# Patient Record
Sex: Female | Born: 1970 | Race: Black or African American | Hispanic: No | Marital: Single | Smoking: Former smoker
Health system: Southern US, Community
[De-identification: ages and names within clinical notes are randomized; demographics above are authoritative.]

## PROBLEM LIST (undated history)

## (undated) DIAGNOSIS — I10 Essential (primary) hypertension: Secondary | ICD-10-CM

## (undated) DIAGNOSIS — N898 Other specified noninflammatory disorders of vagina: Secondary | ICD-10-CM

## (undated) DIAGNOSIS — N949 Unspecified condition associated with female genital organs and menstrual cycle: Secondary | ICD-10-CM

## (undated) DIAGNOSIS — R87629 Unspecified abnormal cytological findings in specimens from vagina: Secondary | ICD-10-CM

## (undated) DIAGNOSIS — N200 Calculus of kidney: Secondary | ICD-10-CM

## (undated) DIAGNOSIS — F419 Anxiety disorder, unspecified: Secondary | ICD-10-CM

## (undated) DIAGNOSIS — F99 Mental disorder, not otherwise specified: Secondary | ICD-10-CM

## (undated) DIAGNOSIS — F32A Depression, unspecified: Secondary | ICD-10-CM

## (undated) DIAGNOSIS — N760 Acute vaginitis: Secondary | ICD-10-CM

## (undated) DIAGNOSIS — D219 Benign neoplasm of connective and other soft tissue, unspecified: Secondary | ICD-10-CM

## (undated) DIAGNOSIS — B9689 Other specified bacterial agents as the cause of diseases classified elsewhere: Secondary | ICD-10-CM

## (undated) DIAGNOSIS — E785 Hyperlipidemia, unspecified: Secondary | ICD-10-CM

## (undated) DIAGNOSIS — N921 Excessive and frequent menstruation with irregular cycle: Secondary | ICD-10-CM

## (undated) DIAGNOSIS — F339 Major depressive disorder, recurrent, unspecified: Secondary | ICD-10-CM

## (undated) HISTORY — DX: Unspecified abnormal cytological findings in specimens from vagina: R87.629

## (undated) HISTORY — DX: Other specified noninflammatory disorders of vagina: N89.8

## (undated) HISTORY — DX: Acute vaginitis: N76.0

## (undated) HISTORY — PX: OTHER SURGICAL HISTORY: SHX169

## (undated) HISTORY — DX: Depression, unspecified: F32.A

## (undated) HISTORY — DX: Mental disorder, not otherwise specified: F99

## (undated) HISTORY — DX: Excessive and frequent menstruation with irregular cycle: N92.1

## (undated) HISTORY — DX: Unspecified condition associated with female genital organs and menstrual cycle: N94.9

## (undated) HISTORY — DX: Calculus of kidney: N20.0

## (undated) HISTORY — DX: Other specified bacterial agents as the cause of diseases classified elsewhere: B96.89

## (undated) HISTORY — DX: Benign neoplasm of connective and other soft tissue, unspecified: D21.9

## (undated) HISTORY — DX: Hyperlipidemia, unspecified: E78.5

## (undated) HISTORY — DX: Anxiety disorder, unspecified: F41.9

## (undated) HISTORY — DX: Essential (primary) hypertension: I10

---
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
## 2005-08-27 HISTORY — PX: BREAST REDUCTION SURGERY: SHX8

## 2005-10-12 ENCOUNTER — Emergency Department (HOSPITAL_COMMUNITY): Admission: EM | Admit: 2005-10-12 | Discharge: 2005-10-12 | Payer: Self-pay | Admitting: Emergency Medicine

## 2007-09-30 ENCOUNTER — Encounter: Payer: Self-pay | Admitting: Family Medicine

## 2007-10-09 ENCOUNTER — Encounter: Payer: Self-pay | Admitting: Family Medicine

## 2008-08-27 HISTORY — PX: CERVICAL BIOPSY: SHX590

## 2008-10-28 ENCOUNTER — Encounter: Payer: Self-pay | Admitting: Family Medicine

## 2008-12-09 ENCOUNTER — Encounter: Payer: Self-pay | Admitting: Family Medicine

## 2009-02-22 ENCOUNTER — Other Ambulatory Visit: Admission: RE | Admit: 2009-02-22 | Discharge: 2009-02-22 | Payer: Self-pay | Admitting: Nurse Practitioner

## 2009-02-22 ENCOUNTER — Encounter: Payer: Self-pay | Admitting: Family Medicine

## 2009-02-22 ENCOUNTER — Other Ambulatory Visit: Admission: RE | Admit: 2009-02-22 | Discharge: 2009-02-22 | Payer: Self-pay | Admitting: Unknown Physician Specialty

## 2009-06-06 ENCOUNTER — Ambulatory Visit (HOSPITAL_COMMUNITY): Admission: RE | Admit: 2009-06-06 | Discharge: 2009-06-06 | Payer: Self-pay | Admitting: Family Medicine

## 2009-06-06 ENCOUNTER — Ambulatory Visit: Payer: Self-pay | Admitting: Family Medicine

## 2009-06-06 DIAGNOSIS — G4700 Insomnia, unspecified: Secondary | ICD-10-CM | POA: Insufficient documentation

## 2009-06-06 DIAGNOSIS — K5900 Constipation, unspecified: Secondary | ICD-10-CM | POA: Insufficient documentation

## 2009-06-06 DIAGNOSIS — F17200 Nicotine dependence, unspecified, uncomplicated: Secondary | ICD-10-CM | POA: Insufficient documentation

## 2009-06-06 DIAGNOSIS — M549 Dorsalgia, unspecified: Secondary | ICD-10-CM | POA: Insufficient documentation

## 2009-06-06 DIAGNOSIS — M79609 Pain in unspecified limb: Secondary | ICD-10-CM | POA: Insufficient documentation

## 2009-06-30 ENCOUNTER — Encounter: Payer: Self-pay | Admitting: Family Medicine

## 2009-07-27 ENCOUNTER — Ambulatory Visit: Payer: Self-pay | Admitting: Family Medicine

## 2009-07-27 DIAGNOSIS — K219 Gastro-esophageal reflux disease without esophagitis: Secondary | ICD-10-CM | POA: Insufficient documentation

## 2009-07-27 DIAGNOSIS — J309 Allergic rhinitis, unspecified: Secondary | ICD-10-CM | POA: Insufficient documentation

## 2009-08-07 DIAGNOSIS — J019 Acute sinusitis, unspecified: Secondary | ICD-10-CM | POA: Insufficient documentation

## 2009-08-23 ENCOUNTER — Telehealth: Payer: Self-pay | Admitting: Family Medicine

## 2009-09-01 ENCOUNTER — Encounter: Payer: Self-pay | Admitting: Family Medicine

## 2009-11-15 ENCOUNTER — Other Ambulatory Visit: Admission: RE | Admit: 2009-11-15 | Discharge: 2009-11-15 | Payer: Self-pay | Admitting: Family Medicine

## 2009-11-15 ENCOUNTER — Ambulatory Visit: Payer: Self-pay | Admitting: Family Medicine

## 2009-11-15 LAB — CONVERTED CEMR LAB: Pap Smear: NEGATIVE

## 2009-11-16 ENCOUNTER — Encounter: Payer: Self-pay | Admitting: Physician Assistant

## 2009-11-16 LAB — CONVERTED CEMR LAB
Candida species: NEGATIVE
Gardnerella vaginalis: POSITIVE — AB

## 2009-11-17 ENCOUNTER — Encounter: Payer: Self-pay | Admitting: Physician Assistant

## 2009-11-17 ENCOUNTER — Encounter: Payer: Self-pay | Admitting: Family Medicine

## 2009-11-26 ENCOUNTER — Emergency Department (HOSPITAL_COMMUNITY): Admission: EM | Admit: 2009-11-26 | Discharge: 2009-11-26 | Payer: Self-pay | Admitting: Emergency Medicine

## 2009-11-30 ENCOUNTER — Encounter: Payer: Self-pay | Admitting: Family Medicine

## 2009-11-30 DIAGNOSIS — E785 Hyperlipidemia, unspecified: Secondary | ICD-10-CM | POA: Insufficient documentation

## 2009-12-28 ENCOUNTER — Ambulatory Visit: Payer: Self-pay | Admitting: Family Medicine

## 2009-12-29 ENCOUNTER — Encounter: Payer: Self-pay | Admitting: Family Medicine

## 2010-01-09 ENCOUNTER — Ambulatory Visit: Payer: Self-pay | Admitting: Family Medicine

## 2010-01-09 DIAGNOSIS — H1045 Other chronic allergic conjunctivitis: Secondary | ICD-10-CM | POA: Insufficient documentation

## 2010-02-10 ENCOUNTER — Ambulatory Visit: Payer: Self-pay | Admitting: Family Medicine

## 2010-03-07 ENCOUNTER — Ambulatory Visit: Payer: Self-pay | Admitting: Family Medicine

## 2010-03-07 DIAGNOSIS — H6690 Otitis media, unspecified, unspecified ear: Secondary | ICD-10-CM | POA: Insufficient documentation

## 2010-03-21 ENCOUNTER — Telehealth: Payer: Self-pay | Admitting: Family Medicine

## 2010-04-02 ENCOUNTER — Emergency Department (HOSPITAL_COMMUNITY): Admission: EM | Admit: 2010-04-02 | Discharge: 2010-04-03 | Payer: Self-pay | Admitting: Emergency Medicine

## 2010-04-06 ENCOUNTER — Ambulatory Visit: Payer: Self-pay | Admitting: Family Medicine

## 2010-04-06 DIAGNOSIS — L03319 Cellulitis of trunk, unspecified: Secondary | ICD-10-CM

## 2010-04-06 DIAGNOSIS — L02219 Cutaneous abscess of trunk, unspecified: Secondary | ICD-10-CM | POA: Insufficient documentation

## 2010-06-16 ENCOUNTER — Telehealth: Payer: Self-pay | Admitting: Family Medicine

## 2010-07-05 ENCOUNTER — Telehealth: Payer: Self-pay | Admitting: Family Medicine

## 2010-07-31 ENCOUNTER — Ambulatory Visit: Payer: Self-pay | Admitting: Family Medicine

## 2010-08-02 ENCOUNTER — Encounter: Payer: Self-pay | Admitting: Family Medicine

## 2010-08-14 ENCOUNTER — Telehealth: Payer: Self-pay | Admitting: Family Medicine

## 2010-09-07 ENCOUNTER — Ambulatory Visit
Admission: RE | Admit: 2010-09-07 | Discharge: 2010-09-07 | Payer: Self-pay | Source: Home / Self Care | Attending: Family Medicine | Admitting: Family Medicine

## 2010-09-07 DIAGNOSIS — R51 Headache: Secondary | ICD-10-CM | POA: Insufficient documentation

## 2010-09-07 DIAGNOSIS — R519 Headache, unspecified: Secondary | ICD-10-CM | POA: Insufficient documentation

## 2010-09-11 ENCOUNTER — Ambulatory Visit: Admit: 2010-09-11 | Payer: Self-pay | Admitting: Family Medicine

## 2010-09-12 ENCOUNTER — Other Ambulatory Visit: Payer: Self-pay | Admitting: Obstetrics and Gynecology

## 2010-09-26 NOTE — Assessment & Plan Note (Signed)
Summary: EYE SWOLLEN- room 2   Vital Signs:  Patient profile:   40 year old female Menstrual status:  irregular, implenon Height:      61 inches Weight:      124 pounds BMI:     23.51 O2 Sat:      99 % on Room air Pulse rate:   102 / minute Resp:     16 per minute BP sitting:   108 / 76  (left arm)  Vitals Entered By: Adella Hare LPN (Jan 09, 2010 10:53 AM) CC: right eye swollen and itchy Is Patient Diabetic? No Pain Assessment Patient in pain? no        Primary Provider:  Syliva Overman MD  CC:  right eye swollen and itchy.  History of Present Illness: Pt is here today stating that she developed some itching and watering of her Rt eye yesterday.  Her upper & lower lids were itchy too and a litle red.  She used saline eye gtt and when awoke this am is looking better.  No mucus or crusting. She has a hx of seasonal allergies.  Is not using her Loratadine.  Does have nasal congestion due to her allergies.  Is worse at HS.  Current Medications (verified): 1)  Implanon 68 Mg Impl (Etonogestrel) .... Inserted 07/07/2007 Removal 07/06/2010 Left Arm 2)  Ibuprofen 800 Mg Tabs (Ibuprofen) .... Take 1 Tablet By Mouth Three Times A Day 3)  Ranitidine Hcl 300 Mg Tabs (Ranitidine Hcl) .... Take 1 Tablet By Mouth Once A Day 4)  Claritin 10 Mg Tabs (Loratadine) .... Take 1 Tablet By Mouth Once A Day  Allergies (verified): No Known Drug Allergies  Past History:  Past medical history reviewed for relevance to current acute and chronic problems.  Past Medical History: Reviewed history from 06/06/2009 and no changes required. Hyperlipidemia x 4 years HTN x when she was young but she changed diet and reduced salt intake abnormal pap  Review of Systems General:  Denies chills and fever. Eyes:  Complains of itching; denies blurring, discharge, double vision, and eye pain. ENT:  Complains of nasal congestion; denies earache, postnasal drainage, sinus pressure, and sore  throat. Allergy:  Complains of itching eyes, seasonal allergies, and sneezing.  Physical Exam  General:  Well-developed,well-nourished,in no acute distress; alert,appropriate and cooperative throughout examination Head:  Normocephalic and atraumatic without obvious abnormalities. No apparent alopecia or balding. Eyes:  vision grossly intact, pupils equal, pupils round, pupils reactive to light, corneas and lenses clear, and no injection.   Ears:  External ear exam shows no significant lesions or deformities.  Otoscopic examination reveals clear canals, tympanic membranes are intact bilaterally without bulging, retraction, inflammation or discharge. Hearing is grossly normal bilaterally. Nose:  no external deformity.  Nasal turbs mildly swollen and bluish. Mouth:  Oral mucosa and oropharynx without lesions or exudates.  Teeth in good repair. Neck:  No deformities, masses, or tenderness noted. Lungs:  Normal respiratory effort, chest expands symmetrically. Lungs are clear to auscultation, no crackles or wheezes. Heart:  Normal rate and regular rhythm. S1 and S2 normal without gallop, murmur, click, rub or other extra sounds. Cervical Nodes:  No lymphadenopathy noted Psych:  Cognition and judgment appear intact. Alert and cooperative with normal attention span and concentration. No apparent delusions, illusions, hallucinations   Impression & Recommendations:  Problem # 1:  CONJUNCTIVITIS, ALLERGIC, ACUTE (ICD-372.14) Assessment New Discussed with pt that this was most likely due to her allergies.  If she develops mucus  dischg or crusting then to start antibiotic eye gtt prescription.  Her updated medication list for this problem includes:    Sulfacetamide Sodium 10 % Soln (Sulfacetamide sodium) ..... Instill 2 drops into rt eye three times a day x 7 days  Problem # 2:  ALLERGIC RHINITIS CAUSE UNSPECIFIED (ICD-477.9) Assessment: Unchanged  Her updated medication list for this problem  includes:    Claritin 10 Mg Tabs (Loratadine) .Marland Kitchen... Take 1 tablet by mouth once a day  Complete Medication List: 1)  Implanon 68 Mg Impl (Etonogestrel) .... Inserted 07/07/2007 removal 07/06/2010 left arm 2)  Ibuprofen 800 Mg Tabs (Ibuprofen) .... Take 1 tablet by mouth three times a day 3)  Ranitidine Hcl 300 Mg Tabs (Ranitidine hcl) .... Take 1 tablet by mouth once a day 4)  Claritin 10 Mg Tabs (Loratadine) .... Take 1 tablet by mouth once a day 5)  Sulfacetamide Sodium 10 % Soln (Sulfacetamide sodium) .... Instill 2 drops into rt eye three times a day x 7 days  Patient Instructions: 1)  Please schedule a follow-up appointment as needed. 2)  Use your Claritin for allergies. 3)  You may continue to use saline eye drops as needed.  There is also an over the counter allergy eye drop called Zaditor which may help. 4)  If you develop mucusy discharge or crusting then fill and begin using the prescription antibiotic eye drop. Prescriptions: SULFACETAMIDE SODIUM 10 % SOLN (SULFACETAMIDE SODIUM) instill 2 drops into Rt eye three times a day x 7 days  #5 ml x 0   Entered and Authorized by:   Esperanza Sheets PA   Signed by:   Esperanza Sheets PA on 01/09/2010   Method used:   Print then Give to Patient   RxID:   [id number]

## 2010-09-26 NOTE — Letter (Signed)
Summary: Laboratory/X-Ray Results  [facility]  [address]   [place], [place] [address]   Phone: [phone]  Fax: [phone]    Lab/X-Ray Results  November 17, 2009  MRN: [id number]  [facility] [address] [place], [place]  [address]    The results of your recent lab/x-ray has been reviewed and were found:      Your pap smear was normal.      Once I review your previous records I will be able to inform you if you need to have another           pap in 6 months, or if you can wait 1 year.      If you have any questions, please contact our office.     Esperanza Sheets PA  Appended Document: Laboratory/X-Ray Results Phone call to pt. Discussed with her that I have received & reviewed her prev pap & colpo/path results. According to the current ASCCP guidelines she should have a pap at 6 & 12 mos.  And does not need another colpo at this time.   Pt will call back to sched another pap in 6 mos.  If this one is also nl, will resume routine screening yearly.

## 2010-09-26 NOTE — Letter (Signed)
Summary: TB Skin Test  All     ,     Phone:   Fax:           TB Skin Test       [name] [name]    DOB: 2071/04/04        Vaccine Type: PPD     Placed 11/15/09       Site: left forearm    Mfr: Sanofi Pasteur    Dose: 0.1 ml    Route: ID    Given by: Adella Hare LPN    Exp. Date: 5/13    Lot #: [id number]         Result: 0mm, neg    Adella Hare LPN  November 17, 2009 8:46 AM

## 2010-09-26 NOTE — Assessment & Plan Note (Signed)
Summary: itchy swollen eyes- room 2   Vital Signs:  Patient profile:   40 year old female Menstrual status:  irregular, implenon Height:      61 inches Weight:      123.75 pounds BMI:     23.47 O2 Sat:      100 % on Room air Pulse rate:   78 / minute Resp:     16 per minute BP sitting:   106 / 70  (left arm)  Vitals Entered By: Adella Hare LPN (February 10, 2010 8:56 AM) CC: itchy swollen eyes Is Patient Diabetic? No Pain Assessment Patient in pain? no        Primary Provider:  Syliva Overman MD  CC:  itchy swollen eyes.  History of Present Illness: Pt presents today with itchy swollen eyelids bilat. Started again yest.  She was seen 18m os ago with the same.  No mucus or crusting.  Swelling is less in  am when awakens and worsens as the day goes on. l She is rubbing her eyes because of the itching. She is taking an allergy pill daily.  She is uncertain the name of it. She did not try Zaditor eye gtts as prev recommended.  She denies nasal congestion or sneezing.   Current Medications (verified): 1)  Implanon 68 Mg Impl (Etonogestrel) .... Inserted 07/07/2007 Removal 07/06/2010 Left Arm 2)  Ibuprofen 800 Mg Tabs (Ibuprofen) .... Take 1 Tablet By Mouth Three Times A Day 3)  Ranitidine Hcl 300 Mg Tabs (Ranitidine Hcl) .... Take 1 Tablet By Mouth Once A Day 4)  Claritin 10 Mg Tabs (Loratadine) .... Take 1 Tablet By Mouth Once A Day 5)  Sulfacetamide Sodium 10 % Soln (Sulfacetamide Sodium) .... Instill 2 Drops Into Rt Eye Three Times A Day X 7 Days  Allergies (verified): No Known Drug Allergies  Past History:  Past medical history reviewed for relevance to current acute and chronic problems.  Past Medical History: Reviewed history from 06/06/2009 and no changes required. Hyperlipidemia x 4 years HTN x when she was young but she changed diet and reduced salt intake abnormal pap  Review of Systems General:  Denies chills and fever. Eyes:  Denies blurring, discharge,  double vision, and eye pain. ENT:  Denies earache, nasal congestion, and sore throat. Allergy:  Complains of seasonal allergies.  Physical Exam  General:  Well-developed,well-nourished,in no acute distress; alert,appropriate and cooperative throughout examination Head:  Normocephalic and atraumatic without obvious abnormalities. No apparent alopecia or balding. Eyes:  pupils equal, pupils round, pupils reactive to light, corneas and lenses clear, and no injection.  Bilat upper and lower lids mildy swollen without erythema.  No hordelums visible or palpable. Ears:  External ear exam shows no significant lesions or deformities.  Otoscopic examination reveals clear canals, tympanic membranes are intact bilaterally without bulging, retraction, inflammation or discharge. Hearing is grossly normal bilaterally. Nose:  External nasal examination shows no deformity or inflammation. Nasal mucosa are pink and moist without lesions or exudates. Mouth:  Oral mucosa and oropharynx without lesions or exudates.  Teeth in good repair. Neck:  No deformities, masses, or tenderness noted. Lungs:  Normal respiratory effort, chest expands symmetrically. Lungs are clear to auscultation, no crackles or wheezes. Heart:  Normal rate and regular rhythm. S1 and S2 normal without gallop, murmur, click, rub or other extra sounds. Cervical Nodes:  No lymphadenopathy noted Psych:  Cognition and judgment appear intact. Alert and cooperative with normal attention span and concentration. No apparent delusions,  illusions, hallucinations   Impression & Recommendations:  Problem # 1:  CONJUNCTIVITIS, ALLERGIC, ACUTE (ICD-372.14) Assessment Deteriorated  Advised to try to not rub her eyes.  To use cool compresses to help with the itching and swelling. Continue allergy pill daily.  Rxd Patanol opth gtts.    The following medications were removed from the medication list:    Sulfacetamide Sodium 10 % Soln (Sulfacetamide sodium)  ..... Instill 2 drops into rt eye three times a day x 7 days  Orders: Depo- Medrol 80mg  (J1040) Admin of Therapeutic Inj  intramuscular or subcutaneous ([address])  Complete Medication List: 1)  Implanon 68 Mg Impl (Etonogestrel) .... Inserted 07/07/2007 removal 07/06/2010 left arm 2)  Ibuprofen 800 Mg Tabs (Ibuprofen) .... Take 1 tablet by mouth three times a day 3)  Ranitidine Hcl 300 Mg Tabs (Ranitidine hcl) .... Take 1 tablet by mouth once a day 4)  Claritin 10 Mg Tabs (Loratadine) .... Take 1 tablet by mouth once a day 5)  Patanol 0.1 % Soln (Olopatadine hcl) .... Use 1 drop in each eye two times a day as needed for allergies  Patient Instructions: 1)  Please schedule a follow-up appointment as needed. 2)  I have prescribed Patanol eye drops to use as needed. If it is expensive then try Zaditor eye drops over the counter. 3)  Use cool compresses to your eyes as needed. 4)  Continue taking allergy pill daily. 5)  You have received a shot of Depo Medrol in the office to help with your allergies. Prescriptions: PATANOL 0.1 % SOLN (OLOPATADINE HCL) use 1 drop in each eye two times a day as needed for allergies  #1 bottle x 1   Entered and Authorized by:   Esperanza Sheets PA   Signed by:   Esperanza Sheets PA on 02/10/2010   Method used:   Electronically to        [facility]  [address] (retail)       [address]       [place], [place]  [address]       Ph: [phone]       Fax: [phone]   RxID:   [id number]    Medication Administration  Injection # 1:    Medication: Depo- Medrol 80mg     Diagnosis: CONJUNCTIVITIS, ALLERGIC, ACUTE (ICD-372.14)    Route: IM    Site: L deltoid    Exp Date: 2/12    Lot #: [id number]    Mfr: Pharmacia    Patient tolerated injection without complications    Given by: Adella Hare LPN (February 10, 2010 9:25 AM)  Orders Added: 1)  Est. Patient Level III [27253] 2)  Depo- Medrol 80mg  [J1040] 3)  Admin of Therapeutic Inj  intramuscular  or subcutaneous [66440]

## 2010-09-26 NOTE — Assessment & Plan Note (Signed)
Summary: shot  Nurse Visit   Allergies: No Known Drug Allergies  Immunizations Administered:  Tetanus Vaccine:    Vaccine Type: Tdap    Site: right deltoid    Mfr: GlaxoSmithKline    Dose: 0.5 ml    Route: IM    Given by: Adella Hare LPN    Exp. Date: 11/19/2011    Lot #: [id number]    VIS given: 07/15/07 version given Dec 28, 2009.  Orders Added: 1)  Tdap => 23yrs IM [90715] 2)  Admin 1st Vaccine [09811]

## 2010-09-26 NOTE — Letter (Signed)
Summary: Out of [facility]  [address]   [place], [place] [address]   Phone: [phone]  Fax: [phone]    July 31, 2010   Student:  Neta Mends Herrmann    To Whom It May Concern:   For Medical reasons, please excuse the above named student from school for the following dates:  Start:   July 31, 2010  End:    August 01, 2010  If you need additional information, please feel free to contact our office.   Sincerely,    Milus Mallick. Lodema Hong, MD    ****This is a legal document and cannot be tampered with.  Schools are authorized to verify all information and to do so accordingly.

## 2010-09-26 NOTE — Assessment & Plan Note (Signed)
Summary: OV   Vital Signs:  Patient profile:   40 year old female Menstrual status:  irregular, implenon Height:      61 inches Weight:      123.25 pounds BMI:     23.37 O2 Sat:      98 % Pulse rate:   89 / minute Pulse rhythm:   regular Resp:     16 per minute BP sitting:   110 / 70  (left arm) Cuff size:   regular  Vitals Entered By: Everitt Amber LPN (March 07, 2010 3:53 PM) CC: Follow up chronic problems   Primary Care Provider:  Syliva Overman MD  CC:  Follow up chronic problems.  History of Present Illness: 1 week h/o increeased sinus headache wioth pressure over right cheek and forehead, inc post nasal drainage, right ear pain, no fever or chills. Prior to this she has been doing well with noi concerns  Current Medications (verified): 1)  Implanon 68 Mg Impl (Etonogestrel) .... Inserted 07/07/2007 Removal 07/06/2010 Left Arm 2)  Ibuprofen 800 Mg Tabs (Ibuprofen) .... Take 1 Tablet By Mouth Three Times A Day 3)  Ranitidine Hcl 300 Mg Tabs (Ranitidine Hcl) .... Take 1 Tablet By Mouth Once A Day 4)  Claritin 10 Mg Tabs (Loratadine) .... 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 Eyes:  Denies blurring and discharge. ENT:  Complains of earache, postnasal drainage, and sinus pressure; denies decreased hearing, nasal congestion, and sore throat. CV:  Denies chest pain or discomfort, difficulty breathing while lying down, palpitations, and swelling of feet. Resp:  Denies cough, shortness of breath, and sputum productive. GI:  Denies abdominal pain, constipation, diarrhea, nausea, and  vomiting. GU:  Denies dysuria and urinary frequency. MS:  Denies joint pain and stiffness. Psych:  Denies anxiety and depression. Endo:  Denies cold intolerance, excessive hunger, excessive thirst, excessive urination, heat intolerance, polyuria, and weight change. Heme:  Denies abnormal bruising and bleeding. Allergy:  Denies hives or rash and itching eyes.  Physical Exam  General:  Well-developed,well-nourished,in no acute distress; alert,appropriate and cooperative throughout examination HEENT: No facial asymmetry,  EOMI,frontal and maxillary sinus tenderness, TM's dull, oropharynx  pink and moist.   Chest: Clear to auscultation bilaterally.  CVS: S1, S2, No murmurs, No S3.   Abd: Soft, Nontender.  MS: Adequate ROM spine, hips, shoulders and knees.  Ext: No edema.   CNS: CN 2-12 intact, power tone and sensation normal throughout.   Skin: Intact, no visible lesions or rashes.  Psych: Good eye contact, normal affect.  Memory intact, not anxious or depressed appearing.    Impression & Recommendations:  Problem # 1:  ACUTE MAXILLARY SINUSITIS (ICD-461.0) Assessment Comment Only  Her updated medication list for this problem includes:    Penicillin V Potassium 500 Mg Tabs (Penicillin v potassium) .Marland Kitchen... Take 1 tablet by mouth three times a day  Problem # 2:  OTITIS MEDIA (ICD-382.9) Assessment: Comment Only  Her updated medication list for this problem includes:    Ibuprofen 800 Mg Tabs (Ibuprofen) .Marland Kitchen... Take 1 tablet by mouth three times a day    Penicillin V Potassium 500 Mg  Tabs (Penicillin v potassium) .Marland Kitchen... Take 1 tablet by mouth three times a day  Problem # 3:  HYPERLIPIDEMIA (ICD-272.4)  Complete Medication List: 1)  Implanon 68 Mg Impl (Etonogestrel) .... Inserted 07/07/2007 removal 07/06/2010 left arm 2)  Ibuprofen 800 Mg Tabs (Ibuprofen) .... Take 1 tablet by mouth three times a day 3)  Ranitidine Hcl 300 Mg Tabs (Ranitidine hcl) .... Take 1 tablet by mouth once a  day 4)  Claritin 10 Mg Tabs (Loratadine) .... Take 1 tablet by mouth once a day 5)  Penicillin V Potassium 500 Mg Tabs (Penicillin v potassium) .... Take 1 tablet by mouth three times a day  Patient Instructions: 1)  f/u as needed. 2)  cPE due next March. 3)  med is sent in for sinusitis and ear infection Prescriptions: PENICILLIN V POTASSIUM 500 MG TABS (PENICILLIN V POTASSIUM) Take 1 tablet by mouth three times a day  #30 x 0   Entered and Authorized by:   Syliva Overman MD   Signed by:   Syliva Overman MD on 03/07/2010   Method used:   Electronically to        [facility]  [address] (retail)       [address]       [place], [place]  [address]       Ph: [phone]       Fax: [phone]   RxID:   [id number]

## 2010-09-26 NOTE — Letter (Signed)
Summary: TB Skin Test  [facility]  [address]   [place], [place] [address]   Phone: [phone]  Fax: [phone]          TB Skin Test       [name] [name]    DOB: Apr 04, 2071        Vaccine Type: PPD     Placed 11/15/09       Site: left forearm    Mfr: Sanofi Pasteur    Dose: 0.1 ml    Route: ID    Given by: Adella Hare LPN    Exp. Date: 5/13

## 2010-09-26 NOTE — Letter (Signed)
Summary: HEALTH EXAMINATION CERTIFICATE  HEALTH EXAMINATION CERTIFICATE   Imported By: Lind Guest 12/29/2009 10:33:46  _____________________________________________________________________  External Attachment:    Type:   Image     Comment:   External Document

## 2010-09-26 NOTE — Miscellaneous (Signed)
Summary: Immunizations  Immunizations   Imported By: Lind Guest 06/30/2009 13:06:02  _____________________________________________________________________  External Attachment:    Type:   Image     Comment:   External Document

## 2010-09-26 NOTE — Miscellaneous (Signed)
  Clinical Lists Changes  Problems: Added new problem of HYPERLIPIDEMIA (ICD-272.4) 

## 2010-09-26 NOTE — Progress Notes (Signed)
Summary: had someone to pass  Phone Note Call from Patient   Summary of Call: rse. had someone to pass and having alot of trouble. [phone] Initial call taken by: Rudene Anda,  August 23, 2009 9:45 AM  Follow-up for Phone Call        pls casll advise sorry to hear, what do they want, is this asking for an appt or what, I am not clear, pls address if able Follow-up by: Syliva Overman MD,  August 23, 2009 10:57 AM  Additional Follow-up for Phone Call Additional follow up Details #1::        returned call, left message Additional Follow-up by: Worthy Keeler LPN,  August 23, 2009 11:14 AM    Additional Follow-up for Phone Call Additional follow up Details #2::    patient states she had someone close to her pass, she reports increased insomnia and anxiety  [facility]. Follow-up by: Worthy Keeler LPN,  August 23, 2009 11:16 AM  Additional Follow-up for Phone Call Additional follow up Details #3:: Details for Additional Follow-up Action Taken: pls advise and counsel on sleep hygiene, , ask about her support system encourage her to understand grief is normal and need to give it time to pass, I recommend OTC benadryl to help with sleep Additional Follow-up by: Syliva Overman MD,  August 23, 2009 12:20 PM  returned call, left message patient aware Worthy Keeler LPN  August 23, 2009 3:48 PM

## 2010-09-26 NOTE — Letter (Signed)
Summary: [facility]  [facility] HE[facility] DEPT   Imported By: Lind Guest 11/16/2009 11:43:42  _____________________________________________________________________  External Attachment:    Type:   Image     Comment:   External Document

## 2010-09-26 NOTE — Letter (Signed)
Summary: MEDICAL RELEASE  MEDICAL RELEASE   Imported By: Lind Guest 06/09/2009 09:50:28  _____________________________________________________________________  External Attachment:    Type:   Image     Comment:   External Document

## 2010-09-26 NOTE — Assessment & Plan Note (Signed)
Summary: HEARTBURN AND SINSUS   Vital Signs:  Patient profile:   40 year old female Menstrual status:  irregular, implenon Height:      61 inches Weight:      131 pounds BMI:     24.84 O2 Sat:      93 % Pulse rate:   77 / minute Pulse rhythm:   regular Resp:     16 per minute BP sitting:   118 / 82 Cuff size:   regular  Vitals Entered By: Everitt Amber (July 27, 2009 3:09 PM) CC: Having alot of heartburn and gas after she eats, hurts everytime she eats something. If she tries to belch, acid will come up and burn Is Patient Diabetic? No Pain Assessment Patient in pain? no        Primary Care Provider:  Syliva Overman MD  CC:  Having alot of heartburn and gas after she eats, hurts everytime she eats something. If she tries to belch, and acid will come up and burn.  History of Present Illness: Reports  that she has been doing failrly well Denies recent fever or chills. Reports  sinus pressure, nasal congestion ,and denies  ear pain or sore throat. Denies chest congestion, or cough productive of sputum. Denies chest pain, palpitations, PND, orthopnea or leg swelling. Denies abdominal pain, nausea, vomitting, diarrhea or constipation. Denies change in bowel movements or bloody stool. Denies dysuria , frequency, incontinence or hesitancy. Reports chronic back pain, withr reduced mobility.Denies joint swelling Denies headaches, vertigo, seizures. Denies depression, anxiety or insomnia. Denies  rash, lesions, or itch.     Preventive Screening-Counseling & Management  Alcohol-Tobacco     Smoking Status: quit  Allergies: No Known Drug Allergies  Social History: Smoking Status:  quit  Review of Systems      See HPI General:  Complains of fatigue; denies chills, fever, and sleep disorder. Eyes:  Denies discharge, itching, and red eye. ENT:  Complains of sinus pressure; right maxillary sinus pressure , woozy, off balance. CV:  Denies chest pain or discomfort,  palpitations, and swelling of feet. Resp:  Denies cough and sputum productive. GI:  Complains of abdominal pain; epigastric pain with reflux symptoms worse over the last 3 months, excessive belching. GU:  Denies dysuria and urinary frequency. MS:  Complains of low back pain; back pain improved less frequent and less severe, responds to ibuprofen,no flexeril used ,makes her light headed. Derm:  Denies dryness, itching, lesion(s), and rash. Neuro:  Complains of headaches and sensation of room spinning; denies brief paralysis and seizures. Psych:  Denies anxiety and depression. Endo:  Denies cold intolerance, excessive hunger, excessive thirst, excessive urination, heat intolerance, polyuria, and weight change. Heme:  Denies abnormal bruising and bleeding. Allergy:  Denies hives or rash, itching eyes, and sneezing.  Physical Exam  General:  Well-developed,well-nourished,in no acute distress; alert,appropriate and cooperative throughout examination. HEENT: No facial asymmetry,  EOMI, maxillary sinus tenderness, TM's Clear, oropharynx  pink and moist.   Chest: Clear to auscultation bilaterally. Decreased air ntry throughout. CVS: S1, S2, No murmurs, No S3.   Abd: Soft, Nontender.  MS: decreased  ROM spine,adequate in  hips, shoulders and knees.  Ext: No edema.   CNS: CN 2-12 intact, power tone and sensation normal throughout.   Skin: Intact, no visible lesions or rashes.  Psych: Good eye contact, normal affect.  Memory intact, not anxious or depressed appearing.    Impression & Recommendations:  Problem # 1:  ALLERGIC RHINITIS CAUSE  UNSPECIFIED (ICD-477.9) Assessment Deteriorated  Her updated medication list for this problem includes:    Claritin 10 Mg Tabs (Loratadine) .Marland Kitchen... Take 1 tablet by mouth once a day  Orders: Depo- Medrol 80mg  (J1040) Admin of Therapeutic Inj  intramuscular or subcutaneous (04540)  Problem # 2:  GERD (ICD-530.81) Assessment: Deteriorated  Her updated  medication list for this problem includes:    Ranitidine Hcl 300 Mg Tabs (Ranitidine hcl) .Marland Kitchen... Take 1 tablet by mouth once a day  Problem # 3:  NICOTINE ADDICTION (ICD-305.1) Assessment: Unchanged  Encouraged smoking cessation and discussed different methods for smoking cessation.   Problem # 4:  BACK PAIN (ICD-724.5) Assessment: Unchanged  The following medications were removed from the medication list:    Cyclobenzaprine Hcl 10 Mg Tabs (Cyclobenzaprine hcl) .Marland Kitchen... Take 1 tab by mouth at bedtime as needed Her updated medication list for this problem includes:    Ibuprofen 800 Mg Tabs (Ibuprofen) .Marland Kitchen... Take 1 tablet by mouth three times a day  Problem # 5:  OTHER ACUTE SINUSITIS (ICD-461.8) Assessment: Comment Only  Her updated medication list for this problem includes:    Septra Ds 800-160 Mg Tabs (Sulfamethoxazole-trimethoprim) .Marland Kitchen... Take 1 tablet by mouth two times a day  Complete Medication List: 1)  Implanon 68 Mg Impl (Etonogestrel) .... Inserted 07/07/2007 removal 07/06/2010 left arm 2)  Ibuprofen 800 Mg Tabs (Ibuprofen) .... Take 1 tablet by mouth three times a day 3)  Ranitidine Hcl 300 Mg Tabs (Ranitidine hcl) .... Take 1 tablet by mouth once a day 4)  Septra Ds 800-160 Mg Tabs (Sulfamethoxazole-trimethoprim) .... Take 1 tablet by mouth two times a day 5)  Claritin 10 Mg Tabs (Loratadine) .... Take 1 tablet by mouth once a day  Patient Instructions: 1)  cPE in January. 2)    3)  Meds are sent in as discussed. 4)  You will get an injection today for uncontrolled allergies Prescriptions: IBUPROFEN 800 MG TABS (IBUPROFEN) Take 1 tablet by mouth three times a day  #30 x 2   Entered by:   Worthy Keeler LPN   Authorized by:   Syliva Overman MD   Signed by:   Worthy Keeler LPN on 98/06/9146   Method used:   Electronically to        [facility]  [address] (retail)       [address]       [place], [place]  [address]       Ph: [phone]       Fax:  [phone]   RxID:   [id number] CLARITIN 10 MG TABS (LORATADINE) Take 1 tablet by mouth once a day  #30 x 2   Entered and Authorized by:   Syliva Overman MD   Signed by:   Syliva Overman MD on 07/27/2009   Method used:   Electronically to        [facility]  [address] (retail)       [address]       [place], [place]  [address]       Ph: [phone]       Fax: [phone]   RxID:   [id number] SEPTRA DS 800-160 MG TABS (SULFAMETHOXAZOLE-TRIMETHOPRIM) Take 1 tablet by mouth two times a day  #20 x 0   Entered and Authorized by:   Syliva Overman MD   Signed by:   Syliva Overman MD on 07/27/2009  Method used:   Electronically to        [facility]  [address] (retail)       [address]       [place], [place]  [address]       Ph: [phone]       Fax: [phone]   RxID:   [id number] RANITIDINE HCL 300 MG TABS (RANITIDINE HCL) Take 1 tablet by mouth once a day  #30 x 3   Entered and Authorized by:   Syliva Overman MD   Signed by:   Syliva Overman MD on 07/27/2009   Method used:   Electronically to        [facility]  [address] (retail)       [address]       [place], [place]  [address]       Ph: [phone]       Fax: [phone]   RxID:   [id number]    Medication Administration  Injection # 1:    Medication: Depo- Medrol 80mg     Diagnosis: ALLERGIC RHINITIS CAUSE UNSPECIFIED (ICD-477.9)    Route: IM    Site: LUOQ gluteus    Exp Date: 8/11    Lot #: [id number]    Mfr: Pharm[employer]    Patient tolerated injection without complications    Given by: Worthy Keeler LPN (July 27, 2009 4:16 PM)  Orders Added: 1)  Est. Patient Level IV [06237] 2)  Depo- Medrol 80mg  [J1040] 3)  Admin of Therapeutic Inj  intramuscular or subcutaneous [62831]

## 2010-09-26 NOTE — Assessment & Plan Note (Signed)
Summary: PHYSICAL- ROOM 2   Vital Signs:  Patient profile:   40 year old female Menstrual status:  irregular, implenon Height:      61 inches Weight:      124.25 pounds BMI:     23.56 O2 Sat:      98 % on Room air Pulse rate:   87 / minute Resp:     16 per minute BP sitting:   108 / 70  (left arm)  Vitals Entered By: Adella Hare LPN (November 15, 2009 8:35 AM) CC: physical- room 2 Is Patient Diabetic? No Pain Assessment Patient in pain? no        Primary Provider:  Syliva Overman MD  CC:  physical- room 2.  History of Present Illness: Pt is here today for her annual physical/pap. She has an Implanon - happy with it no irrg bleeding +vag dischg, x 2-3 mos, no itch, no odor no new partners  hx of abn pap, had colpo.  records are thru the health dept.  did not have any treatment such as cryo after colpo but was told to have paps q 6 mos.  +SBE  Hx of Gerd .  Well controlled on Ranitidine. Also hx of seasonal allergies.  Uses allergy meds as needed.  Pt needs a PPD today for a new job. Discussed Tdap.  She is uncertain if ins pays for immunizations.    Current Medications (verified): 1)  Implanon 68 Mg Impl (Etonogestrel) .... Inserted 07/07/2007 Removal 07/06/2010 Left Arm 2)  Ibuprofen 800 Mg Tabs (Ibuprofen) .... Take 1 Tablet By Mouth Three Times A Day 3)  Ranitidine Hcl 300 Mg Tabs (Ranitidine Hcl) .... Take 1 Tablet By Mouth Once A Day 4)  Septra Ds 800-160 Mg Tabs (Sulfamethoxazole-Trimethoprim) .... Take 1 Tablet By Mouth Two Times A Day 5)  Claritin 10 Mg Tabs (Loratadine) .... Take 1 Tablet By Mouth Once A Day  Allergies (verified): No Known Drug Allergies  Past History:  Past medical, surgical, family and social histories (including risk factors) reviewed for relevance to current acute and chronic problems.  Past Medical History: Reviewed history from 06/06/2009 and no changes required. Hyperlipidemia x 4 years HTN x when she was young but she  changed diet and reduced salt intake abnormal pap  Past Surgical History: Reviewed history from 06/06/2009 and no changes required. cervical biopsy in 2010 for abn pap Cysts removed from both breasts, all benign both breasts Breast reduction Jan 2007  Family History: Reviewed history from 06/06/2009 and no changes required. Mother-Living-thyroid problems, HTN, fibromyalgia Father-Living-alcoholic, colon problems 4 brothers-healthy 2 sisters-healthy  Social History: Reviewed history from 06/06/2009 and no changes required. 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 CV:  Denies chest pain or discomfort and palpitations. Resp:  Denies cough and shortness of breath. GI:  Denies abdominal pain, change in bowel habits, indigestion, nausea, and vomiting. GU:  Complains of discharge; denies abnormal vaginal bleeding, dysuria, incontinence, and urinary frequency. MS:  Denies joint pain, low back pain, and mid back pain. Neuro:  Denies headaches, numbness, and tingling. Psych:  Denies anxiety and depression. Allergy:  Complains of seasonal allergies.  Physical Exam  General:  Well-developed,well-nourished,in no acute distress; alert,appropriate and cooperative throughout examination Head:  Normocephalic and atraumatic without obvious abnormalities. No apparent alopecia or balding. Eyes:  pupils equal, pupils round, pupils reactive to light, and no injection.   Ears:  External ear exam shows no significant lesions or deformities.  Otoscopic examination reveals clear canals, tympanic membranes are intact bilaterally without bulging, retraction, inflammation or  discharge. Hearing is grossly normal bilaterally. Nose:  External nasal examination shows no deformity or inflammation. Nasal mucosa are pink and moist without lesions or exudates. Mouth:  Oral mucosa and oropharynx without lesions or exudates.  Teeth in good repair. Neck:  No deformities, masses, or tenderness noted. Chest Wall:  no deformities and no mass.   Breasts:  Scars bilat breasts from prev reduction surgery.  Pt has 2 firm mobile masses in Rt breast one at approx 7:00 and the other at approx 11:00.  Pt states she has had these evaluated, they are unchanged & were thought to be scar tissue from prev surg. Lungs:  Normal respiratory effort, chest expands symmetrically. Lungs are clear to auscultation, no crackles or wheezes. Heart:  Normal rate and regular rhythm. S1 and S2 normal without gallop, murmur, click, rub or other extra sounds. Abdomen:  Bowel sounds positive,abdomen soft and non-tender without masses, organomegaly or hernias noted. Genitalia:  Normal introitus for age, no external lesions, no vaginal discharge, mucosa pink and moist, no vaginal or cervical lesions, no vaginal atrophy, no friaility or hemorrhage, normal uterus size and position, no adnexal masses or tenderness Neurologic:  alert & oriented X3, sensation intact to light touch, gait normal, and DTRs symmetrical and normal.   Skin:  Intact without suspicious lesions or rashes Cervical Nodes:  No lymphadenopathy noted Axillary Nodes:  No palpable lymphadenopathy Psych:  Cognition and judgment appear intact. Alert and cooperative with normal attention span and concentration. No apparent delusions, illusions, hallucinations   Impression & Recommendations:  Problem # 1:  Preventive Health Care (ICD-V70.0) Pap obtained today. Pt needs to get prev records from health dept.  Need to determine what further follow up or evaluation of abn pap needs to be done. PPD given today.  Problem # 2:  GERD  (ICD-530.81) Assessment: Improved Continue current med.  Her updated medication list for this problem includes:    Ranitidine Hcl 300 Mg Tabs (Ranitidine hcl) .Marland Kitchen... Take 1 tablet by mouth once a day  Problem # 3:  ALLERGIC RHINITIS CAUSE UNSPECIFIED (ICD-477.9) Assessment: Improved  Her updated medication list for this problem includes:    Claritin 10 Mg Tabs (Loratadine) .Marland Kitchen... Take 1 tablet by mouth once a day  Complete Medication List: 1)  Implanon 68 Mg Impl (Etonogestrel) .... Inserted 07/07/2007 removal 07/06/2010 left arm 2)  Ibuprofen 800 Mg Tabs (Ibuprofen) .... Take 1 tablet by mouth three times a day 3)  Ranitidine Hcl 300 Mg Tabs (Ranitidine hcl) .... Take 1 tablet by mouth once a day 4)  Claritin 10 Mg Tabs (Loratadine) .... Take 1 tablet by mouth once a day  Other Orders: T-Wet Prep by [facility] [phone]) TB Skin Test [phone]) Admin 1st Vaccine (09983) Pap Smear (38250)  Patient Instructions: 1)  Please obtain previous pap smear & mammogram results.  We will need to determine if you will need an appt for additional pap in 6 months or if you will be due in 1 year. 2)  I recommend that you have lab work done to check cholesterol, blood sugar, etc. 3)  Consider Tdap  immunization. 4)  Continue current medications. 5)  You will need to return in 48-72 hrs to have your TB skin test read.   Immunizations Administered:  PPD Skin Test:    Vaccine Type: PPD    Site: right forearm    Mfr: Sanofi Pasteur    Dose: 0.1 ml    Route: ID    Given by: Adella Hare LPN    Exp. Date: 5/13    Lot #: [id number]

## 2010-09-26 NOTE — Progress Notes (Signed)
Summary: PAP  PAP   Imported By: Lind Guest 06/30/2009 13:04:32  _____________________________________________________________________  External Attachment:    Type:   Image     Comment:   External Document

## 2010-09-26 NOTE — Assessment & Plan Note (Signed)
Summary: f up from hos   Vital Signs:  Patient profile:   40 year old female Menstrual status:  irregular, implenon Height:      61 inches Weight:      124 pounds BMI:     23.51 O2 Sat:      97 % Pulse rate:   85 / minute Resp:     16 per minute BP sitting:   110 / 70  (left arm) Cuff size:   regular  Vitals Entered By: Esperanza Sheets PA (April 06, 2010 11:05 AM) CC: Was at the ER last week for a boil in her groin area. Follow up Comments is currently taking Doxycycline 100 two times a day    Primary Provider:  Syliva Overman MD  CC:  Was at the ER last week for a boil in her groin area. Follow up.  History of Present Illness: Pt was seen at ER for an absess Rt upper inner thigh.  Was getting larger and was painful with rubbing on her clothes.  I&D was not done.  Abx was prescribed and pt states she is tolerating well.  Area of infection is much smaller and nontender now.  Feels that she is overall much better.  No other complaints or concerns.  Current Medications (verified): 1)  Implanon 68 Mg Impl (Etonogestrel) .... Inserted 07/07/2007 Removal 07/06/2010 Left Arm 2)  Ibuprofen 800 Mg Tabs (Ibuprofen) .... Take 1 Tablet By Mouth Three Times A Day 3)  Ranitidine Hcl 300 Mg Tabs (Ranitidine Hcl) .... Take 1 Tablet By Mouth Once A Day 4)  Claritin 10 Mg Tabs (Loratadine) .... Take 1 Tablet By Mouth Once A Day 5)  Doxycycline Hyclate 100 Mg Caps (Doxycycline Hyclate) .... Take 1 Tablet By Mouth Two Times A Day  Allergies (verified): No Known Drug Allergies  Review of Systems General:  Denies chills and fever.  Physical Exam  General:  Well-developed,well-nourished,in no acute distress; alert,appropriate and cooperative throughout examination Head:  Normocephalic and atraumatic without obvious abnormalities. No apparent alopecia or balding. Skin:  Rt medial superior thigh at groin area palpable area of induration < 1 cm without erythema and nontender. Inguinal Nodes:   no R inguinal adenopathy.   Psych:  Cognition and judgment appear intact. Alert and cooperative with normal attention span and concentration. No apparent delusions, illusions, hallucinations   Impression & Recommendations:  Problem # 1:  ABSCESS, GROIN (ICD-682.2) Assessment Improved  The following medications were removed from the medication list:    Penicillin V Potassium 500 Mg Tabs (Penicillin v potassium) .Marland Kitchen... Take 1 tablet by mouth three times a day Her updated medication list for this problem includes:    Doxycycline Hyclate 100 Mg Caps (Doxycycline hyclate) .Marland Kitchen... Take 1 tablet by mouth two times a day  Complete Medication List: 1)  Implanon 68 Mg Impl (Etonogestrel) .... Inserted 07/07/2007 removal 07/06/2010 left arm 2)  Ibuprofen 800 Mg Tabs (Ibuprofen) .... Take 1 tablet by mouth three times a day 3)  Ranitidine Hcl 300 Mg Tabs (Ranitidine hcl) .... Take 1 tablet by mouth once a day 4)  Claritin 10 Mg Tabs (Loratadine) .... Take 1 tablet by mouth once a day 5)  Doxycycline Hyclate 100 Mg Caps (Doxycycline hyclate) .... Take 1 tablet by mouth two times a day  Patient Instructions: 1)  Keep your next appt. 2)  Recheck again if worsens. 3)  Complete antibiotic as prescribed.

## 2010-09-26 NOTE — Progress Notes (Signed)
Summary: MEDICINE  Phone Note Call from Patient   Summary of Call: NEEDS HER ACID REFLUX MEDICINE CALLED INTO [facility] [place] Initial call taken by: Lind Guest,  March 21, 2010 8:36 AM    Prescriptions: RANITIDINE HCL 300 MG TABS (RANITIDINE HCL) Take 1 tablet by mouth once a day  #30 x 1   Entered by:   Adella Hare LPN   Authorized by:   Syliva Overman MD   Signed by:   Adella Hare LPN on 16/05/9603   Method used:   Electronically to        [facility]  [address] (retail)       [address]       [place], [place]  [address]       Ph: [phone]       Fax: [phone]   RxID:   [id number]

## 2010-09-26 NOTE — Assessment & Plan Note (Signed)
Summary: new pt   Vital Signs:  Patient profile:   40 year old female Menstrual status:  irregular, implenon Height:      61 inches Weight:      132.06 pounds BMI:     25.04 O2 Sat:      98 % Pulse rate:   82 / minute Pulse rhythm:   regular Resp:     16 per minute BP sitting:   110 / 82  (right arm)  Vitals Entered By: Everitt Amber (June 06, 2009 8:54 AM)  Nutrition Counseling: Patient's BMI is greater than 25 and therefore counseled on weight management options. CC: New patient     Menstrual Status irregular, implenon   CC:  New patient.  History of Present Illness: Pt is  new to the pracxtice. She had been receiving her health care t the health dept up until recently. She denies ny recednt fever or chills. She is concerned about increased anxiety primarily as it rel;ates to financial stress, and an inability to pay her bills because of a recent layoff. She c/o low back pain esp since moving a matress several  months ago.i  Preventive Screening-Counseling & Management  Alcohol-Tobacco     Smoking Status: current     Smoking Cessation Counseling: yes  Caffeine-Diet-Exercise     Does Patient Exercise: yes      Drug Use:  no.    Current Medications (verified): 1)  Implanon 68 Mg Impl (Etonogestrel) .... 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, 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 Eyes:  Denies blurring and discharge. ENT:  Complains of nasal congestion, postnasal drainage, and sinus pressure; denies earache, hoarseness, and sore throat; perennial, using saline nasal drops, rwecommend benadryl use at night regullarly. CV:  Denies chest pain or discomfort, fainting, palpitations, and swelling of feet. Resp:  Denies cough, shortness of breath, sputum productive, and wheezing. GI:  Complains of bloody stools and constipation; denies abdominal pain, diarrhea, nausea, and vomiting; chronic constipation, bloody stools with straining over the past 7 months, daily BM when she smokes , has not smoked for the past 3 days since she stopped snoking 3 days ago. GU:  Denies abnormal vaginal bleeding, discharge, dysuria, urinary frequency, and urinary hesitancy. MS:  Complains of joint pain, loss of strength, low back pain, and stiffness; denies mid back pain; back pain level 3 after sitting for  a long period after moving a matress earlier this year,, already dx with carpal tunnel synd. Derm:  Complains of lesion(s); denies itching and rash; swelling over right ant thifg, mildly tender present for months. Neuro:  Complains of headaches; denies seizures and sensation of room spinning. Psych:  Complains of anxiety; denies depression, easily angered, easily tearful, irritability, mental problems, panic attacks, sense of great danger, suicidal  thoughts/plans, thoughts of violence, and unusual visions or sounds. Endo:  Denies excessive hunger, excessive thirst, and excessive urination. Heme:  Denies abnormal bruising and bleeding. Allergy:  Complains of sneezing; denies hives or rash, itching eyes, and persistent infections; perennial allergies.  Physical Exam  General:  alert, well-nourished, and well-hydrated.  HEENT: No facial asymmetry,  EOMI, No sinus tenderness, TM's Clear, oropharynx  pink and moist.   Chest: Clear to auscultation bilaterally.  CVS: S1, S2, No murmurs, No S3.   Abd: Soft, Nontender.  MS: Adequate ROM spine, hips, shoulders and knees. Tender over lower spine Ext: No edema.   CNS: CN 2-12 intact, power tone and sensation normal throughout.   Skin: Intact, no visible lesions or rashes.  Psych: Good eye contact, normal affect.  Memory intact, mildly anxious not depressed appearing.     Impression & Recommendations:  Problem # 1:  CONSTIPATION (ICD-564.00) Assessment Comment Only  Problem # 2:  INSOMNIA (ICD-780.52) Assessment: Deteriorated  Discussed sleep hygiene. pt to use otc benadryl for sleep and allergies, i believe that her symptoms relate to anxiety to a grt extent  Problem # 3:  BACK PAIN (ICD-724.5) Assessment: Comment Only  Her updated medication list for this problem includes:    Ibuprofen 800 Mg Tabs (Ibuprofen) .Marland Kitchen... Take 1 tablet by mouth three times a day    Cyclobenzaprine Hcl 10 Mg Tabs (Cyclobenzaprine hcl) .Marland Kitchen... Take 1 tab by mouth at bedtime as needed  Orders: Diagnostic X-Ray/Fluoroscopy (Diagnostic X-Ray/Flu)  Problem # 4:  NICOTINE ADDICTION (ICD-305.1) Assessment: Comment Only  Encouraged smoking cessation and discussed different methods for smoking cessation.   Problem # 5:  THIGH PAIN (ICD-729.5) Assessment: Comment Only x ray of right femur, clinical impression is that this sis a small lipomas  Complete Medication List: 1)  Implanon 68 Mg Impl (Etonogestrel)  .... Inserted 07/07/2007 removal 07/06/2010 left arm 2)  Ibuprofen 800 Mg Tabs (Ibuprofen) .... Take 1 tablet by mouth three times a day 3)  Cyclobenzaprine Hcl 10 Mg Tabs (Cyclobenzaprine hcl) .... Take 1 tab by mouth at bedtime as needed  Other Orders: Influenza Vaccine NON MCR (29562)  Patient Instructions: 1)  Please schedule a follow-up appointment in 3.5 months. 2)  It is important that you exercise regularly at least 30 minutes 6 times a week. If you develop chest pain, have severe difficulty breathing, or feel very tired , stop exercising immediately and seek medical attention.This will help your stress level, improve your sleep, and also help to protect your heart and help with weight loss 3)  You need to lose weight. Consider a lower calorie diet and regular exercise. You need to lose 4 pounds. 4)  For constipation can be addressed with inc fruit and veg and fiber. Drink at least 6  glasses of water daily, and regular exercise. 5)  Pls try and find out if your Dad has colon cancer. 6)  Tobacco is very bad for your health and your loved ones! You Should stop smoking!. 7)  Stop Smoking Tips: Choose a Quit date. Cut down before the Quit date. decide  what you will do as a substitute when you feel the urge to smoke(gum,toothpick,exercise). 8)  Use benadryl at night one for sleep and allergies. 9)  Use OTC stool softeners as needed , and [place] e a laxative eg MOM every 3 to 4 days if no bowel movement. 10)  I believe that you may hav a fatty tumor on your right thigh, I'll send you for an x ray of your thfhg. 11)  for back pain you will get back exercises and a course of anti-inflammatories and muscle relaxants Prescriptions: CYCLOBENZAPRINE HCL 10 MG TABS (CYCLOBENZAPRINE HCL) Take 1 tab by mouth at bedtime as needed  #10 x 0   Entered and Authorized by:   Syliva Overman MD   Signed by:   Syliva Overman MD on 06/06/2009   Method used:   Electronically to        [facility]  [address]  (retail)       [address]       [place], [place]  [address]       Ph: [phone]       Fax: [phone]   RxID:   [id number] IBUPROFEN 800 MG TABS (IBUPROFEN) Take 1 tablet by mouth three times a day  #30 x 0   Entered and Authorized by:   Syliva Overman MD   Signed by:   Syliva Overman MD on 06/06/2009   Method used:   Electronically to        [facility]  [address] (retail)       [address]       [place], [place]  [address]       Ph: [phone]       Fax: [phone]   RxID:   [id number]    Influenza Vaccine    Vaccine Type: Fluvax Non-MCR    Site: left deltoid    Mfr: novartis    Dose: 0.5 ml    Route: IM    Given by: Worthy Keeler LPN    Exp. Date: 04/11    Lot #: [id number]    VIS given: 03/20/07 version given June 06, 2009.

## 2010-09-26 NOTE — Progress Notes (Signed)
Summary: HEADACHE  Phone Note Call from Patient   Summary of Call: SINUS HEADACHES SHE THINKS WANTS AN APPOINMENT NONE [facility]. Initial call taken by: Lind Guest,  July 05, 2010 4:21 PM  Follow-up for Phone Call        pls give appt next wek and advise urgent care if she is unable too wait Follow-up by: Syliva Overman MD,  July 05, 2010 4:58 PM  Additional Follow-up for Phone Call Additional follow up Details #1::        APPT NEXT WEEK Additional Follow-up by: Lind Guest,  July 06, 2010 3:35 PM

## 2010-09-26 NOTE — Progress Notes (Signed)
Summary: RX  Phone Note Call from Patient   Summary of Call: NEEDS HER RANITIDINE 300 MG [facility] [place]  Initial call taken by: Lind Guest,  June 16, 2010 9:54 AM    Prescriptions: RANITIDINE HCL 300 MG TABS (RANITIDINE HCL) Take 1 tablet by mouth once a day  #30 x 2   Entered by:   Adella Hare LPN   Authorized by:   Syliva Overman MD   Signed by:   Adella Hare LPN on 16/05/9603   Method used:   Electronically to        [facility]  [address] (retail)       [address]       [place], [place]  [address]       Ph: [phone]       Fax: [phone]   RxID:   [id number]

## 2010-09-27 ENCOUNTER — Telehealth: Payer: Self-pay | Admitting: Family Medicine

## 2010-09-28 NOTE — Assessment & Plan Note (Signed)
Summary: OV   Vital Signs:  Patient profile:   40 year old female Menstrual status:  irregular, implenon Height:      61 inches Weight:      129 pounds BMI:     24.46 O2 Sat:      97 % on Room air Temp:     98.7 degrees F oral Pulse rate:   97 / minute Resp:     16 per minute BP sitting:   110 / 60  (right arm)  Vitals Entered By: Adella Hare LPN (July 31, 2010 2:17 PM)  O2 Flow:  Room air CC: head congestion Is Patient Diabetic? No Pain Assessment Patient in pain? no        Primary Care Provider:  Syliva Overman MD  CC:  head congestion.  History of Present Illness: 2 day h/o head and nasal congestion, generalised body aches, ear pressure, no sore throat or cough Reports  thatprior to this she had been doing well. Denies recent fever or chills.  Denies chest congestion, or cough productive of sputum. Denies chest pain, palpitations, PND, orthopnea or leg swelling. Denies abdominal pain, nausea, vomitting, diarrhea or constipation. Denies change in bowel movements or bloody stool. Denies dysuria , frequency, incontinence or hesitancy. Denies  joint pain, swelling, or reduced mobility. Denies headaches, vertigo, seizures. Denies depression, anxiety or insomnia. Denies  rash, lesions, or itch.      Current Medications (verified): 1)  Implanon 68 Mg Impl (Etonogestrel) .... Inserted 07/07/2007 Removal 07/06/2010 Left Arm 2)  Ranitidine Hcl 300 Mg Tabs (Ranitidine Hcl) .... Take 1 Tablet By Mouth Once A Day  Allergies (verified): No Known Drug Allergies  Review of Systems      See HPI General:  Complains of fatigue and loss of appetite. Eyes:  Denies discharge and red eye. ENT:  Complains of nasal congestion and postnasal drainage. Heme:  Denies abnormal bruising, bleeding, enlarge lymph nodes, and pallor. Allergy:  Complains of seasonal allergies.  Physical Exam  General:  Well-developed,well-nourished,in no acute distress; alert,appropriate and  cooperative throughout examination HEENT: No facial asymmetry,  EOMI,maxillary sinus tenderness, TM's Clear, oropharynx  pink and moist.erythema and swelling of nasal mucosa.  Chest: Clear to auscultation bilaterally.  CVS: S1, S2, No murmurs, No S3.   Abd: Soft, Nontender.  MS: Adequate ROM spine, hips, shoulders and knees.  Ext: No edema.   CNS: CN 2-12 intact, power tone and sensation normal throughout.   Skin: Intact, no visible lesions or rashes.  Psych: Good eye contact, normal affect.  Memory intact, not anxious or depressed appearing.    Impression & Recommendations:  Problem # 1:  ACUTE MAXILLARY SINUSITIS (ICD-461.0) Assessment Comment Only  The following medications were removed from the medication list:    Doxycycline Hyclate 100 Mg Caps (Doxycycline hyclate) .Marland Kitchen... Take 1 tablet by mouth two times a day Her updated medication list for this problem includes:    Septra Ds 800-160 Mg Tabs (Sulfamethoxazole-trimethoprim) .Marland Kitchen... Take 1 tablet by mouth two times a day    Fluticasone Propionate 50 Mcg/act Susp (Fluticasone propionate) ..... One to two puffs per nostril once daily  Orders: Depo- Medrol 80mg  (J1040) Admin of Therapeutic Inj  intramuscular or subcutaneous ([address])  Problem # 2:  ALLERGIC RHINITIS CAUSE UNSPECIFIED (ICD-477.9) Assessment: Deteriorated  The following medications were removed from the medication list:    Claritin 10 Mg Tabs (Loratadine) .Marland Kitchen... Take 1 tablet by mouth once a day Her updated medication list for this problem includes:  Fluticasone Propionate 50 Mcg/act Susp (Fluticasone propionate) ..... One to two puffs per nostril once daily  Complete Medication List: 1)  Implanon 68 Mg Impl (Etonogestrel) .... Inserted 07/07/2007 removal 07/06/2010 left arm 2)  Ranitidine Hcl 300 Mg Tabs (Ranitidine hcl) .... Take 1 tablet by mouth once a day 3)  Septra Ds 800-160 Mg Tabs (Sulfamethoxazole-trimethoprim) .... Take 1 tablet by mouth two times a  day 4)  Fluconazole 150 Mg Tabs (Fluconazole) .... Take 1 tablet by mouth once a day as needed for vaginal itch 5)  Fluticasone Propionate 50 Mcg/act Susp (Fluticasone propionate) .... One to two puffs per nostril once daily  Patient Instructions: 1)  You are being treated for uncontrolled allergiers and early sinusitis. 2)  You will get an injection of depromedrol. 3)  Meds are sent in for uncontrolled allergies to your pharmacy and infection 4)  pLs use salt water washes to your nostrils every 3 to 4 hours while awake Prescriptions: FLUTICASONE PROPIONATE 50 MCG/ACT SUSP (FLUTICASONE PROPIONATE) one to two puffs per nostril once daily  #1 x 3   Entered and Authorized by:   Syliva Overman MD   Signed by:   Syliva Overman MD on 07/31/2010   Method used:   Electronically to        [facility] [address] [facility] (retail)       [address]       [place], [place]  [address]       Ph: [phone]       Fax: [phone]   RxID:   [id number] FLUCONAZOLE 150 MG TABS (FLUCONAZOLE) Take 1 tablet by mouth once a day as needed for vaginal itch  #2 x 0   Entered and Authorized by:   Syliva Overman MD   Signed by:   Syliva Overman MD on 07/31/2010   Method used:   Electronically to        [facility]  [address] (retail)       [address]       [place], [place]  [address]       Ph: [phone]       Fax: [phone]   RxID:   [id number] SEPTRA DS 800-160 MG TABS (SULFAMETHOXAZOLE-TRIMETHOPRIM) Take 1 tablet by mouth two times a day  #14 x 0   Entered and Authorized by:   Syliva Overman MD   Signed by:   Syliva Overman MD on 07/31/2010   Method used:   Electronically to        [facility]  [address] (retail)       [address]       [place], [place]  [address]       Ph: [phone]       Fax: [phone]   RxID:   [id number] PREDNISONE (PAK) 5 MG TABS (PREDNISONE) Use as directed  #21 x 0   Entered and Authorized by:    Syliva Overman MD   Signed by:   Syliva Overman MD on 07/31/2010   Method used:   Electronically to        [facility] [address] [facility] (retail)       [address]       [place], [place]  [address]       Ph: [phone]  Fax: [phone]   RxID:   [id number]    Medication Administration  Injection # 1:    Medication: Depo- Medrol 80mg     Diagnosis: ACUTE MAXILLARY SINUSITIS (ICD-461.0)    Route: IM    Site: RUOQ gluteus    Exp Date: 06/12    Lot #: [id number]    Mfr: Pharmacia    Patient tolerated injection without complications    Given by: Adella Hare LPN (July 31, 2010 4:36 PM)  Orders Added: 1)  Est. Patient Level III [84[address]] 2)  Depo- Medrol 80mg  [J1040] 3)  Admin of Therapeutic Inj  intramuscular or subcutaneous [[address]]     Medication Administration  Injection # 1:    Medication: Depo- Medrol 80mg     Diagnosis: ACUTE MAXILLARY SINUSITIS (ICD-461.0)    Route: IM    Site: RUOQ gluteus    Exp Date: 06/12    Lot #: [id number]    Mfr: Pharmacia    Patient tolerated injection without complications    Given by: Adella Hare LPN (July 31, 2010 4:36 PM)  Orders Added: 1)  Est. Patient Level III [295[address]] 2)  Depo- Medrol 80mg  [J1040] 3)  Admin of Therapeutic Inj  intramuscular or subcutaneous [[address]]

## 2010-09-28 NOTE — Progress Notes (Signed)
  Phone Note Other Incoming   Caller: patient Summary of Call: c/o  fishy odor x 2 days, has douched, has had bV in the past, requesting med, advised against douching and sent in script, she is aware Initial call taken by: Syliva Overman MD,  August 14, 2010 12:08 PM    New/Updated Medications: METRONIDAZOLE 500 MG TABS (METRONIDAZOLE) Take 1 tablet by mouth two times a day FLUCONAZOLE 150 MG TABS (FLUCONAZOLE) Take 1 tablet by mouth once a day as needed Prescriptions: FLUCONAZOLE 150 MG TABS (FLUCONAZOLE) Take 1 tablet by mouth once a day as needed  #3 x 0   Entered and Authorized by:   Syliva Overman MD   Signed by:   Syliva Overman MD on 08/14/2010   Method used:   Electronically to        [facility]  [address] (retail)       [address]       [place], [place]  [address]       Ph: [phone]       Fax: [phone]   RxID:   [id number] METRONIDAZOLE 500 MG TABS (METRONIDAZOLE) Take 1 tablet by mouth two times a day  #14 x 0   Entered and Authorized by:   Syliva Overman MD   Signed by:   Syliva Overman MD on 08/14/2010   Method used:   Electronically to        [facility]  [address] (retail)       [address]       [place], [place]  [address]       Ph: [phone]       Fax: [phone]   RxID:   [id number]

## 2010-09-28 NOTE — Assessment & Plan Note (Signed)
Summary: office visit   Vital Signs:  Patient profile:   40 year old female Menstrual status:  irregular, implenon Height:      61 inches Weight:      123.25 pounds BMI:     23.37 O2 Sat:      94 % Pulse rate:   89 / minute Pulse rhythm:   regular Resp:     16 per minute BP sitting:   108 / 78  (left arm) Cuff size:   regular  Vitals Entered By: Everitt Amber LPN (September 07, 2010 2:12 PM) CC: c/o nasal passages burning and sinus headache, yesterday had a headache in the back of her head   Primary Care Provider:  Syliva Overman MD  CC:  c/o nasal passages burning and sinus headache and yesterday had a headache in the back of her head.  History of Present Illness: c/o nasal stuffiness , sinus pressure and headache. she also c/o stress and anxiety as it relates to unemployment and poor finances. She c/o exesssive stomach growling and wonders if there is a problem, she denies any vomitting or change in bowel movemnents   Current Medications (verified): 1)  Implanon 68 Mg Impl (Etonogestrel) .... Inserted 07/07/2007 Removal 07/06/2010 Left Arm 2)  Ranitidine Hcl 300 Mg Tabs (Ranitidine Hcl) .... Take 1 Tablet By Mouth Once A Day  Allergies (verified): No Known Drug Allergies  Review of Systems      See HPI General:  Complains of fatigue and sleep disorder. Eyes:  Denies discharge, eye pain, and red eye. ENT:  Complains of hoarseness and nasal congestion. CV:  Denies chest pain or discomfort, palpitations, and shortness of breath with exertion. Resp:  Denies cough and sputum productive. GI:  Complains of abdominal pain; denies constipation, diarrhea, nausea, and vomiting. GU:  Denies dysuria and urinary frequency. MS:  Denies joint pain and stiffness. Heme:  Denies abnormal bruising and bleeding. Allergy:  Complains of seasonal allergies; denies hives or rash and itching eyes.  Physical Exam  General:  Well-developed,well-nourished,in no acute distress;  alert,appropriate and cooperative throughout examination HEENT: No facial asymmetry,  EOMI, No sinus tenderness, TM's Clear, oropharynx  pink and moist.   Chest: Clear to auscultation bilaterally.  CVS: S1, S2, No murmurs, No S3.   Abd: Soft, Nontender.  MS: Adequate ROM spine, hips, shoulders and knees.  Ext: No edema.   CNS: CN 2-12 intact, power tone and sensation normal throughout.   Skin: Intact, no visible lesions or rashes.  Psych: Good eye contact, normal affect.  Memory intact, not anxious or depressed appearing.\par    Impression & Recommendations:  Problem # 1:  ALLERGIC RHINITIS (ICD-477.9)  Discussed use of allergy medications and environmental measures.   Problem # 2:  INSOMNIA (ICD-780.52) Assessment: Deteriorated  Her updated medication list for this problem includes:    Temazepam 15 Mg Caps (Temazepam) .Marland Kitchen... Take 1 capsule by mouth at bedtime  Discussed sleep hygiene.   Problem # 3:  HEADACHE (ICD-784.0) Assessment: Deteriorated  Orders: Depo- Medrol 80mg  (J1040) Ketorolac-Toradol 15mg  (J1914) Admin of Therapeutic Inj  intramuscular or subcutaneous (78295)  Complete Medication List: 1)  Implanon 68 Mg Impl (Etonogestrel) .... Inserted 07/07/2007 removal 07/06/2010 left arm 2)  Ranitidine Hcl 300 Mg Tabs (Ranitidine hcl) .... Take 1 tablet by mouth once a day 3)  Temazepam 15 Mg Caps (Temazepam) .... Take 1 capsule by mouth at bedtime  Patient Instructions: 1)  F/U in 6 wee.ks. 2)  Injections today for sinusitis and  headache. 3)  New med for sleep Prescriptions: TEMAZEPAM 15 MG CAPS (TEMAZEPAM) Take 1 capsule by mouth at bedtime  #30 x 2   Entered and Authorized by:   Syliva Overman MD   Signed by:   Syliva Overman MD on 09/07/2010   Method used:   Printed then faxed to ...       [facility] [address] [facility] (retail)       [address]       [place], [place]  [address]       Ph: [phone]       Fax: [phone]   RxID:    [id number] RANITIDINE HCL 300 MG TABS (RANITIDINE HCL) Take 1 tablet by mouth once a day  #30 x 3   Entered by:   Everitt Amber LPN   Authorized by:   Syliva Overman MD   Signed by:   Everitt Amber LPN on 25/36/6440   Method used:   Electronically to        [facility]  [address] (retail)       [address]       [place], [place]  [address]       Ph: [phone]       Fax: [phone]   RxID:   [id number]    Medication Administration  Injection # 1:    Medication: Depo- Medrol 80mg     Diagnosis: HEADACHE (ICD-784.0)    Route: IM    Site: RUOQ gluteus    Exp Date: 02/2011    Lot #: Gunnar [id number]    Mfr: Pharmacia    Comments: 80mg  given     Patient tolerated injection without complications    Given by: Everitt Amber LPN (September 07, 2010 3:11 PM)  Injection # 2:    Medication: Ketorolac-Toradol 15mg     Diagnosis: HEADACHE (ICD-784.0)    Route: IM    Site: LUOQ gluteus    Exp Date: 01/2012    Lot #: [id number]     Mfr: [email or web address]    Comments: 60mg  given     Patient tolerated injection without complications    Given by: Everitt Amber LPN (September 07, 2010 3:12 PM)  Orders Added: 1)  Est. Patient Level III [57322] 2)  Depo- Medrol 80mg  [J1040] 3)  Ketorolac-Toradol 15mg  [J1885] 4)  Admin of Therapeutic Inj  intramuscular or subcutaneous [96372]     Medication Administration  Injection # 1:    Medication: Depo- Medrol 80mg     Diagnosis: HEADACHE (ICD-784.0)    Route: IM    Site: RUOQ gluteus    Exp Date: 02/2011    Lot #: Gunnar Bulla    Mfr: Pharmacia    Comments: 80mg  given     Patient tolerated injection without complications    Given by: Everitt Amber LPN (September 07, 2010 3:11 PM)  Injection # 2:    Medication: Ketorolac-Toradol 15mg     Diagnosis: HEADACHE (ICD-784.0)    Route: IM    Site: LUOQ gluteus    Exp Date: 01/2012    Lot #: [id number]     Mfr: [email or web address]    Comments: 60mg  given     Patient tolerated injection without complications    Given  by: Everitt Amber LPN (September 07, 2010 3:12 PM)  Orders Added: 1)  Est. Patient Level III [02542] 2)  Depo- Medrol 80mg  [J1040] 3)  Ketorolac-Toradol 15mg  [J1885] 4)  Admin of  Therapeutic Inj  intramuscular or subcutaneous [16109]

## 2010-09-29 ENCOUNTER — Emergency Department (HOSPITAL_COMMUNITY): Admit: 2010-09-29 | Discharge: 2010-09-29 | Disposition: A | Discharge status: Home / Self Care | Payer: Self-pay

## 2010-09-29 ENCOUNTER — Emergency Department (HOSPITAL_COMMUNITY)
Admission: EM | Admit: 2010-09-29 | Discharge: 2010-09-29 | Disposition: A | Discharge status: Home / Self Care | Payer: Self-pay | Attending: Emergency Medicine | Admitting: Emergency Medicine

## 2010-09-29 DIAGNOSIS — R109 Unspecified abdominal pain: Secondary | ICD-10-CM | POA: Insufficient documentation

## 2010-09-29 DIAGNOSIS — N201 Calculus of ureter: Secondary | ICD-10-CM | POA: Insufficient documentation

## 2010-09-29 LAB — URINALYSIS, ROUTINE W REFLEX MICROSCOPIC
Ketones, ur: >80 mg/dL — AB
Leukocytes, UA: NEGATIVE
Nitrite: NEGATIVE
Protein, ur: 100 mg/dL — AB
Specific Gravity, Urine: 1.025 (ref 1.005–1.030)
Urine Glucose, Fasting: NEGATIVE mg/dL
Urobilinogen, UA: 0.2 mg/dL (ref 0.0–1.0)
pH: 7 (ref 5.0–8.0)

## 2010-09-29 LAB — URINE MICROSCOPIC-ADD ON

## 2010-09-29 LAB — POCT I-STAT, CHEM 8
BUN: 10 mg/dL (ref 6–23)
Calcium, Ion: 1.1 mmol/L — ABNORMAL LOW (ref 1.12–1.32)
Chloride: 104 mEq/L (ref 96–112)
Creatinine, Ser: 0.9 mg/dL (ref 0.4–1.2)
Glucose, Bld: 128 mg/dL — ABNORMAL HIGH (ref 70–99)
HCT: 47 % — ABNORMAL HIGH (ref 36.0–46.0)
Hemoglobin: 16 g/dL — ABNORMAL HIGH (ref 12.0–15.0)
Potassium: 3.4 mEq/L — ABNORMAL LOW (ref 3.5–5.1)
Sodium: 138 mEq/L (ref 135–145)
TCO2: 24 mmol/L (ref 0–100)

## 2010-09-29 LAB — POCT PREGNANCY, URINE: Preg Test, Ur: NEGATIVE

## 2010-10-01 ENCOUNTER — Emergency Department (HOSPITAL_COMMUNITY)
Admission: EM | Admit: 2010-10-01 | Discharge: 2010-10-01 | Disposition: A | Discharge status: Home / Self Care | Payer: Self-pay | Attending: Emergency Medicine | Admitting: Emergency Medicine

## 2010-10-01 DIAGNOSIS — K219 Gastro-esophageal reflux disease without esophagitis: Secondary | ICD-10-CM | POA: Insufficient documentation

## 2010-10-01 DIAGNOSIS — R3911 Hesitancy of micturition: Secondary | ICD-10-CM | POA: Insufficient documentation

## 2010-10-01 DIAGNOSIS — R110 Nausea: Secondary | ICD-10-CM | POA: Insufficient documentation

## 2010-10-01 DIAGNOSIS — R339 Retention of urine, unspecified: Secondary | ICD-10-CM | POA: Insufficient documentation

## 2010-10-01 DIAGNOSIS — R3915 Urgency of urination: Secondary | ICD-10-CM | POA: Insufficient documentation

## 2010-10-01 DIAGNOSIS — N200 Calculus of kidney: Secondary | ICD-10-CM | POA: Insufficient documentation

## 2010-10-01 DIAGNOSIS — R109 Unspecified abdominal pain: Secondary | ICD-10-CM | POA: Insufficient documentation

## 2010-10-01 LAB — URINALYSIS, ROUTINE W REFLEX MICROSCOPIC
Bilirubin Urine: NEGATIVE
Ketones, ur: NEGATIVE mg/dL
Leukocytes, UA: NEGATIVE
Nitrite: NEGATIVE
Protein, ur: NEGATIVE mg/dL
Specific Gravity, Urine: 1.015 (ref 1.005–1.030)
Urine Glucose, Fasting: NEGATIVE mg/dL
Urobilinogen, UA: 0.2 mg/dL (ref 0.0–1.0)
pH: 7 (ref 5.0–8.0)

## 2010-10-01 LAB — URINE MICROSCOPIC-ADD ON

## 2010-10-03 ENCOUNTER — Telehealth: Payer: Self-pay | Admitting: Family Medicine

## 2010-10-03 ENCOUNTER — Emergency Department (HOSPITAL_COMMUNITY)
Admission: EM | Admit: 2010-10-03 | Discharge: 2010-10-04 | Disposition: A | Discharge status: Home / Self Care | Payer: Self-pay | Attending: Emergency Medicine | Admitting: Emergency Medicine

## 2010-10-03 DIAGNOSIS — R350 Frequency of micturition: Secondary | ICD-10-CM | POA: Insufficient documentation

## 2010-10-03 DIAGNOSIS — Z79899 Other long term (current) drug therapy: Secondary | ICD-10-CM | POA: Insufficient documentation

## 2010-10-03 DIAGNOSIS — K5900 Constipation, unspecified: Secondary | ICD-10-CM | POA: Insufficient documentation

## 2010-10-03 DIAGNOSIS — R109 Unspecified abdominal pain: Secondary | ICD-10-CM | POA: Insufficient documentation

## 2010-10-03 DIAGNOSIS — N200 Calculus of kidney: Secondary | ICD-10-CM | POA: Insufficient documentation

## 2010-10-03 LAB — URINALYSIS, ROUTINE W REFLEX MICROSCOPIC
Bilirubin Urine: NEGATIVE
Hgb urine dipstick: NEGATIVE
Ketones, ur: 15 mg/dL — AB
Nitrite: NEGATIVE
Protein, ur: NEGATIVE mg/dL
Specific Gravity, Urine: <1.005 — ABNORMAL LOW (ref 1.005–1.030)
Urine Glucose, Fasting: NEGATIVE mg/dL
Urobilinogen, UA: 0.2 mg/dL (ref 0.0–1.0)
pH: 7 (ref 5.0–8.0)

## 2010-10-03 LAB — PREGNANCY, URINE: Preg Test, Ur: NEGATIVE

## 2010-10-04 NOTE — Progress Notes (Signed)
  Phone Note Call from Patient   Summary of Call: Patient needs something for her sinuses. c/o sinus congestion (clear). Right ear pain and been having some dizziness also. Wants to know if she can have injection in office or meds sent to [facility] Initial call taken by: Everitt Amber LPN,  September 27, 2010 10:50 AM  Follow-up for Phone Call        [facility] Follow-up by: Syliva Overman MD,  September 27, 2010 5:00 PM  Additional Follow-up for Phone Call Additional follow up Details #1::        left detailed voicemail Additional Follow-up by: Adella Hare LPN,  September 27, 2010 5:04 PM    New/Updated Medications: PREDNISONE (PAK) 5 MG TABS (PREDNISONE) Use as directed MECLIZINE HCL 12.5 MG TABS (MECLIZINE HCL) Take 1 tablet by mouth three times a day as needed for dizziness Prescriptions: MECLIZINE HCL 12.5 MG TABS (MECLIZINE HCL) Take 1 tablet by mouth three times a day as needed for dizziness  #20 x 0   Entered and Authorized by:   Syliva Overman MD   Signed by:   Syliva Overman MD on 09/27/2010   Method used:   Electronically to        [facility]  [address] (retail)       [address]       [place], [place]  [address]       Ph: [phone]       Fax: [phone]   RxID:   [id number] PREDNISONE (PAK) 5 MG TABS (PREDNISONE) Use as directed  #21 x 0   Entered and Authorized by:   Syliva Overman MD   Signed by:   Syliva Overman MD on 09/27/2010   Method used:   Electronically to        [facility] [address] [facility] ([facility]ail)       [address]       [place], [place]  [address]       Ph: [phone]       Fax: [phone]   RxID:   [id number]

## 2010-10-12 NOTE — Progress Notes (Signed)
  Phone Note Call from Patient   Caller: Patient Summary of Call: patient states she is taking percocet for kidney stones and has not had a bowel movement in 5 days, what is the best med to take to make her bowels move? Initial call taken by: Adella Hare LPN,  October 03, 2010 4:35 PM  Follow-up for Phone Call        magnesium citrate per directions on the bottle Follow-up by: Syliva Overman MD,  October 03, 2010 4:36 PM  Additional Follow-up for Phone Call Additional follow up Details #1::        left detailed voicemail Additional Follow-up by: Adella Hare LPN,  October 03, 2010 4:38 PM    +

## 2010-10-12 NOTE — Progress Notes (Signed)
Summary: sick  Phone Note Call from Patient   Summary of Call: pt needs to get something called into [facility]. she is in alot of pain. Wants to speak with jamie [phone] Initial call taken by: Rudene Anda,  October 03, 2010 9:26 AM  Follow-up for Phone Call        patient states she doesnt need anything at this point, she has been to er twice for kidney stones and they have given her prescriptions Follow-up by: Adella Hare LPN,  October 03, 2010 10:41 AM

## 2010-10-23 ENCOUNTER — Telehealth: Payer: Self-pay | Admitting: Family Medicine

## 2010-10-30 ENCOUNTER — Ambulatory Visit: Payer: Self-pay | Admitting: Family Medicine

## 2010-10-30 ENCOUNTER — Encounter: Payer: Self-pay | Admitting: Family Medicine

## 2010-10-30 ENCOUNTER — Telehealth: Payer: Self-pay | Admitting: Family Medicine

## 2010-10-30 DIAGNOSIS — J019 Acute sinusitis, unspecified: Secondary | ICD-10-CM

## 2010-10-30 DIAGNOSIS — J309 Allergic rhinitis, unspecified: Secondary | ICD-10-CM

## 2010-11-02 NOTE — Progress Notes (Signed)
Summary: medicine  Phone Note Call from Patient   Summary of Call: needs her ranitidine sent to [facility] [place] Initial call taken by: Lind Guest,  October 23, 2010 1:54 PM    Prescriptions: RANITIDINE HCL 300 MG TABS (RANITIDINE HCL) Take 1 tablet by mouth once a day  #30 x 3   Entered by:   Everitt Amber LPN   Authorized by:   Syliva Overman MD   Signed by:   Everitt Amber LPN on 16/05/9603   Method used:   Electronically to        [facility]  [address] (retail)       [address]       [place], [place]  [address]       Ph: [phone]       Fax: [phone]   RxID:   [id number]

## 2010-11-07 NOTE — Progress Notes (Signed)
Summary: med  Phone Note Call from Patient   Summary of Call: pt would like to get some ibprofren called in. [phone] Initial call taken by: Rudene Anda,  October 30, 2010 2:03 PM  Follow-up for Phone Call        ibuprofen 800mg  for uncontrolled headaches  [facility] Follow-up by: Adella Hare LPN,  October 30, 2010 2:13 PM  Additional Follow-up for Phone Call Additional follow up Details #1::        opls send in /call in, I cannot get in ibuprofen 800mg  Take 1 tablet by mouth two times a day as needed for uncontrolled headache #30 refill zero Additional Follow-up by: Syliva Overman MD,  October 30, 2010 4:55 PM    Additional Follow-up for Phone Call Additional follow up Details #2::    patient aware Follow-up by: Adella Hare LPN,  October 30, 2010 4:58 PM  New/Updated Medications: IBUPROFEN 800 MG TABS (IBUPROFEN) one tab by mouth two times a day as needed for uncontrolled headache Prescriptions: IBUPROFEN 800 MG TABS (IBUPROFEN) one tab by mouth two times a day as needed for uncontrolled headache  #30 x 0   Entered by:   Adella Hare LPN   Authorized by:   Syliva Overman MD   Signed by:   Adella Hare LPN on 82/95/6213   Method used:   Electronically to        [facility]  [address] (retail)       [address]       [place], [place]  [address]       Ph: [phone]       Fax: [phone]   RxID:   [id number]

## 2010-11-07 NOTE — Assessment & Plan Note (Signed)
Summary: sinus problem   Vital Signs:  Patient Profile:   40 Years Old Female CC:      Sinus Headache Height:     61 inches Weight:      123 pounds BMI:     23.32 O2 Sat:      99 % O2 treatment:    Room Air Temp:     97.8 degrees F oral Pulse rate:   74 / minute Pulse rhythm:   regular Resp:     14 per minute BP sitting:   133 / 89  (left arm) Cuff size:   regular  Pt. in pain?   yes    Location:   head  Vitals Entered By: Standley Dakins MD (October 30, 2010 4:17 PM)                   Current Allergies (reviewed today): No known allergies History of Present Illness History from: patient Reason for visit: see chief complaint Chief Complaint: Sinus Headache History of Present Illness: The patient is reporting that for the last couple of days she has had progressive nasal and sinus pressure and pain and experiencing chronic postnasal drainage.  She says that she has developed maxillary sinus pain and headache from the pressure in the nose and sinuses.  She reports that she is having  no fever or chills. No n/v/d.  No chest pain.  She is without medical insurance and requesting prescriptions on the [facility] discount list program.     REVIEW OF SYSTEMS Constitutional Symptoms      Denies fever, chills, night sweats, weight loss, weight gain, and fatigue.  Eyes       Denies change in vision, eye pain, eye discharge, glasses, contact lenses, and eye surgery. Ear/Nose/Throat/Mouth       Complains of frequent runny nose and sinus problems.      Denies hearing loss/aids, change in hearing, ear pain, ear discharge, dizziness, frequent nose bleeds, sore throat, hoarseness, and tooth pain or bleeding.  Respiratory       Complains of dry cough.      Denies productive cough, wheezing, shortness of breath, asthma, bronchitis, and emphysema/COPD.  Cardiovascular       Denies murmurs, chest pain, and tires easily with exhertion.    Gastrointestinal       Denies stomach pain,  nausea/vomiting, diarrhea, constipation, blood in bowel movements, and indigestion. Genitourniary       Denies painful urination, kidney stones, and loss of urinary control. Neurological       Complains of headaches.      Denies paralysis, seizures, and fainting/blackouts. Musculoskeletal       Denies muscle pain, joint pain, joint stiffness, decreased range of motion, redness, swelling, muscle weakness, and gout.  Skin       Denies bruising, unusual mles/lumps or sores, and hair/skin or nail changes.  Psych       Denies mood changes, temper/anger issues, anxiety/stress, speech problems, depression, and sleep problems.  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 cervical biopsy in 2010 for abn pap Cysts removed from both breasts, all benign both breasts Breast reduction Jan 2007  Family History: Reviewed history from 06/06/2009 and no changes required. 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 Single Alcohol use-yes  occasionally Drug use-no Regular exercise-walking No children quit nicotine 05/2009 Physical Exam General appearance: well developed, well nourished, no acute distress Head: normocephalic, atraumatic Eyes: conjunctivae and lids normal Pupils: equal, round, reactive to light Ears: normal, no lesions or deformities Nasal: pale, boggy, swollen nasal turbinates Oral/Pharynx: bilateral tonsilar enlargement, uvula midline without deviation Neck: neck supple,  trachea midline, no masses Chest/Lungs: no rales, wheezes, or rhonchi bilateral, breath sounds equal without effort Extremities: normal extremities Neurological: grossly intact and non-focal Skin: no obvious rashes or lesions MSE: oriented to time, place, and person Assessment Problems:   ALLERGIC RHINITIS (ICD-477.9) HEADACHE (ICD-784.0) ACUTE MAXILLARY SINUSITIS (ICD-461.0) ABSCESS, GROIN (ICD-682.2) OTITIS MEDIA (ICD-382.9) CONJUNCTIVITIS, ALLERGIC, ACUTE (ICD-372.14) HYPERLIPIDEMIA (ICD-272.4) HEALTH MAINTENANCE EXAM (ICD-V70.0) OTHER ACUTE SINUSITIS (ICD-461.8) ALLERGIC RHINITIS CAUSE UNSPECIFIED (ICD-477.9) GERD (ICD-530.81) NICOTINE ADDICTION (ICD-305.1) THIGH PAIN (ICD-729.5) CONSTIPATION (ICD-564.00) INSOMNIA (ICD-780.52) BACK PAIN (ICD-724.5)  Assessed ALLERGIC RHINITIS as deteriorated - Ivis Nicolson MD Assessed HEADACHE as unchanged - Whitnee Orzel MD Assessed ALLERGIC RHINITIS CAUSE UNSPECIFIED as deteriorated - Rigby Leonhardt MD New Problems: ? of ACUTE MAXILLARY SINUSITIS (ICD-461.0)   Patient Education: Patient and/or caregiver instructed in the following: rest, fluids, Tylenol prn, Ibuprofen prn. The risks, benefits and possible side effects were clearly explained and discussed with the patient.  The patient verbalized clear understanding.  The patient was given instructions to return if symptoms don't improve, worsen or new changes develop.  If it is not during clinic hours and the patient  cannot get back to this clinic then the patient was told to seek medical care at an available urgent care or emergency department.  The patient verbalized understanding.   Demonstrates willingness to comply.  Plan New Medications/Changes: AFRIN NASAL SPRAY 0.05 % SOLN (OXYMETAZOLINE HCL) 1 spray per nostril two times a day up to 3 days max then Discontinue  #1 x 0, 10/30/2010, Kidus Delman MD DOXYCYCLINE HYCLATE 100 MG TABS (DOXYCYCLINE HYCLATE) take 1 by mouth two times a day with food until completed  #20 x 0, 10/30/2010, Carless Slatten MD LORATADINE 10 MG TABS (LORATADINE) take 1 by mouth daily for allergies  #30 x 1, 10/30/2010, Standley Dakins MD  Planning Comments:   Pt requested medications on $4 list at [facility].    Follow Up: Follow up in 2-3 days if no improvement, Follow up on an as needed basis, Follow up with Primary Physician  The patient and/or caregiver has been counseled thoroughly with regard to medications prescribed including dosage, schedule, interactions, rationale for use, and possible side effects and they verbalize understanding.  Diagnoses and expected course of recovery discussed and will return if not improved as expected or if the condition worsens. Patient and/or caregiver verbalized understanding.  Prescriptions: AFRIN NASAL SPRAY 0.05 % SOLN (OXYMETAZOLINE HCL) 1 spray per nostril two times a day up to 3 days max then Discontinue  #1 x 0   Entered and Authorized by:   Standley Dakins MD   Signed by:   Standley Dakins MD on 10/30/2010  Method used:   Electronically to        [facility]  [address] (retail)       [address]       [place], [place]  [address]       Ph: [phone]       Fax: [phone]   RxID:   [id number] DOXYCYCLINE HYCLATE 100 MG TABS (DOXYCYCLINE HYCLATE) take 1 by mouth two times a day with food until completed  #20 x 0   Entered and Authorized by:   Standley Dakins MD   Signed by:   Standley Dakins MD  on 10/30/2010   Method used:   Electronically to        [facility]  [address] (retail)       [address]       [place], [place]  [address]       Ph: [phone]       Fax: [phone]   RxID:   [id number] LORATADINE 10 MG TABS (LORATADINE) take 1 by mouth daily for allergies  #30 x 1   Entered and Authorized by:   Standley Dakins MD   Signed by:   Standley Dakins MD on 10/30/2010   Method used:   Electronically to        [facility]  [address] (retail)       [address]       [place], [place]  [address]       Ph: [phone]       Fax: [phone]   RxID:   [id number]   Patient Instructions: 1)  Go to the pharmacy and pick up your prescription (s).  It may take up to 30 mins for electronic prescriptions to be delivered to the pharmacy.  Please call if your pharmacy has not received your prescriptions after 30 minutes.   2)  Take your antibiotic as prescribed until ALL of it is gone, but stop if you develop a rash or swelling and contact our office as soon as possible. 3)  Acute sinusitis symptoms for less than 10 days are not helped by antibiotics.Use warm moist compresses, and over the counter decongestants ( only as directed). Call if no improvement in 5-7 days, sooner if increasing pain, fever, or new symptoms. 4)  Return or go to the ER if no improvement or symptoms getting worse.   5)  The patient was informed that there is no on-call provider or services available at this clinic during off-hours (when the clinic is closed).  If the patient developed a problem or concern that required immediate attention, the patient was advised to go the the nearest available urgent care or emergency department for medical care.  The patient verbalized understanding.    6)  Check your Blood Pressure regularly. If it is above: 140/90 you should make an appointment.

## 2010-11-15 LAB — DIFFERENTIAL
Basophils Absolute: 0.1 10*3/uL (ref 0.0–0.1)
Basophils Relative: 1 % (ref 0–1)
Eosinophils Absolute: 0.2 10*3/uL (ref 0.0–0.7)
Eosinophils Relative: 2 % (ref 0–5)
Lymphocytes Relative: 46 % (ref 12–46)
Lymphs Abs: 3.8 10*3/uL (ref 0.7–4.0)
Monocytes Absolute: 0.8 10*3/uL (ref 0.1–1.0)
Monocytes Relative: 9 % (ref 3–12)
Neutro Abs: 3.4 10*3/uL (ref 1.7–7.7)
Neutrophils Relative %: 41 % — ABNORMAL LOW (ref 43–77)

## 2010-11-15 LAB — POCT CARDIAC MARKERS
CKMB, poc: <1 ng/mL — ABNORMAL LOW (ref 1.0–8.0)
Myoglobin, poc: 54.9 ng/mL (ref 12–200)
Troponin i, poc: <0.05 ng/mL (ref 0.00–0.09)

## 2010-11-15 LAB — BASIC METABOLIC PANEL
BUN: 5 mg/dL — ABNORMAL LOW (ref 6–23)
CO2: 26 mEq/L (ref 19–32)
Calcium: 9.1 mg/dL (ref 8.4–10.5)
Chloride: 101 mEq/L (ref 96–112)
Creatinine, Ser: 0.63 mg/dL (ref 0.4–1.2)
GFR calc Af Amer: >60 mL/min (ref >60)
GFR calc non Af Amer: >60 mL/min (ref >60)
Glucose, Bld: 100 mg/dL — ABNORMAL HIGH (ref 70–99)
Potassium: 3.8 mEq/L (ref 3.5–5.1)
Sodium: 133 mEq/L — ABNORMAL LOW (ref 135–145)

## 2010-11-15 LAB — URINALYSIS, ROUTINE W REFLEX MICROSCOPIC
Bilirubin Urine: NEGATIVE
Glucose, UA: NEGATIVE mg/dL
Hgb urine dipstick: NEGATIVE
Ketones, ur: NEGATIVE mg/dL
Nitrite: NEGATIVE
Protein, ur: NEGATIVE mg/dL
Specific Gravity, Urine: 1.015 (ref 1.005–1.030)
Urobilinogen, UA: 0.2 mg/dL (ref 0.0–1.0)
pH: 6.5 (ref 5.0–8.0)

## 2010-11-15 LAB — CBC
HCT: 41.4 % (ref 36.0–46.0)
Hemoglobin: 14.5 g/dL (ref 12.0–15.0)
MCHC: 35 g/dL (ref 30.0–36.0)
MCV: 88.2 fL (ref 78.0–100.0)
Platelets: 314 10*3/uL (ref 150–400)
RBC: 4.7 MIL/uL (ref 3.87–5.11)
RDW: 13.8 % (ref 11.5–15.5)
WBC: 8.2 10*3/uL (ref 4.0–10.5)

## 2010-11-15 LAB — RAPID URINE DRUG SCREEN, HOSP PERFORMED
Amphetamines: NOT DETECTED
Barbiturates: NOT DETECTED
Benzodiazepines: NOT DETECTED
Cocaine: NOT DETECTED
Opiates: NOT DETECTED
Tetrahydrocannabinol: NOT DETECTED

## 2010-11-15 LAB — D-DIMER, QUANTITATIVE (NOT AT ARMC): D-Dimer, Quant: 0.22 ug/mL-FEU (ref 0.00–0.48)

## 2010-11-15 LAB — PREGNANCY, URINE: Preg Test, Ur: NEGATIVE

## 2010-11-28 NOTE — Letter (Signed)
Summary: History Form  History Form   Imported By: Eugenio Hoes 11/22/2010 12:39:29  _____________________________________________________________________  External Attachment:    Type:   Image     Comment:   External Document

## 2010-11-29 ENCOUNTER — Other Ambulatory Visit: Payer: Self-pay | Admitting: *Deleted

## 2010-11-29 MED ORDER — RANITIDINE HCL 300 MG PO TABS: 300.0000 mg | ORAL_TABLET | Freq: Every day | ORAL | Status: DC

## 2010-12-05 ENCOUNTER — Telehealth: Payer: Self-pay

## 2010-12-05 NOTE — Telephone Encounter (Signed)
pls let her know she can have depomedrol 80mg  iM tomorrow, enter as a nurse visit only. I will enter a prednisone dose pack to be sent after you spk with her

## 2010-12-06 ENCOUNTER — Ambulatory Visit (INDEPENDENT_AMBULATORY_CARE_PROVIDER_SITE_OTHER): Payer: Self-pay | Admitting: Family Medicine

## 2010-12-06 VITALS — BP 100/78 | Wt 125.4 lb

## 2010-12-06 DIAGNOSIS — J302 Other seasonal allergic rhinitis: Secondary | ICD-10-CM

## 2010-12-06 DIAGNOSIS — J309 Allergic rhinitis, unspecified: Secondary | ICD-10-CM

## 2010-12-06 MED ORDER — METHYLPREDNISOLONE ACETATE 80 MG/ML IJ SUSP
80.0000 mg | Freq: Once | INTRAMUSCULAR | Status: AC
  Administered 2010-12-06: 80 mg via INTRAMUSCULAR

## 2010-12-06 NOTE — Telephone Encounter (Signed)
pls also send in the prednisone 5mg  dose pack for 6 days only #21 , if she wants it, I was unable to send it yesterday, thanks

## 2010-12-06 NOTE — Progress Notes (Signed)
Depo medrol given per Dr. Lodema Hong

## 2010-12-07 ENCOUNTER — Encounter: Payer: Self-pay | Admitting: Family Medicine

## 2010-12-19 ENCOUNTER — Ambulatory Visit (INDEPENDENT_AMBULATORY_CARE_PROVIDER_SITE_OTHER): Payer: Self-pay | Admitting: Family Medicine

## 2010-12-19 ENCOUNTER — Encounter: Payer: Self-pay | Admitting: Family Medicine

## 2010-12-19 ENCOUNTER — Other Ambulatory Visit: Payer: Self-pay | Admitting: Family Medicine

## 2010-12-19 VITALS — BP 110/60 | HR 81 | Resp 16 | Ht 60.75 in | Wt 126.0 lb

## 2010-12-19 DIAGNOSIS — K219 Gastro-esophageal reflux disease without esophagitis: Secondary | ICD-10-CM

## 2010-12-19 DIAGNOSIS — E559 Vitamin D deficiency, unspecified: Secondary | ICD-10-CM

## 2010-12-19 DIAGNOSIS — R1013 Epigastric pain: Secondary | ICD-10-CM

## 2010-12-19 DIAGNOSIS — R5383 Other fatigue: Secondary | ICD-10-CM

## 2010-12-19 DIAGNOSIS — R109 Unspecified abdominal pain: Secondary | ICD-10-CM

## 2010-12-19 DIAGNOSIS — M899 Disorder of bone, unspecified: Secondary | ICD-10-CM

## 2010-12-19 DIAGNOSIS — E785 Hyperlipidemia, unspecified: Secondary | ICD-10-CM

## 2010-12-19 DIAGNOSIS — K3189 Other diseases of stomach and duodenum: Secondary | ICD-10-CM

## 2010-12-19 DIAGNOSIS — Z13220 Encounter for screening for lipoid disorders: Secondary | ICD-10-CM

## 2010-12-19 DIAGNOSIS — R5381 Other malaise: Secondary | ICD-10-CM

## 2010-12-19 DIAGNOSIS — M949 Disorder of cartilage, unspecified: Secondary | ICD-10-CM

## 2010-12-19 NOTE — Patient Instructions (Signed)
F/u in 6 months.  It is important that you exercise regularly at least 30 minutes 5 times a week. If you develop chest pain, have severe difficulty breathing, or feel very tired, stop exercising immediately and seek medical attention    You are SLIGHTLY  OVERWEIGHT A healthy diet is rich in fruit, vegetables and whole grains. Poultry fish, nuts and beans are a healthy choice for protein rather then red meat. A low sodium diet and drinking 64 ounces of water daily is generally recommended. Oils and sweet should be limited. Carbohydrates especially for those who are diabetic or overweight, should be limited to 34-45 gram per meal. It is important to eat on a regular schedule, at least 3 times daily. Snacks should be primarily fruits, vegetables or nuts.   You are being referred to [facility] about your abdominal probs.  You need a mammogram pls schedule asap  Fasting labs asap Cbc, chem 7, lipid, tsh , vit d, h pylori

## 2010-12-19 NOTE — Progress Notes (Signed)
  Subjective:    Patient ID: [name] [name], female    DOB: [birth year], 40 y.o.   MRN: [id number]  HPI C/o recurrent abdominal pain and bloating , excessive belching, loud noises in the stomach. Alternating constipation and diarreah.  No exercise on a regular basis. Reports improvement in stress and depression since has increased employment Recently had pelvic and pap at gynae    Review of Systems Denies recent fever or chills. Denies sinus pressure, nasal congestion, ear pain or sore throat. Denies chest congestion, productive cough or wheezing. Denies chest pains, palpitations, paroxysmal nocturnal dyspnea, orthopnea and leg swelling   Denies rectal bleeding or change in bowel movement. Denies dysuria, frequency, hesitancy or incontinence. Denies joint pain, swelling and limitation in mobility. Denies headaches, seizure, numbness, or tingling. Denies depression, anxiety or insomnia. Denies skin break down or rash.        Objective:   Physical Exam Pleasant well nourished female, alert and oriented x 3, in no cardio-pulmonary distress. Afebrile. HEENT No facial trauma or asymetry.   EOMI, PERTL, fundoscopic exam is normal, no hemorhage or exudate. No papiledema External ears normal, tympanic membranes clear. Oropharynx moist, no exudate, good dentition. Neck: supple, no adenopathy,JVD or thyromegaly.No bruits.  Chest: Clear to ascultation bilaterally.No crackles or wheezes. Non tender to palpation  Breast: No asymetry,no masses. No nipple discharge or inversion. No axillary or supraclavicular adenopathy  Cardiovascular system; Heart sounds normal,  S1 and  S2 ,no S3.  No murmur, or thrill. Apical beat not displaced Peripheral pulses normal.  Abdomen: Soft, non tender, no organomegaly or masses. No bruits. Bowel sounds normal. No guarding, tenderness or rebound.  Rectal: Not done, referred for GI eval due to abd symptoms  GU: not done, recent gynae  eval Musculoskeletal exam: Full ROM of spine, hips , shoulders and knees. No deformity ,swelling or crepitus noted. No muscle wasting or atrophy.   Neurologic: Cranial nerves 2 to 12 intact. Power, tone ,sensation and reflexes normal throughout. No disturbance in gait. No tremor.  Skin: Intact, no ulceration, erythema , scaling or rash noted. Pigmentation normal throughout  Psych; Normal mood and affect. Judgement and concentration normal        Assessment & Plan:

## 2010-12-20 ENCOUNTER — Other Ambulatory Visit: Payer: Self-pay | Admitting: Family Medicine

## 2010-12-20 ENCOUNTER — Encounter: Payer: Self-pay | Admitting: Family Medicine

## 2010-12-20 DIAGNOSIS — Z139 Encounter for screening, unspecified: Secondary | ICD-10-CM

## 2010-12-20 LAB — CBC WITH DIFFERENTIAL/PLATELET
Basophils Absolute: 0.1 10*3/uL (ref 0.0–0.1)
Basophils Relative: 1 % (ref 0–1)
Eosinophils Absolute: 0.1 10*3/uL (ref 0.0–0.7)
Eosinophils Relative: 2 % (ref 0–5)
HCT: 46 % (ref 36.0–46.0)
Hemoglobin: 15.5 g/dL — ABNORMAL HIGH (ref 12.0–15.0)
Lymphocytes Relative: 39 % (ref 12–46)
Lymphs Abs: 3.1 10*3/uL (ref 0.7–4.0)
MCH: 30.3 pg (ref 26.0–34.0)
MCHC: 33.7 g/dL (ref 30.0–36.0)
MCV: 89.8 fL (ref 78.0–100.0)
Monocytes Absolute: 0.7 10*3/uL (ref 0.1–1.0)
Monocytes Relative: 9 % (ref 3–12)
Neutro Abs: 3.9 10*3/uL (ref 1.7–7.7)
Neutrophils Relative %: 50 % (ref 43–77)
Platelets: 366 10*3/uL (ref 150–400)
RBC: 5.12 MIL/uL — ABNORMAL HIGH (ref 3.87–5.11)
RDW: 13.9 % (ref 11.5–15.5)
WBC: 7.9 10*3/uL (ref 4.0–10.5)

## 2010-12-20 LAB — BASIC METABOLIC PANEL
BUN: 9 mg/dL (ref 6–23)
CO2: 26 mEq/L (ref 19–32)
Calcium: 9.8 mg/dL (ref 8.4–10.5)
Chloride: 104 mEq/L (ref 96–112)
Creat: 0.78 mg/dL (ref 0.40–1.20)
Glucose, Bld: 90 mg/dL (ref 70–99)
Potassium: 4.5 mEq/L (ref 3.5–5.3)
Sodium: 139 mEq/L (ref 135–145)

## 2010-12-20 LAB — LIPID PANEL
Cholesterol: 252 mg/dL — ABNORMAL HIGH (ref 0–200)
HDL: 65 mg/dL (ref >39)
LDL Cholesterol: 171 mg/dL — ABNORMAL HIGH (ref 0–99)
Total CHOL/HDL Ratio: 3.9 Ratio
Triglycerides: 78 mg/dL (ref <150)
VLDL: 16 mg/dL (ref 0–40)

## 2010-12-21 ENCOUNTER — Encounter: Payer: Self-pay | Admitting: Family Medicine

## 2010-12-21 LAB — H. PYLORI ANTIBODY, IGG: H Pylori IgG: <0.4 {ISR}

## 2010-12-21 LAB — VITAMIN D 25 HYDROXY (VIT D DEFICIENCY, FRACTURES): Vit D, 25-Hydroxy: 16 ng/mL — ABNORMAL LOW (ref 30–89)

## 2010-12-21 LAB — TSH: TSH: 3.043 u[IU]/mL (ref 0.350–4.500)

## 2010-12-22 ENCOUNTER — Telehealth: Payer: Self-pay | Admitting: Family Medicine

## 2010-12-22 ENCOUNTER — Ambulatory Visit (HOSPITAL_COMMUNITY)
Admission: RE | Admit: 2010-12-22 | Discharge: 2010-12-22 | Disposition: A | Discharge status: Home / Self Care | Payer: Self-pay | Source: Ambulatory Visit | Attending: Family Medicine | Admitting: Family Medicine

## 2010-12-22 DIAGNOSIS — Z139 Encounter for screening, unspecified: Secondary | ICD-10-CM

## 2010-12-22 LAB — HEPATIC FUNCTION PANEL
ALT: 11 U/L (ref 0–35)
AST: 16 U/L (ref 0–37)
Albumin: 4.2 g/dL (ref 3.5–5.2)
Alkaline Phosphatase: 67 U/L (ref 39–117)
Bilirubin, Direct: 0.1 mg/dL (ref 0.0–0.3)
Indirect Bilirubin: 0.3 mg/dL (ref 0.0–0.9)
Total Bilirubin: 0.4 mg/dL (ref 0.3–1.2)
Total Protein: 7.2 g/dL (ref 6.0–8.3)

## 2010-12-22 MED ORDER — VITAMIN D (ERGOCALCIFEROL) 1.25 MG (50000 UNIT) PO CAPS: 50000.0000 [IU] | ORAL_CAPSULE | ORAL | Status: DC

## 2010-12-22 NOTE — Telephone Encounter (Deleted)
error 

## 2010-12-24 DIAGNOSIS — E559 Vitamin D deficiency, unspecified: Secondary | ICD-10-CM | POA: Insufficient documentation

## 2010-12-24 NOTE — Assessment & Plan Note (Signed)
Deteriorated, GI eval scheduled

## 2010-12-24 NOTE — Assessment & Plan Note (Signed)
Pt to start weekly supplement based on ;lab

## 2010-12-24 NOTE — Assessment & Plan Note (Signed)
Low fat diet discussed, labs to be obtained

## 2010-12-25 ENCOUNTER — Other Ambulatory Visit: Payer: Self-pay | Admitting: Family Medicine

## 2010-12-26 MED ORDER — PRAVASTATIN SODIUM 40 MG PO TABS: 40.0000 mg | ORAL_TABLET | Freq: Every evening | ORAL | Status: DC

## 2010-12-26 NOTE — Progress Notes (Signed)
Addended by: Everitt Amber on: 12/26/2010 10:17 AM   Modules accepted: Orders

## 2010-12-27 ENCOUNTER — Encounter: Payer: Self-pay | Admitting: Urgent Care

## 2010-12-27 ENCOUNTER — Ambulatory Visit (INDEPENDENT_AMBULATORY_CARE_PROVIDER_SITE_OTHER): Payer: Self-pay | Admitting: Urgent Care

## 2010-12-27 VITALS — BP 139/86 | HR 76 | Temp 97.1°F | Ht 61.0 in | Wt 125.2 lb

## 2010-12-27 DIAGNOSIS — R1310 Dysphagia, unspecified: Secondary | ICD-10-CM | POA: Insufficient documentation

## 2010-12-27 DIAGNOSIS — K219 Gastro-esophageal reflux disease without esophagitis: Secondary | ICD-10-CM

## 2010-12-27 DIAGNOSIS — K921 Melena: Secondary | ICD-10-CM | POA: Insufficient documentation

## 2010-12-27 MED ORDER — OMEPRAZOLE 20 MG PO CPDR: 20.0000 mg | DELAYED_RELEASE_CAPSULE | Freq: Every day | ORAL | Status: DC

## 2010-12-27 NOTE — Progress Notes (Signed)
Cc to PCP 

## 2010-12-27 NOTE — Patient Instructions (Signed)
Lactose Free diet Begin calcium 600mg  twice per day Begin probiotic of choice daily--ALIGN, [facility]IL[facility]'s COLON HEALTH, RESTORA Begin omeprazole 20mg  daily  Lactose Free Diet Lactose is a carbohydrate that is found mainly in milk and milk products, as well as in foods with added milk or whey. Lactose must be digested by the enzyme in order to be used by the body. Lactose intolerance occurs when there is a shortage of lactase. When your body is not able to digest lactose, you may feel sick to your stomach (nausea), bloating, cramping, gas and diarrhea. TYPES OF LACTASE DEFICIENCY  Primary lactase deficiency. This is the most common type. It is characterized by a slow decrease in lactase activity.   Secondary lactase deficiency. This occurs following injury to the small intestinal mucosa as a result of diseases such as celiac disease, nontropical sprue, infectious gastroenteritis (stomach virus), malnutrition, parasites, or inflammatory bowel disease. It can also occur after treatment with medications that kill germs (antibiotics) or cancer drugs, or as a result of surgery.  Tolerance to lactose varies widely, and each person must determine how much milk can be consumed without developing symptoms. Drinking smaller portions of milk throughout the day may be helpful. Some studies suggest that slowing gastric emptying may help increase tolerance of milk products. This may be done by:  Consuming milk or milk products with a meal rather than alone.   Using milk with a higher fat content.  There are many dairy products that may be tolerated better than milk by some people:  Cheese (especially aged cheese) - the lactose content is much lower than in milk.   The use of cultured dairy products such as yogurt, buttermilk, cottage cheese, and sweet acidophilus milk (Kefir) for lactase-deficient individuals is usually well tolerated. This is because the healthy bacteria help digest lactose.    Lactose-hydrolyzed milk (Lactaid) contains 40-90% less lactose than milk and may also be well tolerated.  ADEQUACY These diets may be deficient in calcium, riboflavin, and vitamin D, according to the Recommended Dietary Allowances of the [employer]. Depending on individual tolerances and the use of milk substitutes, milk, or other dairy products, these recommendations may be met. SPECIAL NOTES  Lactose is a carbohydrates. The major food source is dairy products. Reading food labels is important. Many products contain lactose even when they are not made from milk. Look for the following words: whey, milk solids, dry milk solids, nonfat dry milk powder. Typical sources of lactose other than dairy products include breads, candies, cold cuts, prepared and processed foods, and commercial sauces and gravies.   All foods must be prepared without milk, cream, or other dairy foods.   A vitamin/mineral supplement may be necessary. Consult your physician or Registered Dietitian.   Lactose also is found in many prescription and over-the-counter medications.   Soy milk and lactose-free supplements may be used as an alternative to milk.  FOOD GROUP ALLOWED/RECOMMENDED AVOID/USE SPARINGLY  BREADS / STARCHES 4 servings or more* Breads and rolls made without milk. [place], [place], or [place] bread. Breads and rolls that contain milk. Prepared mixes such as muffins, biscuits, waffles, pancakes. Sweet rolls, donuts, [place] toast (if made with milk or lactose).  Crackers: Soda crackers, graham crackers. Any crackers prepared without lactose. Zwieback crackers, corn curls, or any that contain lactose.  Cereals: Cooked or dry cereals prepared without lactose (read labels). Cooked or dry cereals prepared with lactose (read labels). Total, Cocoa Krispies. Special K.  Potatoes / Pasta / Rice: Any  prepared without milk or lactose. Popcorn. Instant potatoes, frozen [place] fries, scalloped or au gratin  potatoes.  VEGETABLES 2 servings or more Fresh, frozen, and canned vegetables. Creamed or breaded vegetables. Vegetables in a cheese sauce or with lactose-containing margarines.  FRUIT 2 servings or more All fresh, canned, or frozen fruits that are not processed with lactose. Any canned or frozen fruits processed with lactose.  MEAT & SUBSTITUTES 2 servings or more (4 to 6 oz. total per day) Plain beef, chicken, fish, [place], lamb, veal, pork, or ham. Kosher prepared meat products. Strained or junior meats that do not contain milk. Eggs, soy meat substitutes, nuts. Scrambled eggs, omelets, and souffles that contain milk. Creamed or breaded meat, fish, or fowl. Sausage products such as wieners, liver sausage, or cold cuts that contain milk solids. Cheese, cottage cheese, or cheese spreads.  MILK None. (See "BEVERAGES" for milk substitutes. See "DESSERTS" for ice cream and frozen desserts.) Milk (whole, 2%, skim, or chocolate). Evaporated, powdered, or condensed milk; malted milk.  SOUPS & COMBINATION FOODS Bouillon, broth, vegetable soups, clear soups, consomms. Homemade soups made with allowed ingredients. Combination or prepared foods that do not contain milk or milk products (read labels). Cream soups, chowders, commercially prepared soups containing lactose. Macaroni and cheese, pizza. Combination or prepared foods that contain milk or milk products.  DESSERTS & SWEETS In moderation Water and fruit ices; gelatin; angel food cake. Homemade cookies, pies, or cakes made from allowed ingredients. Pudding (if made with water or a milk substitute). Lactose-free tofu desserts. Sugar, honey, corn syrup, jam, jelly; marmalade; molasses (beet sugar); Pure sugar candy; marshmallows. Ice cream, ice milk, sherbet, custard, pudding, frozen yogurt. Commercial cake and cookie mixes. Desserts that contain chocolate. Pie crust made with milk-containing margarine; reduced-calorie desserts made with a sugar substitute  that contains lactose. Toffee, peppermint, butterscotch, chocolate, caramels.  FATS & OILS In moderation Butter (as tolerated; contains very small amounts of lactose). Margarines and dressings that do not contain milk, Vegetable oils, shortening, Miracle Whip, mayonnaise, nondairy cream & whipped toppings without lactose or milk solids added (examples: Coffee Rich, Carnation Coffeemate, Rich's Whipped Topping, PolyRich). Tomasa Blase. Margarines and salad dressings containing milk; cream, cream cheese; peanut butter with added milk solids, sour cream, chip dips, made with sour cream.  BEVERAGES Carbonated drinks; tea; coffee and freeze-dried coffee; some instant coffees (check labels). Fruit drinks; fruit and vegetable juice; Rice or Soy milk. Ovaltine, hot chocolate. Some cocoas; some instant coffees; instant iced teas; powdered fruit drinks (read labels).   CONDIMENTS / MISCELLANEOUS Soy sauce, carob powder, olives, gravy made with water, baker's cocoa, pickles, pure seasonings and spices, wine, pure monosodium glutamate, catsup, mustard. Some chewing gums, chocolate, some cocoas. Certain antibiotics and vitamin / mineral preparations. Spice blends if they contain milk products. MSG extender. Artificial sweeteners that contain lactose such as Equal (Nutra-Sweet) and Sweet 'n Low. Some nondairy creamers (read labels).  * These amounts indicate the minimum number of servings needed from the basic food groups to provide a variety of nutrients essential to good health. A maximum amount is listed if intake of certain foods must be controlled. Combination foods may count as full or partial servings from the food groups. Dark green, leafy, or orange vegetables are recommended 3 or 4 times weekly to provide vitamin A. A good source of vitamin C is recommended daily. Potatoes may be included as a serving of vegetables. SAMPLE MENU*  Breakfast   Orange Juice.  Banana.   Bran flakes.  Nondairy  Creamer.  Vienna Bread (toasted).   Butter or milk-free margarine.   Coffee or tea.    Noon Meal   Chicken Breast.  Rice.   Green beans.   Butter or milk-free margarine.  Fresh melon.   Coffee or tea.    Evening Meal   Roast Beef.  Baked potato.   Butter or milk-free margarine.   Broccoli.   Lettuce salad with vinegar and oil dressing.  [employer].   Coffee or tea.   Document Released: 02/02/2002 Document Re-Released: 02/09/2008 [facility] Patient Information 2011 [place], [place].

## 2010-12-27 NOTE — Progress Notes (Signed)
Referring Provider: Syliva Overman, MD Primary Care Physician:  Syliva Overman, MD, MD Primary Gastroenterologist:  Dr. Darrick Penna  Chief Complaint  Patient presents with  . GI Problem    Hematochezia, GERD, dysphagia    HPI:  [name] [name] is a 40 y.o. female here as a referral from Dr. Lodema Hong for multiple GI concerns.  C/o acid reflux x 6-63mo.  "I feel like food stuck" & points to upper & mid- esophagus.  Problems mostly w/ solids foods.  On ranitidine for over 1 year, seems to help some, but breakthrough heartburn.  C/o gurgling in abd.  Certain foods bother stomach, such as [place] foods, tomato-based.  C/o multiple loose stools intermittently after eating.  Occ constipation with BM .  Normal BM 1-2 per day until 6-7 mo ago.  Denies mucus in stool.  Noticed bright red blood in commode after hard stool this AM.  Hx intermittent hematochezia over past couple months.  Abd cramps that are resolved post defecation.  Appetite ok.  Wt loss 15# in past year.  Eating less because afraid of stomach problems.    Lots of flatus.  Rare IBU for headaches.  Rare Goodys or BC powders a couple times per yr.  Always had a problem w/ dairy.    Past Medical History  Diagnosis Date  . Hyperlipidemia     x4 years   . Hypertension     whenshe wasa young but she changed diet and reducd salt intake   . Abnormal vaginal Pap smear     Dr Emelda Fear  . Allergic rhinitis   . Kidney stones     Past Surgical History  Procedure Date  . Cervical biopsy 2010    for abnormal pap   . Cyst removed from both breast , all benign both breast   . Breast reduction surgery Jan 2007    Current Outpatient Prescriptions  Medication Sig Dispense Refill  . Etonogestrel (IMPLANON) 68 MG IMPL Inject into the skin.        Marland Kitchen ibuprofen (ADVIL,MOTRIN) 800 MG tablet Take 800 mg by mouth. Take one by mouth two times a day as needed for uncontrolled headache       . loratadine (CLARITIN) 10 MG tablet Take 10 mg by mouth daily.         . pravastatin (PRAVACHOL) 40 MG tablet Take 1 tablet (40 mg total) by mouth every evening.  30 tablet  3  . ranitidine (ZANTAC) 300 MG tablet Take 1 tablet (300 mg total) by mouth at bedtime.  30 tablet  2  . Vitamin D, Ergocalciferol, (DRISDOL) 50000 UNITS CAPS Take 1 capsule (50,000 Units total) by mouth every 7 (seven) days.  4 capsule  5  . omeprazole (PRILOSEC) 20 MG capsule Take 1 capsule (20 mg total) by mouth daily.  30 capsule  11    Allergies as of 12/27/2010  . (No Known Allergies)    Family History: There is no known family history of colorectal carcinoma , liver disease, or inflammatory bowel disease.   Problem Relation Age of Onset  . Fibromyalgia Mother   . Hypertension Mother   . Thyroid disease Mother   . Arthritis Mother   . Alcohol abuse Father     History   Social History  . Marital Status: Single    Spouse Name: N/A    Number of Children: 0  . Years of Education: N/A   Occupational History  . student      nursing school  .  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 Smoking status: Former Smoker -- 0.2 packs/day for 20 years    Types: Cigarettes    Quit date: 06/05/2009  . Smokeless tobacco: Not on file  . Alcohol Use: Yes     occasionally, couple drinks per mo  . Drug Use: No  . Sexually Active: No   Review of Systems: Gen: Denies any fever, chills, sweats, anorexia, fatigue, weakness, malaise, and sleep disorder CV: Denies chest pain, angina, palpitations, syncope, orthopnea, PND, peripheral edema, and claudication. Resp: Denies dyspnea at rest, dyspnea with exercise, cough, sputum, wheezing, coughing up blood, and pleurisy. GI: Denies vomiting blood, jaundice, and fecal incontinence.  GU : Denies urinary burning, blood in urine, urinary frequency, urinary hesitancy, nocturnal urination, and urinary incontinence. MS: Denies joint pain, limitation of movement, and swelling, stiffness, low back pain, extremity pain.  Denies muscle weakness, cramps, atrophy.  Derm: Denies rash, itching, dry skin, hives, moles, warts, or unhealing ulcers.  Psych: Denies depression, anxiety, memory loss, suicidal ideation, hallucinations, paranoia, and confusion. Heme: Denies bruising, bleeding, and enlarged lymph nodes.  Physical Exam: BP 139/86  Pulse 76  Temp(Src) 97.1 F (36.2 C) (Tympanic)  Ht 5\' 1"  (1.549 m)  Wt 125 lb 3.2 oz (56.79 kg)  BMI 23.66 kg/m2  LMP 06/28/2007 General:   Alert,  Well-developed, well-nourished, pleasant and cooperative in NAD Head:  Normocephalic and atraumatic. Eyes:  Sclera clear, no icterus.   Conjunctiva pink. Ears:  Normal auditory acuity. Nose:  No deformity, discharge,  or lesions. Mouth:  No deformity or lesions, dentition normal. Neck:  Supple; no masses or thyromegaly. Lungs:  Clear throughout to auscultation.   No wheezes, crackles, or rhonchi. No acute distress. Heart:  Regular rate and rhythm; no murmurs, clicks, rubs,  or gallops. Abdomen:  Soft, nontender and nondistended. No masses, hepatosplenomegaly or hernias noted. Normal bowel sounds, without guarding, and without rebound.   Rectal:  Deferred until time of colonoscopy.   Msk:  Symmetrical without gross deformities. Normal posture. Pulses:  Normal pulses noted. Extremities:  Without clubbing or edema. Neurologic:  Alert and  oriented x4;  grossly normal neurologically. Skin:  Intact without significant lesions or rashes. Cervical Nodes:  No significant cervical adenopathy. Psych:  Alert and cooperative. Normal mood and affect.

## 2010-12-27 NOTE — Assessment & Plan Note (Addendum)
Recent change in bowel habits alternation between post-prandial loose stools to constipation w/ moderate volume hematochezia.  She will need further evaluation w/ colonoscopy to r/o inflammatory bowel disease, diverticular bleeding, colorectal ca or polyp or benign anorectal source such as hemorrhoid or fissure.  She may also have lactose intolerance.  Trial of lactose-free diet & calcium 600mg  twice per day Begin probiotic of choice daily--ALIGN, [facility]ILIP's COLON HEALTH, RESTORA  I have discussed risks & benefits which include, but are not limited to, bleeding, infection, perforation & drug reaction.  The patient agrees with this plan & written consent will be obtained.

## 2010-12-27 NOTE — Assessment & Plan Note (Signed)
Suspect esophageal web, ring, or stricture.  For EGD w/ esophageal dilation.

## 2010-12-27 NOTE — Assessment & Plan Note (Addendum)
[name] [name] is a 40 y.o. black female w/ hx GERD on ranitidine, now w/ multiple GI concerns including persistent heartburn & solid food dysphagia.  She has also had unintentional weight loss of 15# in 1 year.  She will need further evaluation w/ EGD and possible esophageal dilation to r/o PUD, gastritis, erosive esophagitis, esophageal web, ring or stricture.  Begin omeprazole 20mg  daily.  Stop ranitidine.  I have discussed risks & benefits which include, but are not limited to, bleeding, infection, perforation & drug reaction.  The patient agrees with this plan & written consent will be obtained.

## 2010-12-29 ENCOUNTER — Ambulatory Visit (HOSPITAL_COMMUNITY)
Admission: RE | Admit: 2010-12-29 | Discharge: 2010-12-29 | Disposition: A | Discharge status: Home / Self Care | Payer: Self-pay | Source: Ambulatory Visit | Attending: Gastroenterology | Admitting: Gastroenterology

## 2010-12-29 ENCOUNTER — Encounter: Payer: Self-pay | Admitting: Gastroenterology

## 2010-12-29 ENCOUNTER — Other Ambulatory Visit: Payer: Self-pay | Admitting: Gastroenterology

## 2010-12-29 DIAGNOSIS — I10 Essential (primary) hypertension: Secondary | ICD-10-CM | POA: Insufficient documentation

## 2010-12-29 DIAGNOSIS — R197 Diarrhea, unspecified: Secondary | ICD-10-CM

## 2010-12-29 DIAGNOSIS — K219 Gastro-esophageal reflux disease without esophagitis: Secondary | ICD-10-CM

## 2010-12-29 DIAGNOSIS — K648 Other hemorrhoids: Secondary | ICD-10-CM | POA: Insufficient documentation

## 2010-12-29 DIAGNOSIS — K5909 Other constipation: Secondary | ICD-10-CM | POA: Insufficient documentation

## 2010-12-29 DIAGNOSIS — K5900 Constipation, unspecified: Secondary | ICD-10-CM

## 2010-12-29 DIAGNOSIS — K2940 Chronic atrophic gastritis without bleeding: Secondary | ICD-10-CM

## 2010-12-29 DIAGNOSIS — K625 Hemorrhage of anus and rectum: Secondary | ICD-10-CM | POA: Insufficient documentation

## 2010-12-29 DIAGNOSIS — Z79899 Other long term (current) drug therapy: Secondary | ICD-10-CM | POA: Insufficient documentation

## 2010-12-29 DIAGNOSIS — E785 Hyperlipidemia, unspecified: Secondary | ICD-10-CM | POA: Insufficient documentation

## 2011-01-01 ENCOUNTER — Telehealth: Payer: Self-pay | Admitting: Gastroenterology

## 2011-01-01 NOTE — Telephone Encounter (Signed)
Called by pathology. Fat seen on gastric Bx, suggests ? Full thickness Bx through the wall. Called pt's cell. LVM-call clinic at [phone]. Pt called back. Still having reflux issues. Swallowing pretty good. No abd pain. Just discomfort in chest from reflux.Marland Kitchen

## 2011-01-23 NOTE — Op Note (Signed)
NAME:  [name] [name]             ACCOUNT NO.:  [id number]  MEDICAL RECORD NO.:  [id number]           PATIENT TYPE:  O  LOCATION:  [facility]                          FACILITY:  [facility]  PHYSICIAN:  Jonette Eva, M.D.     DATE OF BIRTH:  June 11, 1971  DATE OF PROCEDURE:  12/29/2010 DATE OF DISCHARGE:                               PROCEDURE NOTE   REFERRING PROVIDER:  Milus Mallick. Lodema Hong, MD  PROCEDURES: 1. Ileocolonoscopy. 2. Esophagogastroduodenoscopy with cold forceps biopsy of the     esophageal and gastric mucosa.  INDICATION FOR EXAM:  [name] [name] is a 40 year old female who has a history of reflux disease which she was treated with ranitidine.  She also complained of bright red blood per rectum.  She has intermittent constipation and diarrhea.  She uses ibuprofen less than one to two times a week.  FINDINGS: 1. Normal terminal ileum without evidence of polyps, masses,     inflammatory changes, diverticular AVMs seen. 2. Moderate internal hemorrhoids.  Otherwise, normal retroflex view of     the rectum. 3. A feline-appearing esophagus.  Biopsies obtained 23 cm from the     teeth and 33 cm from the teeth.  The GE junction was 38 cm from the     teeth.  No evidence of Barrett's, mass, erosions, ulcerations, or     strictures. 4. Patchy erythema with occasional erosion in the antrum.  Biopsies     obtained via cold forceps to evaluate for H. pylori gastritis. 5. Patchy erythema in the duodenal bulb.  Normal ampulla and second     portion of the duodenum.  DIAGNOSES: 1. Rectal bleeding secondary to hemorrhoids. 2. Intermittent constipation and diarrhea, most likely secondary to     functional gut disorder or lactose intolerance. 3. Solid dysphagia, most likely secondary to uncontrolled     gastroesophageal reflux disease.  Biopsies obtained via cold     forceps to evaluate for eosinophilic esophagitis.  RECOMMENDATIONS: 1. She should continue Prilosec and take it 30 minutes  prior to meals. 2. She should take a probiotic daily.  We will call with the results     of her biopsies. No aspirin, NSAIDs, or anticoagulation for 3 days. 3. Follow in 3 months regarding her dysphagia and intermittent     constipation and diarrhea. 4. She should follow a high-fiber diet.  She is given handout on high-     fiber diet, hemorrhoids, reflux, and gastritis. 5. Screening colonoscopy in 10 years.  MEDICATIONS: 1. Demerol 75 mg IV. 2. Versed 5 mg IV.  PROCEDURE TECHNIQUE:  Physical exam was performed.  Informed consent was obtained from the patient after explaining the benefits, risks, and alternatives to the procedure.  The patient was connected to monitor and placed in the left lateral position.  Continuous oxygen was provided by nasal cannula and IV medicine administered through an indwelling cannula.  After administration of sedation and rectal exam, the patient's rectum was intubated and scope was advanced under direct visualization to the distal terminal ileum.  The scope was removed slowly by carefully examining color, texture, anatomy, and integrity of the mucosa  on the way out.  After the colonoscopy, the patient's esophagus was intubated with a diagnostic gastroscope.  The scope was advanced under direct visualization into the second portion of the duodenum.  The scope was removed slowly by carefully examining the color, texture, anatomy, and integrity of the mucosa on the way out.  The patient was recovered in endoscopy and discharged home in satisfactory condition.  PATH: ESO BX-GERD; MILD GASTRITIS-Contine Prilosec and probiotic. TCS in 10 years. High fiber diet.   Jonette Eva, M.D.     SF/MEDQ  D:  12/29/2010  T:  12/29/2010  Job:  [id number]  cc:   Milus Mallick. Lodema Hong, M.D. Fax: [phone]  Electronically Signed by Jonette Eva M.D. on 01/23/2011 03:30:38 PM

## 2011-03-30 ENCOUNTER — Ambulatory Visit (INDEPENDENT_AMBULATORY_CARE_PROVIDER_SITE_OTHER): Payer: Self-pay | Admitting: Family Medicine

## 2011-03-30 ENCOUNTER — Encounter: Payer: Self-pay | Admitting: Family Medicine

## 2011-03-30 VITALS — BP 120/88 | HR 91 | Resp 16 | Ht 61.0 in | Wt 128.4 lb

## 2011-03-30 DIAGNOSIS — R51 Headache: Secondary | ICD-10-CM

## 2011-03-30 MED ORDER — AMITRIPTYLINE HCL 25 MG PO TABS: 25.0000 mg | ORAL_TABLET | Freq: Every day | ORAL | Status: DC

## 2011-03-30 NOTE — Patient Instructions (Addendum)
    Tension Headache (Muscle Contraction Headache) Tension headache is one of the most common causes of head pain. These headaches are usually felt as a pain over the top of your head and back of your neck. Stress, anxiety, and depression are common triggers for these headaches. Tension headaches are not life-threatening and will not lead to other types of headaches. Tension headaches can often be diagnosed by taking a history from the patient and a physical exam. Sometimes, further lab and x-ray studies are used to confirm the diagnosis. Your caregiver can advise you on how to get help solving problems that cause anxiety or stress. Antidepressants can be prescribed if depression is a problem. HOME CARE INSTRUCTIONS  If testing was done, call for your results. Remember, it is your responsibility to get the results of all testing. Do not assume everything is fine because you do not hear from your caregiver.   Only take over-the-counter or prescription medicines for pain, discomfort, or fever as directed by your caregiver.   Biofeedback, massage, or other relaxation techniques may be helpful.   Ice packs or heat to the head and neck can be used. Use these three to four times per day or as needed.   Physical therapy may be a useful addition to treatment.   If headaches continue, even with therapy, you may need to think about lifestyle changes.   Avoid excessive use of pain killers, as rebound headaches can occur.  1.  SEEK MEDICAL CARE IF:  You develop problems with medications prescribed.   You do not respond or get no relief from medications.   You have a change from the usual headache.   You develop nausea (feeling sick to your stomach) or vomiting.  SEEK IMMEDIATE MEDICAL CARE IF:   Your headache becomes severe.   You have an unexplained oral temperature above   You develop a stiff neck.   You have loss of vision.   You have muscular weakness.   You have loss of muscular  control.   You develop severe symptoms different from your first symptoms.   You start losing your balance or have trouble walking.   You feel faint or pass out.  MAKE SURE YOU:   Understand these instructions.   Will watch your condition.   Will get help right away if you are not doing well or get worse.  Document Released: 08/13/2005 Document Re-Released: 11/09/2008 [facility] Patient Information 2011 [place], [place].  Start the Amitryptiline at bedtime Use the ibuprofen sparingly  Follow-up in 4 weeks for headaches

## 2011-03-30 NOTE — Assessment & Plan Note (Signed)
We discussed multiple options to help treat her headaches. At this time she is not able to leave her job therefore will have to deal with the headaches while working. Of note she has not missed any work secondary to headaches. Although she was reluctant to take daily medication initially we will start treatment with amitriptyline 25 mg at bedtime. A huge factor in treating her headaches is that she is uninsured. Patient will followup in 4 weeks to see if amitriptyline is helping. We also discussed massage and sparingly use of ibuprofen. Other options to treat her tension headache with the Topamax or when necessary Imitrex.

## 2011-03-30 NOTE — Progress Notes (Signed)
  Subjective:    Patient ID: [name] [name], female    DOB: 1971-08-19, 40 y.o.   MRN: [id number]  HPI   Headache- daily shortness and tension headaches for many months. Patient states she only has headaches when she has to go to work which is a very stressful atmosphere. When she is not working she does not typically have daily headaches. Headaches are described as tightness and tension in the back of her head as well as across the 4 head bilaterally. She has occasional phonophobia associated but denies nausea or vomiting. She has been using ibuprofen almost daily to control headaches. Headaches typically last her entire shift unless she is able to step away and take a break from the job. She denies any neck pain tingling or numbness in the upper extremities. He does admit she on occasion has a tingling sensation in her face but is unsure if this occurs before her headaches appear or during them. There has been no head trauma and no change in vision. Of note patient did not notice any change in her headaches after her implanon was placed last year.  This does not feel like her typical sinus headache  Review of Systems  Constitutional: Negative for fever and fatigue.  HENT: Negative for congestion, facial swelling, rhinorrhea, neck pain and neck stiffness.   Eyes: Negative for photophobia, pain, discharge and visual disturbance.  Respiratory: Negative for chest tightness.   Cardiovascular: Negative for chest pain and palpitations.  Neurological: Positive for headaches. Negative for dizziness, syncope, speech difficulty and numbness.       Objective:   Physical Exam  GEN- NAD, alert and oriented x3  HEENT- PERRL, Fundoscopic exam benign, oropharynx clear  NECK- normal ROM- no spasm noted  CVS-RRR, no murmur RESP- CTAB  Neuro- CN II-XII in tact, DTR symmetric bilat upper ext, sensation in tact , motor equal bilat upper ext      Assessment & Plan:

## 2011-04-09 ENCOUNTER — Other Ambulatory Visit: Payer: Self-pay | Admitting: Family Medicine

## 2011-04-09 ENCOUNTER — Telehealth: Payer: Self-pay

## 2011-04-09 DIAGNOSIS — R51 Headache: Secondary | ICD-10-CM

## 2011-04-09 MED ORDER — BUTALBITAL-APAP-CAFFEINE 50-500-40 MG PO TABS: 1.0000 | ORAL_TABLET | ORAL | Status: AC | PRN

## 2011-04-09 MED ORDER — TRAMADOL HCL 50 MG PO TABS: 50.0000 mg | ORAL_TABLET | Freq: Four times a day (QID) | ORAL | Status: DC | PRN

## 2011-04-09 NOTE — Telephone Encounter (Signed)
This is to be cancelled, pt recently sseen by Dr Jeanice Lim who is handling her pain med for headache currently, pls send d/c order to pharmacy

## 2011-04-09 NOTE — Telephone Encounter (Signed)
I spoke with [name] [name], she has just started the amitryptiline which will take some time to set in. She is still having severe HA on the job. Will give her prn Fiorcet for short term, continue Amitryptiline.

## 2011-04-09 NOTE — Telephone Encounter (Signed)
Noted I am send a cancellation on tramadol which i had written for, thanks

## 2011-05-03 ENCOUNTER — Ambulatory Visit: Payer: Self-pay | Admitting: Family Medicine

## 2011-06-13 ENCOUNTER — Telehealth: Payer: Self-pay | Admitting: Family Medicine

## 2011-06-13 MED ORDER — GUAIFENESIN-CODEINE 100-10 MG/5ML PO SYRP: 5.0000 mL | ORAL_SOLUTION | Freq: Three times a day (TID) | ORAL | Status: DC | PRN

## 2011-06-13 NOTE — Telephone Encounter (Signed)
Low grade fever, cough with green mucus production. Received a zpak from her work and was wanting some cough medicine sent in to [facility]

## 2011-06-13 NOTE — Telephone Encounter (Signed)
Robitussin AC sent in to pharmacy via phone

## 2011-06-14 ENCOUNTER — Emergency Department (HOSPITAL_COMMUNITY)
Admission: EM | Admit: 2011-06-14 | Discharge: 2011-06-14 | Disposition: A | Discharge status: Home / Self Care | Payer: Self-pay | Attending: Emergency Medicine | Admitting: Emergency Medicine

## 2011-06-14 ENCOUNTER — Encounter (HOSPITAL_COMMUNITY): Payer: Self-pay | Admitting: *Deleted

## 2011-06-14 DIAGNOSIS — R059 Cough, unspecified: Secondary | ICD-10-CM | POA: Insufficient documentation

## 2011-06-14 DIAGNOSIS — Z87891 Personal history of nicotine dependence: Secondary | ICD-10-CM | POA: Insufficient documentation

## 2011-06-14 DIAGNOSIS — R05 Cough: Secondary | ICD-10-CM | POA: Insufficient documentation

## 2011-06-14 DIAGNOSIS — E785 Hyperlipidemia, unspecified: Secondary | ICD-10-CM | POA: Insufficient documentation

## 2011-06-14 DIAGNOSIS — R6889 Other general symptoms and signs: Secondary | ICD-10-CM | POA: Insufficient documentation

## 2011-06-14 DIAGNOSIS — J3489 Other specified disorders of nose and nasal sinuses: Secondary | ICD-10-CM | POA: Insufficient documentation

## 2011-06-14 DIAGNOSIS — R040 Epistaxis: Secondary | ICD-10-CM

## 2011-06-14 MED ORDER — OXYMETAZOLINE HCL 0.05 % NA SOLN
1.0000 | Freq: Once | NASAL | Status: AC
  Administered 2011-06-14: 1 via NASAL
  Filled 2011-06-14: qty 15

## 2011-06-14 NOTE — ED Provider Notes (Signed)
History     CSN: [id number] Arrival date & time: 06/14/2011  8:53 PM   None     Chief Complaint  Patient presents with  . Epistaxis    (Consider location/radiation/quality/duration/timing/severity/associated sxs/prior treatment) Patient is a 40 y.o. female presenting with nosebleeds. The history is provided by the patient. No language interpreter was used.  Epistaxis  This is a new problem. The current episode started 1 to 2 hours ago. The problem occurs constantly. The problem has been resolved. Associated with: URI and nose blowing. The bleeding has been from the right nare. She has tried nothing for the symptoms. Her past medical history is significant for colds. Her past medical history does not include bleeding disorder, sinus problems, allergies, nose-picking or frequent nosebleeds.    Past Medical History  Diagnosis Date  . Hyperlipidemia     x4 years   . Hypertension     whenshe wasa young but she changed diet and reducd salt intake   . Abnormal vaginal Pap smear     Dr Emelda Fear  . Allergic rhinitis   . Kidney stones     Past Surgical History  Procedure Date  . Cervical biopsy 2010    for abnormal pap   . Cyst removed from both breast , all benign both breast   . Breast reduction surgery Jan 2007    Family History  Problem Relation Age of Onset  . Fibromyalgia Mother   . Hypertension Mother   . Thyroid disease Mother   . Arthritis Mother   . Alcohol abuse Father     History  Substance Use Topics  . Smoking status: Former Smoker -- 0.2 packs/day for 20 years    Types: Cigarettes    Quit date: 06/05/2009  . Smokeless tobacco: Not on file  . Alcohol Use: Yes     occasionally, couple drinks per mo    OB History    Grav Para Term Preterm Abortions TAB SAB Ect Mult Living                  Review of Systems  HENT: Positive for nosebleeds, rhinorrhea and sneezing.   Respiratory: Positive for cough.   All other systems reviewed and are  negative.    Allergies  Review of patient's allergies indicates no known allergies.  Home Medications   Current Outpatient Rx  Name Route Sig Dispense Refill  . AZITHROMYCIN 250 MG PO TABS Oral Take 250 mg by mouth daily. Take two tablets on day 1, then take one tablet every day for 4 days     . OXYMETAZOLINE HCL 0.05 % NA SOLN Nasal Place 2 sprays into the nose daily.      Marland Kitchen SALINE NASAL SPRAY 0.65 % NA SOLN Nasal Place 1 spray into the nose as needed. For moisture     . AMITRIPTYLINE HCL 25 MG PO TABS Oral Take 1 tablet (25 mg total) by mouth at bedtime. 30 tablet 2  . ETONOGESTREL 68 MG [place] IMPL Subcutaneous Inject into the skin.      Marland Kitchen GUAIFENESIN-CODEINE 100-10 MG/5ML PO SYRP Oral Take 5 mLs by mouth 3 (three) times daily as needed for cough. 180 mL 0  . IBUPROFEN 800 MG PO TABS Oral Take 800 mg by mouth. Take one by mouth two times a day as needed for uncontrolled headache     . LORATADINE 10 MG PO TABS Oral Take 10 mg by mouth daily.      Marland Kitchen PRAVASTATIN SODIUM 40 MG  PO TABS Oral Take 1 tablet (40 mg total) by mouth every evening. 30 tablet 3  . RANITIDINE HCL 300 MG PO TABS Oral Take 1 tablet (300 mg total) by mouth at bedtime. 30 tablet 2  . VITAMIN D (ERGOCALCIFEROL) 50000 UNITS PO CAPS Oral Take 1 capsule (50,000 Units total) by mouth every 7 (seven) days. 4 capsule 5    BP 155/107  Pulse 81  Temp(Src) 98.2 F (36.8 C) (Oral)  Resp 19  Ht 5' 1.5" (1.562 m)  Wt 127 lb (57.607 kg)  BMI 23.61 kg/m2  SpO2 100%  Physical Exam  Nursing note and vitals reviewed. Constitutional: She is oriented to person, place, and time. She appears well-developed and well-nourished. No distress.  HENT:  Head: Normocephalic and atraumatic.  Nose: Mucosal edema present. No nose lacerations, sinus tenderness, nasal deformity, septal deviation or nasal septal hematoma. Epistaxis is observed.  No foreign bodies.       At exam time the nasal bleeding had resolved.  Pt has used 2 sprays of afrin in  each nostril  Eyes: EOM are normal.  Neck: Normal range of motion.  Cardiovascular: Normal rate, regular rhythm and normal heart sounds.   Pulmonary/Chest: Effort normal and breath sounds normal.  Abdominal: Soft. She exhibits no distension. There is no tenderness.  Musculoskeletal: Normal range of motion.  Neurological: She is alert and oriented to person, place, and time.  Skin: Skin is warm and dry.  Psychiatric: She has a normal mood and affect. Judgment normal.    ED Course  Procedures (including critical care time)  Labs Reviewed - No data to display No results found.   No diagnosis found.    MDM  Pt give bottle of afrin spray.  Told to use it q 12 hrs.  If nose bleeds again she is to pinch nostrils for at least 30 min.  If unable to control bleeding, return to the ED.        Worthy Rancher, PA 06/14/11 [phone]

## 2011-06-14 NOTE — ED Notes (Signed)
Pt left the er stating no needs 

## 2011-06-14 NOTE — ED Notes (Signed)
C/o nosebleed onset 10 min PTA

## 2011-06-14 NOTE — ED Notes (Signed)
No bleeding noted, pt resting in bed in room

## 2011-06-18 NOTE — ED Provider Notes (Signed)
Medical screening examination/treatment/procedure(s) were performed by non-physician practitioner and as supervising physician I was immediately available for consultation/collaboration.  Nicoletta Dress. Colon Branch, MD 06/18/11 [phone]

## 2011-08-03 ENCOUNTER — Encounter: Payer: Self-pay | Admitting: Family Medicine

## 2011-08-04 ENCOUNTER — Other Ambulatory Visit: Payer: Self-pay | Admitting: Family Medicine

## 2011-08-04 LAB — LIPID PANEL
Cholesterol: 217 mg/dL — ABNORMAL HIGH (ref 0–200)
HDL: 57 mg/dL (ref >39)
LDL Cholesterol: 146 mg/dL — ABNORMAL HIGH (ref 0–99)
Total CHOL/HDL Ratio: 3.8 Ratio
Triglycerides: 68 mg/dL (ref <150)
VLDL: 14 mg/dL (ref 0–40)

## 2011-08-04 LAB — HEPATIC FUNCTION PANEL
ALT: 9 U/L (ref 0–35)
AST: 16 U/L (ref 0–37)
Albumin: 3.9 g/dL (ref 3.5–5.2)
Alkaline Phosphatase: 59 U/L (ref 39–117)
Bilirubin, Direct: 0.1 mg/dL (ref 0.0–0.3)
Indirect Bilirubin: 0.3 mg/dL (ref 0.0–0.9)
Total Bilirubin: 0.4 mg/dL (ref 0.3–1.2)
Total Protein: 6.7 g/dL (ref 6.0–8.3)

## 2011-08-06 LAB — VITAMIN D 25 HYDROXY (VIT D DEFICIENCY, FRACTURES): Vit D, 25-Hydroxy: 25 ng/mL — ABNORMAL LOW (ref 30–89)

## 2011-08-10 ENCOUNTER — Ambulatory Visit (INDEPENDENT_AMBULATORY_CARE_PROVIDER_SITE_OTHER): Payer: Self-pay | Admitting: Family Medicine

## 2011-08-10 ENCOUNTER — Encounter: Payer: Self-pay | Admitting: Family Medicine

## 2011-08-10 VITALS — BP 104/78 | HR 111 | Resp 16 | Ht 61.0 in | Wt 131.0 lb

## 2011-08-10 DIAGNOSIS — E559 Vitamin D deficiency, unspecified: Secondary | ICD-10-CM

## 2011-08-10 DIAGNOSIS — J309 Allergic rhinitis, unspecified: Secondary | ICD-10-CM

## 2011-08-10 DIAGNOSIS — R5383 Other fatigue: Secondary | ICD-10-CM | POA: Insufficient documentation

## 2011-08-10 DIAGNOSIS — E785 Hyperlipidemia, unspecified: Secondary | ICD-10-CM

## 2011-08-10 DIAGNOSIS — R5381 Other malaise: Secondary | ICD-10-CM

## 2011-08-10 NOTE — Patient Instructions (Addendum)
F/u in 6.5 month  You need a sleep study., and you also  Need to start regular exercise program and consider commitment to volunteerism .  Take the cholesterol pill 3 nights per week till done , and continue the low fat diet, eat healthily, fruit and veg, drink a lot of water  Fasting lipid, tsh, chem 7 , cbc and vit d in 6 months  Start 1 mVI  Daily and also vit d 800iU one daily

## 2011-08-10 NOTE — Assessment & Plan Note (Signed)
Several month h/o excessive fatigue and daytime sleepiness, denies depression

## 2011-08-10 NOTE — Assessment & Plan Note (Signed)
Controlled, no change in medication  

## 2011-08-10 NOTE — Assessment & Plan Note (Signed)
Improved, pt has been non compliant with medication, will plan to take it approx 3 times per week, rept lab in 6 month

## 2011-08-10 NOTE — Progress Notes (Signed)
Subjective:     Patient ID: [name] [name], female   DOB: [birth year], 40 y.o.   MRN: [id number]  HPI ,The PT is here for follow up and re-evaluation of chronic medical conditions, medication management and review of any available recent lab and radiology data.  Preventive health is updated, specifically  Cancer screening and Immunization.   C/o fatigue and excessive daytime sleepiness all the time. Reports good 8 to 9 hour sleep habit, denies depression. Has no activity outside of work. Has been told that she snores a lot Also c/o intermittent left lower quadrant pain, review of radiologic data earlier this year shows left kidney stone    Review of Systems See HPI Denies recent fever or chills. Denies sinus pressure, nasal congestion, ear pain or sore throat. Denies chest congestion, productive cough or wheezing. Denies chest pains, palpitations and leg swelling Denies abdominal pain, nausea, vomiting,diarrhea or constipation.   Denies dysuria, frequency, hesitancy or incontinence. Denies joint pain, swelling and limitation in mobility. Denies headaches, seizures, numbness, or tingling. Denies depression, anxiety or insomnia. Denies skin break down or rash.        Objective:   Physical Exam Patient alert and oriented and in no cardiopulmonary distress.Excessive yawning noted during visit  HEENT: No facial asymmetry, EOMI, no sinus tenderness,  oropharynx pink and moist.  Neck supple no adenopathy.  Chest: Clear to auscultation bilaterally.  CVS: S1, S2 no murmurs, no S3.  ABD: Soft, left lower quadrant tenderness, no guarding or rebound, no mass. Bowel sounds normal.  Ext: No edema  MS: Adequate ROM spine, shoulders, hips and knees.  Skin: Intact, no ulcerations or rash noted.  Psych: Good eye contact, normal affect. Memory intact not anxious or depressed appearing.  CNS: CN 2-12 intact, power, tone and sensation normal throughout.     Assessment:           Plan:

## 2011-08-13 ENCOUNTER — Ambulatory Visit: Payer: Self-pay | Admitting: Family Medicine

## 2012-02-22 ENCOUNTER — Ambulatory Visit: Payer: Self-pay | Admitting: Family Medicine

## 2012-09-03 ENCOUNTER — Encounter: Payer: Self-pay | Admitting: Family Medicine

## 2012-09-22 ENCOUNTER — Ambulatory Visit (INDEPENDENT_AMBULATORY_CARE_PROVIDER_SITE_OTHER): Payer: PRIVATE HEALTH INSURANCE | Admitting: Family Medicine

## 2012-09-22 ENCOUNTER — Encounter: Payer: Self-pay | Admitting: Family Medicine

## 2012-09-22 VITALS — BP 108/72 | HR 88 | Resp 18 | Ht 61.0 in | Wt 138.0 lb

## 2012-09-22 DIAGNOSIS — Z1329 Encounter for screening for other suspected endocrine disorder: Secondary | ICD-10-CM

## 2012-09-22 DIAGNOSIS — K219 Gastro-esophageal reflux disease without esophagitis: Secondary | ICD-10-CM

## 2012-09-22 DIAGNOSIS — E559 Vitamin D deficiency, unspecified: Secondary | ICD-10-CM

## 2012-09-22 DIAGNOSIS — Z131 Encounter for screening for diabetes mellitus: Secondary | ICD-10-CM

## 2012-09-22 DIAGNOSIS — E785 Hyperlipidemia, unspecified: Secondary | ICD-10-CM

## 2012-09-22 DIAGNOSIS — J309 Allergic rhinitis, unspecified: Secondary | ICD-10-CM

## 2012-09-22 DIAGNOSIS — Z1211 Encounter for screening for malignant neoplasm of colon: Secondary | ICD-10-CM

## 2012-09-22 DIAGNOSIS — Z0000 Encounter for general adult medical examination without abnormal findings: Secondary | ICD-10-CM | POA: Insufficient documentation

## 2012-09-22 LAB — POC HEMOCCULT BLD/STL (OFFICE/1-CARD/DIAGNOSTIC): Fecal Occult Blood, POC: NEGATIVE

## 2012-09-22 MED ORDER — LORATADINE 10 MG PO TABS: 10.0000 mg | ORAL_TABLET | Freq: Every day | ORAL | Status: DC

## 2012-09-22 MED ORDER — PANTOPRAZOLE SODIUM 20 MG PO TBEC: 20.0000 mg | DELAYED_RELEASE_TABLET | Freq: Every day | ORAL | Status: DC

## 2012-09-22 MED ORDER — RANITIDINE HCL 300 MG PO TABS: 300.0000 mg | ORAL_TABLET | Freq: Every day | ORAL | Status: DC

## 2012-09-22 NOTE — Assessment & Plan Note (Signed)
Hyperlipidemia:Low fat diet discussed and encouraged.  Updated lab needed 

## 2012-09-22 NOTE — Progress Notes (Signed)
  Subjective:    Patient ID: [name] [name], female    DOB: 1970/12/21, 42 y.o.   MRN: [id number]  HPI The PT is here for annual exam  and re-evaluation of chronic medical conditions, medication management and review of any available recent lab and radiology data.  Preventive health is updated, specifically  Cancer screening and Immunization.   C/o uncontrolled allergies with clear nasal drainage, no cough fever , chills sinus pressure or ear pain. Wants tablets not nasal spray C/o reflux symptoms generally controlled with zantac needs a refill. C/o weight gain, intends to work on weight loss through exercise and reduced caloric intake   Review of Systems See HPI Denies recent fever or chills. Denies sinus pressure, , ear pain or sore throat. Denies chest congestion, productive cough or wheezing. Denies chest pains, palpitations and leg swelling Denies abdominal pain, nausea, vomiting,diarrhea or constipation.   Denies dysuria, frequency, hesitancy or incontinence. Denies joint pain, swelling and limitation in mobility. Denies headaches, seizures, numbness, or tingling. Denies depression, anxiety or insomnia. Denies skin break down or rash.        Objective:   Physical Exam  Pleasant well nourished female, alert and oriented x 3, in no cardio-pulmonary distress. Afebrile. HEENT No facial trauma or asymetry. Sinuses non tender.  EOMI, PERTL,  External ears normal, tympanic membranes clear. Oropharynx moist, no exudate, good dentition. Neck: supple, no adenopathy,JVD or thyromegaly.No bruits.  Chest: Clear to ascultation bilaterally.No crackles or wheezes. Non tender to palpation  Breast: No asymetry,no masses. No nipple discharge or inversion. No axillary or supraclavicular adenopathy. Bilateral well healed surgical scars from previous breast reduction surgery  Cardiovascular system; Heart sounds normal,  S1 and  S2 ,no S3.  No murmur, or thrill. Apical beat not  displaced Peripheral pulses normal.  Abdomen: Soft, non tender, no organomegaly or masses. No bruits. Bowel sounds normal. No guarding, tenderness or rebound.  Rectal:  No mass. Guaiac negative stool.  GU: External genitalia normal. No lesions. Vaginal canal normal.No discharge. Uterus normal size, no adnexal masses, no cervical motion or adnexal tenderness.  Musculoskeletal exam: Full ROM of spine, hips , shoulders and knees. No deformity ,swelling or crepitus noted. No muscle wasting or atrophy.   Neurologic: Cranial nerves 2 to 12 intact. Power, tone ,sensation and reflexes normal throughout. No disturbance in gait. No tremor.  Skin: Intact, no ulceration, erythema , scaling or rash noted. Pigmentation normal throughout  Psych; Normal mood and affect. Judgement and concentration normal       Assessment & Plan:

## 2012-09-22 NOTE — Patient Instructions (Addendum)
F/u in 20month, please call if you need me before  You will be notified of pap result.  Mammogram past due please schedule.  Fasting labs as soon as possible, HBa1c, TSH, lipid, cbc, vit D Script will be given to you for generic claritin(loratidine). OK to use sudafed one daily for excessive drainage and pressure, this is OTC  Med sent to your pharmacy for reflux  Weight loss goal of 12 to 15 pounds in the next 7 month

## 2012-09-22 NOTE — Assessment & Plan Note (Signed)
Adequately controlled on zantac, same refilled

## 2012-09-22 NOTE — Assessment & Plan Note (Signed)
Increased and uncontrolled symptoms, claritin prescribed 

## 2012-09-22 NOTE — Assessment & Plan Note (Signed)
Annual exam completed as documented. Pt needs mammogram this is past due, she will schedule. Fasting labs as soon as possible. Commitment to regular physical activity , reduced caloric intake and weight loss is stressed

## 2012-10-27 ENCOUNTER — Telehealth: Payer: Self-pay | Admitting: Family Medicine

## 2012-10-28 ENCOUNTER — Other Ambulatory Visit: Payer: Self-pay | Admitting: Family Medicine

## 2012-10-28 MED ORDER — AZITHROMYCIN 250 MG PO TABS: ORAL_TABLET | ORAL | Status: AC

## 2012-10-28 NOTE — Telephone Encounter (Signed)
Let pt know z pack sent to her pharmacy, if symptoms do not improve sghe needs to call and come in to be seen, Dr Jeanice Lim may be available for work ins

## 2012-11-04 NOTE — Telephone Encounter (Signed)
Patient states she has been busy but is going to get the med today. Will call back if no better

## 2012-11-05 ENCOUNTER — Encounter: Payer: Self-pay | Admitting: Family Medicine

## 2013-02-02 ENCOUNTER — Other Ambulatory Visit (HOSPITAL_COMMUNITY): Payer: Self-pay | Admitting: Urology

## 2013-02-02 DIAGNOSIS — N23 Unspecified renal colic: Secondary | ICD-10-CM

## 2013-02-05 ENCOUNTER — Ambulatory Visit (HOSPITAL_COMMUNITY): Payer: PRIVATE HEALTH INSURANCE

## 2013-04-15 ENCOUNTER — Ambulatory Visit: Payer: PRIVATE HEALTH INSURANCE | Admitting: Family Medicine

## 2013-04-16 ENCOUNTER — Ambulatory Visit: Payer: PRIVATE HEALTH INSURANCE | Admitting: Family Medicine

## 2013-06-10 ENCOUNTER — Encounter: Payer: Self-pay | Admitting: Women's Health

## 2013-06-10 ENCOUNTER — Encounter (INDEPENDENT_AMBULATORY_CARE_PROVIDER_SITE_OTHER): Payer: Self-pay

## 2013-06-10 ENCOUNTER — Ambulatory Visit (INDEPENDENT_AMBULATORY_CARE_PROVIDER_SITE_OTHER): Payer: PRIVATE HEALTH INSURANCE | Admitting: Women's Health

## 2013-06-10 VITALS — BP 120/90 | Ht 61.0 in | Wt 135.0 lb

## 2013-06-10 DIAGNOSIS — R102 Pelvic and perineal pain unspecified side: Secondary | ICD-10-CM

## 2013-06-10 DIAGNOSIS — Z3046 Encounter for surveillance of implantable subdermal contraceptive: Secondary | ICD-10-CM

## 2013-06-10 DIAGNOSIS — Z30017 Encounter for initial prescription of implantable subdermal contraceptive: Secondary | ICD-10-CM

## 2013-06-10 NOTE — Patient Instructions (Signed)
Keep the area clean and dry.  You can remove the big bandage in 24 hours, and the small steri-strip bandage in 3-5 days.  A back up method, such as condoms, should be used for two weeks. You may have irregular vaginal bleeding for the first 6 months after the Nexplanon is placed, then the bleeding usually lightens and it is possible that you may not have any periods.  If you have any concerns, please give [place] a call.    Etonogestrel implant What is this medicine? ETONOGESTREL is a contraceptive (birth control) device. It is used to prevent pregnancy. It can be used for up to 3 years. This medicine may be used for other purposes; ask your health care provider or pharmacist if you have questions. What should I tell my health care provider before I take this medicine? They need to know if you have any of these conditions: -abnormal vaginal bleeding -blood vessel disease or blood clots -cancer of the breast, cervix, or liver -depression -diabetes -gallbladder disease -headaches -heart disease or recent heart attack -high blood pressure -high cholesterol -kidney disease -liver disease -renal disease -seizures -tobacco smoker -an unusual or allergic reaction to etonogestrel, other hormones, anesthetics or antiseptics, medicines, foods, dyes, or preservatives -pregnant or trying to get pregnant -breast-feeding How should I use this medicine? This device is inserted just under the skin on the inner side of your upper arm by a health care professional. Talk to your pediatrician regarding the use of this medicine in children. Special care may be needed. Overdosage: If you think you've taken too much of this medicine contact a poison control center or emergency room at once. Overdosage: If you think you have taken too much of this medicine contact a poison control center or emergency room at once. NOTE: This medicine is only for you. Do not share this medicine with others. What if I miss a  dose? This does not apply. What may interact with this medicine? Do not take this medicine with any of the following medications: -amprenavir -bosentan -fosamprenavir This medicine may also interact with the following medications: -barbiturate medicines for inducing sleep or treating seizures -certain medicines for fungal infections like ketoconazole and itraconazole -griseofulvin -medicines to treat seizures like carbamazepine, felbamate, oxcarbazepine, phenytoin, topiramate -modafinil -phenylbutazone -rifampin -some medicines to treat HIV infection like atazanavir, indinavir, lopinavir, nelfinavir, tipranavir, ritonavir -St. John's wort This list may not describe all possible interactions. Give your health care provider a list of all the medicines, herbs, non-prescription drugs, or dietary supplements you use. Also tell them if you smoke, drink alcohol, or use illegal drugs. Some items may interact with your medicine. What should I watch for while using this medicine? This product does not protect you against HIV infection (AIDS) or other sexually transmitted diseases. You should be able to feel the implant by pressing your fingertips over the skin where it was inserted. Tell your doctor if you cannot feel the implant. What side effects may I notice from receiving this medicine? Side effects that you should report to your doctor or health care professional as soon as possible: -allergic reactions like skin rash, itching or hives, swelling of the face, lips, or tongue -breast lumps -changes in vision -confusion, trouble speaking or understanding -dark urine -depressed mood -general ill feeling or flu-like symptoms -light-colored stools -loss of appetite, nausea -right upper belly pain -severe headaches -severe pain, swelling, or tenderness in the abdomen -shortness of breath, chest pain, swelling in a leg -signs of pregnancy -  sudden numbness or weakness of the face, arm or  leg -trouble walking, dizziness, loss of balance or coordination -unusual vaginal bleeding, discharge -unusually weak or tired -yellowing of the eyes or skin Side effects that usually do not require medical attention (Report these to your doctor or health care professional if they continue or are bothersome.): -acne -breast pain -changes in weight -cough -fever or chills -headache -irregular menstrual bleeding -itching, burning, and vaginal discharge -pain or difficulty passing urine -sore throat This list may not describe all possible side effects. Call your doctor for medical advice about side effects. You may report side effects to FDA at 1-800-FDA-1088. Where should I keep my medicine? This drug is given in a hospital or clinic and will not be stored at home. NOTE: This sheet is a summary. It may not cover all possible information. If you have questions about this medicine, talk to your doctor, pharmacist, or health care provider.  2013, [employer]. (05/06/2009 3:54:17 PM)

## 2013-06-10 NOTE — Progress Notes (Signed)
Patient ID: [name] [name], female   DOB: 1970-10-25, 42 y.o.   MRN: [id number]  [name] [name] is a 42 y.o. year old African American female here for Nexplanon removal and insertion of new Nexplanon. Risks/benefits/side effects of Nexplanon have been discussed and her questions have been answered.  Specifically, a failure rate of 08/998 has been reported, with an increased failure rate if pt takes St. John's Wort and/or antiseizure medicaitons.  [name] [name] is aware of the common side effect of irregular bleeding, which the incidence of decreases over time.Her LMP 05/25/13.  Present Nexplanon was placed Nov 2011 at [facility], she is still w/in her 21yr time frame. She uses condoms for STI prevention. She reports occ pain in left lower pelvis. Has light periods ~q53mths on nexplanon, but last period was a bit heavier. Reports family h/o fibroids, wants to know if she has them. Doesn't want to have pelvic to evaluate irregular menses/LLQ pain at this time. Gets pap/physical at Dr. Anthony Sar office, but wants to come here in January for pap/physical/labs, evaluation of LLQ pain. Last pap was neg w/ +HRHPV. Discussed option of getting pelvic u/s now, which she would like to do.   Patient given informed consent for removal of her Nexplanon, time out was performed. Nexplanon site identified in Lt arm.  Area prepped in usual sterile fashon. Two cc's of 1% lidocaine was used to anesthetize the area specifically at the distal end of the implant, but also along the entire length as a new one will be placed.  A small stab incision was made right beside the implant on the distal portion.  I was unable to visualized the tip through the incision, so JGriffin, NP assisted and extended the incision site, and then the nexplanon rod was grasped using hemostats and removed without difficulty by her.  There was less than 3 cc blood loss.    The area was cleansed again with betadine and the Nexplanon was inserted per  manufacturer's recommendations without difficulty.  Steri-strips and a pressure bandage was applied. There were no complications, and pt tolerated procedure well.  Pt was instructed to keep the area clean and dry, remove pressure bandage in 24 hours, and keep insertion site covered with the steri-strip for 3-5 days.  To continue using condoms for STI prevention.  Follow-up 1wk for pelvic u/s d/t irregular menses and left lower pelvic pain. Due for pap/physical in January 2015.    [name] [name] 06/10/2013 12:20 PM

## 2013-06-30 ENCOUNTER — Other Ambulatory Visit: Payer: PRIVATE HEALTH INSURANCE

## 2013-06-30 ENCOUNTER — Ambulatory Visit: Payer: PRIVATE HEALTH INSURANCE | Admitting: Women's Health

## 2013-07-31 ENCOUNTER — Encounter: Payer: Self-pay | Admitting: Family Medicine

## 2013-07-31 ENCOUNTER — Ambulatory Visit (INDEPENDENT_AMBULATORY_CARE_PROVIDER_SITE_OTHER): Payer: PRIVATE HEALTH INSURANCE | Admitting: Family Medicine

## 2013-07-31 ENCOUNTER — Encounter (INDEPENDENT_AMBULATORY_CARE_PROVIDER_SITE_OTHER): Payer: Self-pay

## 2013-07-31 VITALS — BP 118/84 | HR 98 | Resp 16 | Ht 61.0 in | Wt 134.0 lb

## 2013-07-31 DIAGNOSIS — F339 Major depressive disorder, recurrent, unspecified: Secondary | ICD-10-CM

## 2013-07-31 DIAGNOSIS — G4700 Insomnia, unspecified: Secondary | ICD-10-CM | POA: Insufficient documentation

## 2013-07-31 DIAGNOSIS — R51 Headache: Secondary | ICD-10-CM

## 2013-07-31 DIAGNOSIS — F329 Major depressive disorder, single episode, unspecified: Secondary | ICD-10-CM

## 2013-07-31 HISTORY — DX: Major depressive disorder, recurrent, unspecified: F33.9

## 2013-07-31 MED ORDER — TEMAZEPAM 7.5 MG PO CAPS: 7.5000 mg | ORAL_CAPSULE | Freq: Every evening | ORAL | Status: DC | PRN

## 2013-07-31 MED ORDER — KETOROLAC TROMETHAMINE 60 MG/2ML IM SOLN
60.0000 mg | Freq: Once | INTRAMUSCULAR | Status: AC
  Administered 2013-07-31: 60 mg via INTRAMUSCULAR

## 2013-07-31 MED ORDER — FLUOXETINE HCL 10 MG PO CAPS
10.0000 mg | ORAL_CAPSULE | Freq: Every day | ORAL | Status: DC
Start: 2013-07-31 — End: 2013-11-16

## 2013-07-31 NOTE — Addendum Note (Signed)
Addended by: Abner Greenspan on: 07/31/2013 10:05 AM   Modules accepted: Orders

## 2013-07-31 NOTE — Progress Notes (Signed)
   Subjective:    Patient ID: [name] [name], female    DOB: 05/26/1971, 42 y.o.   MRN: [id number]  HPI Pt in with c/o feeling overwhelmed and stressed. She has a lot of anger kept inside after unexpectedly losing her father under non medical an possibly criminal circumstances per her report. She denies being suicidal or homicidal, but called in yesterday to be seen as she felt as though she was going to have a nervous break down and "lose it" while at work, states she feels as if she could punch out the walls, she is so angry and hurt, again specifically denies any intent to harm anyone including herself. Has problems with her work environment, states only got 2 days to grieve though she has a lot of PAL time available, also has recently been started on the night shift, has a chronic problem of poor sleep which has worsened with her depression, sleeping on avg 2 to 3 hours . Focus and ability to carry out her job currently is severely compromised , so she needs time out of work. C/o headache , inability to focus and comprehend , sleep deprived with thoughts elsewhere, no h/o fever or chills or respiratory symptoms or neck stifness    Review of Systems See HPI Denies recent fever or chills. Denies sinus pressure, nasal congestion, ear pain or sore throat. Denies chest congestion, productive cough or wheezing. Denies chest pains, palpitations and leg swelling Denies skin break down or rash.        Objective:   Physical Exam  Patient alert and oriented and in no cardiopulmonary distress.Tearful  HEENT: No facial asymmetry, EOMI, n.  Neck supple no adenopathy.  Chest: Clear to auscultation bilaterally.  CVS: S1, S2 no murmurs, no S3.   Ext: No edema  MS: Adequate ROM spine, shoulders, hips and knees.  Skin: Intact, no ulcerations or rash noted.  Psych: Good eye contact, tearful affect. Memory intact anxious andr depressed appearing.  CNS: CN 2-12 intact, power, normal  throughout.       Assessment & Plan:

## 2013-07-31 NOTE — Assessment & Plan Note (Signed)
Uncontrolled, due to lack of sleep, no focal deficits , toradol 60mg  IM in the office for headache

## 2013-07-31 NOTE — Patient Instructions (Signed)
F/u end January  New for depresion is fluoxetine 1 daily, and for sleep is restoril at bedtime  You are referred for therapy  Work excuse from today to return 08/10/2013

## 2013-08-03 ENCOUNTER — Telehealth: Payer: Self-pay | Admitting: Family Medicine

## 2013-08-03 ENCOUNTER — Ambulatory Visit (INDEPENDENT_AMBULATORY_CARE_PROVIDER_SITE_OTHER): Payer: PRIVATE HEALTH INSURANCE | Admitting: Psychiatry

## 2013-08-03 DIAGNOSIS — F4323 Adjustment disorder with mixed anxiety and depressed mood: Secondary | ICD-10-CM

## 2013-08-03 NOTE — Telephone Encounter (Signed)
Thanks
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
## 2013-08-04 NOTE — Patient Instructions (Signed)
Discussed orally 

## 2013-08-13 ENCOUNTER — Telehealth: Payer: Self-pay

## 2013-08-13 MED ORDER — TEMAZEPAM 15 MG PO CAPS: 15.0000 mg | ORAL_CAPSULE | Freq: Every evening | ORAL | Status: DC | PRN

## 2013-08-13 MED ORDER — FLUOXETINE HCL (PMDD) 20 MG PO CAPS: 1.0000 | ORAL_CAPSULE | Freq: Every day | ORAL | Status: DC

## 2013-08-13 NOTE — Telephone Encounter (Signed)
Pt states she is upset with her mother, still crying a lot and  Needs double dose of restoril, Increase uin both fluoxetine and restopril as of today. Please complete and enter a PHQ 9  For today. She needs a f/u visit scheduled for end [facility]  She needs to collect FMLA from front desk

## 2013-08-13 NOTE — Telephone Encounter (Signed)
Appt scheduled for end of Jan and new PHQ-9 score is 15. Patient given rx's in hand as well as her FMLA form

## 2013-08-18 ENCOUNTER — Ambulatory Visit (HOSPITAL_COMMUNITY): Payer: PRIVATE HEALTH INSURANCE | Admitting: Psychology

## 2013-08-18 ENCOUNTER — Ambulatory Visit (INDEPENDENT_AMBULATORY_CARE_PROVIDER_SITE_OTHER): Payer: PRIVATE HEALTH INSURANCE | Admitting: Psychiatry

## 2013-08-18 DIAGNOSIS — F4323 Adjustment disorder with mixed anxiety and depressed mood: Secondary | ICD-10-CM
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
## 2013-08-18 NOTE — Patient Instructions (Signed)
Discussed orally 

## 2013-08-24 ENCOUNTER — Telehealth: Payer: Self-pay

## 2013-08-24 NOTE — Telephone Encounter (Signed)
Spoke with patient she is to leave the form in the morning and this will be completed accurately, I have apologized to the patient, she accepts the apology  Pls hand  This to me or leave  On my desk so it can be done tomorrow

## 2013-08-26 ENCOUNTER — Other Ambulatory Visit: Payer: PRIVATE HEALTH INSURANCE

## 2013-08-26 ENCOUNTER — Encounter: Payer: Self-pay | Admitting: Obstetrics & Gynecology

## 2013-08-26 ENCOUNTER — Ambulatory Visit (INDEPENDENT_AMBULATORY_CARE_PROVIDER_SITE_OTHER): Payer: PRIVATE HEALTH INSURANCE | Admitting: Obstetrics & Gynecology

## 2013-08-26 VITALS — BP 120/80 | Ht 61.0 in | Wt 136.0 lb

## 2013-08-26 DIAGNOSIS — N949 Unspecified condition associated with female genital organs and menstrual cycle: Secondary | ICD-10-CM

## 2013-08-26 DIAGNOSIS — R1032 Left lower quadrant pain: Secondary | ICD-10-CM

## 2013-08-26 MED ORDER — MEGESTROL ACETATE 40 MG PO TABS: ORAL_TABLET | ORAL | Status: DC

## 2013-08-26 NOTE — Telephone Encounter (Signed)
Patient picked up FMLA papers today

## 2013-08-26 NOTE — Progress Notes (Signed)
Patient ID: [name] [name], female   DOB: November 27, 1970, 42 y.o.   MRN: [id number] Blood pressure 120/80, height 5\' 1"  (1.549 m), weight 136 lb (61.689 kg), last menstrual period 08/05/2013.  Pt with 20 days of bleeding  Variable, uses panty liners, has clots Hurts on the left side, constantly not necessarily related to her bleeding No other associated symtoms  Exam No CMT Uterus retroverted, non tender Left adnexa is quite tender and feels mass, Fibroid vs ovarian cyst, favor fibroid due to bleeding  Megace Keep scheduled appt January 6th sonogram + appt with me

## 2013-08-28 ENCOUNTER — Other Ambulatory Visit: Payer: Self-pay | Admitting: Women's Health

## 2013-08-28 DIAGNOSIS — N938 Other specified abnormal uterine and vaginal bleeding: Secondary | ICD-10-CM

## 2013-08-28 DIAGNOSIS — N949 Unspecified condition associated with female genital organs and menstrual cycle: Secondary | ICD-10-CM

## 2013-09-01 ENCOUNTER — Encounter: Payer: Self-pay | Admitting: Obstetrics & Gynecology

## 2013-09-01 ENCOUNTER — Ambulatory Visit (INDEPENDENT_AMBULATORY_CARE_PROVIDER_SITE_OTHER): Payer: PRIVATE HEALTH INSURANCE

## 2013-09-01 ENCOUNTER — Encounter (INDEPENDENT_AMBULATORY_CARE_PROVIDER_SITE_OTHER): Payer: Self-pay

## 2013-09-01 ENCOUNTER — Ambulatory Visit (INDEPENDENT_AMBULATORY_CARE_PROVIDER_SITE_OTHER): Payer: PRIVATE HEALTH INSURANCE | Admitting: Obstetrics & Gynecology

## 2013-09-01 ENCOUNTER — Other Ambulatory Visit: Payer: Self-pay | Admitting: Women's Health

## 2013-09-01 VITALS — BP 120/80 | Ht 61.0 in | Wt 134.0 lb

## 2013-09-01 DIAGNOSIS — N949 Unspecified condition associated with female genital organs and menstrual cycle: Secondary | ICD-10-CM

## 2013-09-01 DIAGNOSIS — D259 Leiomyoma of uterus, unspecified: Secondary | ICD-10-CM

## 2013-09-01 DIAGNOSIS — N925 Other specified irregular menstruation: Secondary | ICD-10-CM

## 2013-09-01 DIAGNOSIS — N938 Other specified abnormal uterine and vaginal bleeding: Secondary | ICD-10-CM

## 2013-09-01 DIAGNOSIS — N920 Excessive and frequent menstruation with regular cycle: Secondary | ICD-10-CM

## 2013-09-01 NOTE — Progress Notes (Signed)
Patient ID: [name] [name], female   DOB: Jan 17, 1971, 43 y.o.   MRN: [id number] Sonogram reviewed report done Area on exam was a fibroid  Will maintain her megace for now, stop around April 1 and we will see what happens  Follow up prn or 6 months  Past Medical History  Diagnosis Date  . Hyperlipidemia     x4 years   . Abnormal vaginal Pap smear     Dr Glo Herring  . Allergic rhinitis   . Kidney stones   . Hypertension     whenshe wasa young but she changed diet and reducd salt intake     Past Surgical History  Procedure Laterality Date  . Cervical biopsy  2010    for abnormal pap   . Cyst removed from both breast , all benign both breast    . Breast reduction surgery  Jan 2007    OB History   Grav Para Term Preterm Abortions TAB SAB Ect Mult Living   2    2 2           No Known Allergies  History   Social History  . Marital Status: Single    Spouse Name: N/A    Number of Children: 0  . Years of Education: N/A   Occupational History  . student      nursing school  . 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 Smoking status: Former Smoker -- 0.20 packs/day for 20 years    Types: Cigarettes    Quit date: 06/05/2009  . Smokeless tobacco: Never Used  . Alcohol Use: Yes     Comment: occasionally, couple drinks per mo  . Drug Use: No  . Sexual Activity: Yes    Birth Control/ Protection: Implant   Other Topics Concern  . None   Social History Narrative  . None    Family History  Problem Relation Age of Onset  . Fibromyalgia Mother   . Hypertension Mother   . Thyroid disease Mother   . Arthritis Mother   . Alcohol abuse Father

## 2013-09-02 ENCOUNTER — Telehealth: Payer: Self-pay | Admitting: *Deleted

## 2013-09-02 MED ORDER — KETOROLAC TROMETHAMINE 10 MG PO TABS: 10.0000 mg | ORAL_TABLET | Freq: Three times a day (TID) | ORAL | Status: DC | PRN

## 2013-09-02 NOTE — Telephone Encounter (Signed)
C/o abdominal pain on left side. Pt states saw Dr. Elonda Husky yesterday and has uterine fibroids. Requesting pain meds.

## 2013-09-02 NOTE — Telephone Encounter (Signed)
Spoke with pt letting her know Toradol was e prescribed. Advised constipation is a common side effect with any pain med. Pt voiced understanding. [name] [name]

## 2013-09-03 ENCOUNTER — Ambulatory Visit: Payer: PRIVATE HEALTH INSURANCE | Admitting: Obstetrics & Gynecology

## 2013-09-03 ENCOUNTER — Other Ambulatory Visit: Payer: PRIVATE HEALTH INSURANCE

## 2013-09-07 ENCOUNTER — Telehealth: Payer: Self-pay | Admitting: *Deleted

## 2013-09-07 NOTE — Telephone Encounter (Signed)
Spoke with pt. Toradol is on back order at [facility] in [place]. Pt wanted med called to [facility] in [place]. Advised to call [facility] and see if they have the med and if so, have them transfer Rx to their pharmacy. Pt voiced understanding. [employer]

## 2013-09-09 ENCOUNTER — Ambulatory Visit (HOSPITAL_COMMUNITY): Payer: Self-pay | Admitting: Psychiatry

## 2013-09-10 ENCOUNTER — Ambulatory Visit (HOSPITAL_COMMUNITY): Payer: Self-pay | Admitting: Psychiatry

## 2013-09-15 ENCOUNTER — Ambulatory Visit (HOSPITAL_COMMUNITY): Payer: Self-pay | Admitting: Psychiatry

## 2013-09-18 ENCOUNTER — Ambulatory Visit (HOSPITAL_COMMUNITY): Payer: Self-pay | Admitting: Psychiatry

## 2013-09-23 ENCOUNTER — Ambulatory Visit: Payer: PRIVATE HEALTH INSURANCE | Admitting: Family Medicine

## 2013-09-28 ENCOUNTER — Telehealth: Payer: Self-pay | Admitting: *Deleted

## 2013-09-28 ENCOUNTER — Encounter: Payer: PRIVATE HEALTH INSURANCE | Admitting: Family Medicine

## 2013-09-28 NOTE — Telephone Encounter (Signed)
Pt states has taken Megace 40 mg as Dr. Elonda Husky prescribe is now on 1 tablet daily, but started light vaginal bleeding again this past Friday and has continued to bleed until today. Please advise.

## 2013-09-28 NOTE — Telephone Encounter (Signed)
Pt informed per Dr. Elonda Husky to take 2 tablet of megace for 1 week then go back to 1 tablet. Pt informed to call office back if this does not resolve vaginal bleeding. Pt verbalized understanding.

## 2013-09-28 NOTE — Telephone Encounter (Signed)
Bump back up to two tablets a day and stay on that for a week then try to come down to 1 tablet again

## 2013-10-28 ENCOUNTER — Ambulatory Visit (INDEPENDENT_AMBULATORY_CARE_PROVIDER_SITE_OTHER): Payer: PRIVATE HEALTH INSURANCE | Admitting: Adult Health

## 2013-10-28 ENCOUNTER — Encounter: Payer: Self-pay | Admitting: Adult Health

## 2013-10-28 VITALS — BP 120/84 | Ht 61.0 in | Wt 135.0 lb

## 2013-10-28 DIAGNOSIS — D259 Leiomyoma of uterus, unspecified: Secondary | ICD-10-CM

## 2013-10-28 DIAGNOSIS — D219 Benign neoplasm of connective and other soft tissue, unspecified: Secondary | ICD-10-CM

## 2013-10-28 DIAGNOSIS — N921 Excessive and frequent menstruation with irregular cycle: Secondary | ICD-10-CM

## 2013-10-28 DIAGNOSIS — N949 Unspecified condition associated with female genital organs and menstrual cycle: Secondary | ICD-10-CM

## 2013-10-28 DIAGNOSIS — D250 Submucous leiomyoma of uterus: Secondary | ICD-10-CM | POA: Insufficient documentation

## 2013-10-28 HISTORY — DX: Excessive and frequent menstruation with irregular cycle: N92.1

## 2013-10-28 HISTORY — DX: Benign neoplasm of connective and other soft tissue, unspecified: D21.9

## 2013-10-28 HISTORY — DX: Unspecified condition associated with female genital organs and menstrual cycle: N94.9

## 2013-10-28 LAB — CBC
HCT: 41.9 % (ref 36.0–46.0)
Hemoglobin: 14.2 g/dL (ref 12.0–15.0)
MCH: 29.1 pg (ref 26.0–34.0)
MCHC: 33.9 g/dL (ref 30.0–36.0)
MCV: 85.9 fL (ref 78.0–100.0)
Platelets: 408 10*3/uL — ABNORMAL HIGH (ref 150–400)
RBC: 4.88 MIL/uL (ref 3.87–5.11)
RDW: 14.6 % (ref 11.5–15.5)
WBC: 8.4 10*3/uL (ref 4.0–10.5)

## 2013-10-28 LAB — TSH: TSH: 1.932 u[IU]/mL (ref 0.350–4.500)

## 2013-10-28 LAB — HIV ANTIBODY (ROUTINE TESTING W REFLEX): HIV: NONREACTIVE

## 2013-10-28 MED ORDER — DOXYCYCLINE HYCLATE 100 MG PO CAPS: 100.0000 mg | ORAL_CAPSULE | Freq: Two times a day (BID) | ORAL | Status: DC

## 2013-10-28 NOTE — Patient Instructions (Signed)
Dysfunctional Uterine Bleeding Normally, menstrual periods begin between ages 11 to 17 in young women. A normal menstrual cycle/period may begin every 23 days up to 35 days and lasts from 1 to 7 days. Around 12 to 14 days before your menstrual period starts, ovulation (ovary produces an egg) occurs. When counting the time between menstrual periods, count from the first day of bleeding of the previous period to the first day of bleeding of the next period. Dysfunctional (abnormal) uterine bleeding is bleeding that is different from a normal menstrual period. Your periods may come earlier or later than usual. They may be lighter, have blood clots or be heavier. You may have bleeding between periods, or you may skip one period or more. You may have bleeding after sexual intercourse, bleeding after menopause, or no menstrual period. CAUSES   Pregnancy (normal, miscarriage, tubal).  IUDs (intrauterine device, birth control).  Birth control pills.  Hormone treatment.  Menopause.  Infection of the cervix.  Blood clotting problems.  Infection of the inside lining of the uterus.  Endometriosis, inside lining of the uterus growing in the pelvis and other female organs.  Adhesions (scar tissue) inside the uterus.  Obesity or severe weight loss.  Uterine polyps inside the uterus.  Cancer of the vagina, cervix, or uterus.  Ovarian cysts or polycystic ovary syndrome.  Medical problems (diabetes, thyroid disease).  Uterine fibroids (noncancerous tumor).  Problems with your female hormones.  Endometrial hyperplasia, very thick lining and enlarged cells inside of the uterus.  Medicines that interfere with ovulation.  Radiation to the pelvis or abdomen.  Chemotherapy. DIAGNOSIS   Your doctor will discuss the history of your menstrual periods, medicines you are taking, changes in your weight, stress in your life, and any medical problems you may have.  Your doctor will do a physical  and pelvic examination.  Your doctor may want to perform certain tests to make a diagnosis, such as:  Pap test.  Blood tests.  Cultures for infection.  CT scan.  Ultrasound.  Hysteroscopy.  Laparoscopy.  MRI.  Hysterosalpingography.  D and C.  Endometrial biopsy. TREATMENT  Treatment will depend on the cause of the dysfunctional uterine bleeding (DUB). Treatment may include:  Observing your menstrual periods for a couple of months.  Prescribing medicines for medical problems, including:  Antibiotics.  Hormones.  Birth control pills.  Removing an IUD (intrauterine device, birth control).  Surgery:  D and C (scrape and remove tissue from inside the uterus).  Laparoscopy (examine inside the abdomen with a lighted tube).  Uterine ablation (destroy lining of the uterus with electrical current, laser, heat, or freezing).  Hysteroscopy (examine cervix and uterus with a lighted tube).  Hysterectomy (remove the uterus). HOME CARE INSTRUCTIONS   If medicines were prescribed, take exactly as directed. Do not change or switch medicines without consulting your caregiver.  Long term heavy bleeding may result in iron deficiency. Your caregiver may have prescribed iron pills. They help replace the iron that your body lost from heavy bleeding. Take exactly as directed.  Do not take aspirin or medicines that contain aspirin one week before or during your menstrual period. Aspirin may make the bleeding worse.  If you need to change your sanitary pad or tampon more than once every 2 hours, stay in bed with your feet elevated and a cold pack on your lower abdomen. Rest as much as possible, until the bleeding stops or slows down.  Eat well-balanced meals. Eat foods high in iron. Examples   are:  Leafy green vegetables.  Whole-grain breads and cereals.  Eggs.  Meat.  Liver.  Do not try to lose weight until the abnormal bleeding has stopped and your blood iron level is  back to normal. Do not lift more than ten pounds or do strenuous activities when you are bleeding.  For a couple of months, make note on your calendar, marking the start and ending of your period, and the type of bleeding (light, medium, heavy, spotting, clots or missed periods). This is for your caregiver to better evaluate your problem. SEEK MEDICAL CARE IF:   You develop nausea (feeling sick to your stomach) and vomiting, dizziness, or diarrhea while you are taking your medicine.  You are getting lightheaded or weak.  You have any problems that may be related to the medicine you are taking.  You develop pain with your DUB.  You want to remove your IUD.  You want to stop or change your birth control pills or hormones.  You have any type of abnormal bleeding mentioned above.  You are over 71 years old and have not had a menstrual period yet.  You are 43 years old and you are still having menstrual periods.  You have any of the symptoms mentioned above.  You develop a rash. SEEK IMMEDIATE MEDICAL CARE IF:   An oral temperature above 102 F (38.9 C) develops.  You develop chills.  You are changing your sanitary pad or tampon more than once an hour.  You develop abdominal pain.  You pass out or faint. Document Released: 08/10/2000 Document Revised: 11/05/2011 Document Reviewed: 07/12/2009 [facility] Patient Information 2014 [place]. Take doxy Return in 2 weeks to talk Laparoscopic Tubal Ligation Laparoscopic tubal ligation is a procedure that closes the fallopian tubes at a time other than right after childbirth. By closing the fallopian tubes, the eggs that are released from the ovaries cannot enter the uterus and sperm cannot reach the egg. Tubal ligation is also known as getting your "tubes tied." Tubal ligation is done so you will not be able to get pregnant or have a baby.  Although this procedure may be reversed, it should be considered permanent and  irreversible. If you want to have future pregnancies, you should not have this procedure.  LET YOUR CAREGIVER KNOW ABOUT: Allergies to food or medicine. Medicines taken, including vitamins, herbs, eyedrops, over-the-counter medicines, and creams. Use of steroids (by mouth or creams). Previous problems with numbing medicines. History of bleeding problems or blood clots. Any recent colds or infections. Previous surgery. Other health problems, including diabetes and kidney problems. Possibility of pregnancy, if this applies. Any past pregnancies. RISKS AND COMPLICATIONS  Infection. Bleeding. Injury to surrounding organs. Anesthetic side effects. Failure of the procedure. Ectopic pregnancy. Future regret about having the procedure done. BEFORE THE PROCEDURE Do not take aspirin or blood thinners a week before the procedure or as directed. This can cause bleeding. Do not eat or drink anything 6 to 8 hours before the procedure. PROCEDURE  You may be given a medicine to help you relax (sedative) before the procedure. You will be given a medicine to make you sleep (general anesthetic) during the procedure. A tube will be put down your throat to help your breath while under general anesthesia. Two small cuts (incisions) are made in the lower abdominal area and near the belly button. Your abdominal area will be inflated with a safe gas (carbon dioxide). This helps give the surgeon room to operate, visualize, and  helps the surgeon avoid other organs. A thin, lighted tube (laparoscope) with a camera attached is inserted into your abdomen through one of the incisions near the belly button. Other small instruments are also inserted through the other abdominal incision. The fallopian tubes are located and are either blocked with a ring, clip, or are burned (cauterized). After the fallopian tubes are blocked, the gas is released from the abdomen. The incisions will be closed with stitches (sutures),  and a bandage may be placed over the incisions. AFTER THE PROCEDURE  You will rest in a recovery room for 1 4 hours until you are stable and doing well. You will also have some mild abdominal discomfort for 3 7 days. You will be given pain medicine to ease any discomfort. As long as there are no problems, you may be allowed to go home. Someone will need to drive you home and be with you for at least 24 hours once home. You may have some mild discomfort in the throat. This is from the tube placed in your throat while you were sleeping. You may experience discomfort in the shoulder area from some trapped air between the liver and diaphragm. This sensation is normal and will slowly go away on its own. Document Released: 11/19/2000 Document Revised: 02/12/2012 Document Reviewed: 11/24/2011 [facility] Patient Information 2014 [place]. Endometrial Ablation Endometrial ablation removes the lining of the uterus (endometrium). It is usually a same-day, outpatient treatment. Ablation helps avoid major surgery, such as surgery to remove the cervix and uterus (hysterectomy). After endometrial ablation, you will have little or no menstrual bleeding and may not be able to have children. However, if you are premenopausal, you will need to use a reliable method of birth control following the procedure because of the small chance that pregnancy can occur. There are different reasons to have this procedure, which include: Heavy periods. Bleeding that is causing anemia. Irregular bleeding. Bleeding fibroids on the lining inside the uterus if they are smaller than 3 centimeters. This procedure should not be done if: You want children in the future. You have severe cramps with your menstrual period. You have precancerous or cancerous cells in your uterus. You were recently pregnant. You have gone through menopause. You have had major surgery on the uterus, such as a cesarean delivery. LET [facility] CARE  PROVIDER KNOW ABOUT: Any allergies you have. All medicines you are taking, including vitamins, herbs, eye drops, creams, and over-the-counter medicines. Previous problems you or members of your family have had with the use of anesthetics. Any blood disorders you have. Previous surgeries you have had. Medical conditions you have. RISKS AND COMPLICATIONS  Generally, this is a safe procedure. However, as with any procedure, complications can occur. Possible complications include: Perforation of the uterus. Bleeding. Infection of the uterus, bladder, or vagina. Injury to surrounding organs. An air bubble to the lung (air embolus). Pregnancy following the procedure. Failure of the procedure to help the problem, requiring hysterectomy. Decreased ability to diagnose cancer in the lining of the uterus. BEFORE THE PROCEDURE The lining of the uterus must be tested to make sure there is no pre-cancerous or cancer cells present. An ultrasound may be performed to look at the size of the uterus and to check for abnormalities. Medicines may be given to thin the lining of the uterus. PROCEDURE  During the procedure, your health care provider will use a tool called a resectoscope to help see inside your uterus. There are different ways to remove  the lining of your uterus.  Radiofrequency  This method uses a radiofrequency-alternating electric current to remove the lining of the uterus. Cryotherapy This method uses extreme cold to freeze the lining of the uterus. Heated-Free Liquid  This method uses heated salt (saline) solution to remove the lining of the uterus. Microwave This method uses high-energy microwaves to heat up the lining of the uterus to remove it. Thermal balloon  This method involves inserting a catheter with a balloon tip into the uterus. The balloon tip is filled with heated fluid to remove the lining of the uterus. AFTER THE PROCEDURE  After your procedure, do not have sexual  intercourse or insert anything into your vagina until permitted by your health care provider. After the procedure, you may experience: Cramps. Vaginal discharge. Frequent urination. Document Released: 06/22/2004 Document Revised: 04/15/2013 Document Reviewed: 01/14/2013 [facility] Patient Information 2014 [place].

## 2013-10-28 NOTE — Progress Notes (Signed)
Subjective:     Patient ID: [name] [name], female   DOB: 07-Apr-1971, 43 y.o.   MRN: [id number]  HPI [name] [name] is a 43 year old black female in complaining of bleeding with nexplanon and pain in left side, she is taking megace, per Dr Elonda Husky, and has known multiple small fibroids.This is 3rd implant and the first to having bleeding issues with, this started in October 2014.  Review of Systems See HPI Reviewed past medical,surgical, social and family history. Reviewed medications and allergies.     Objective:   Physical Exam BP 120/84  Ht 5\' 1"  (1.549 m)  Wt 135 lb (61.236 kg)  BMI 25.52 kg/m2  LMP 10/20/2013   Skin warm and dry.Pelvic: external genitalia is normal in appearance, vagina: scant brown discharge without odor, cervix:smooth,-CMT uterus:slightly enlarged non tender, no masses felt, adnexa: no masses mild tenderness noted LLQ. GC/CHL obtained. Does not want to have any children. Discussed tubal ligation and endo ablation.  Assessment:     Irregular bleeding Fibroids LLQ pain    Plan:    GC/CHL sent   Check CBC, TSH and HIV Review handouts on tubal ligation and endo ablation Rx doxycycline 100 mg 1 bid x 10 days Continue the megace Return in 2 weeks in follow up and decide if wants surgery.

## 2013-10-29 LAB — GC/CHLAMYDIA PROBE AMP

## 2013-10-30 ENCOUNTER — Telehealth: Payer: Self-pay | Admitting: Adult Health

## 2013-10-30 NOTE — Telephone Encounter (Signed)
Left message labs back and normal, and that GC/CHL not done, will do at F/U

## 2013-11-15 ENCOUNTER — Other Ambulatory Visit: Payer: Self-pay | Admitting: Family Medicine

## 2013-11-16 ENCOUNTER — Encounter: Payer: Self-pay | Admitting: Adult Health

## 2013-11-16 ENCOUNTER — Ambulatory Visit (INDEPENDENT_AMBULATORY_CARE_PROVIDER_SITE_OTHER): Payer: PRIVATE HEALTH INSURANCE | Admitting: Adult Health

## 2013-11-16 ENCOUNTER — Encounter (INDEPENDENT_AMBULATORY_CARE_PROVIDER_SITE_OTHER): Payer: Self-pay

## 2013-11-16 VITALS — BP 120/82 | Ht 61.0 in | Wt 136.0 lb

## 2013-11-16 DIAGNOSIS — D259 Leiomyoma of uterus, unspecified: Secondary | ICD-10-CM

## 2013-11-16 DIAGNOSIS — D219 Benign neoplasm of connective and other soft tissue, unspecified: Secondary | ICD-10-CM

## 2013-11-16 DIAGNOSIS — N921 Excessive and frequent menstruation with irregular cycle: Secondary | ICD-10-CM

## 2013-11-16 DIAGNOSIS — N949 Unspecified condition associated with female genital organs and menstrual cycle: Secondary | ICD-10-CM

## 2013-11-16 DIAGNOSIS — Z3009 Encounter for other general counseling and advice on contraception: Secondary | ICD-10-CM

## 2013-11-16 NOTE — Progress Notes (Signed)
Subjective:     Patient ID: [name] [name], female   DOB: 08-05-1971, 43 y.o.   MRN: [id number]  HPI [name] [name] is back to discuss bleeding, has slowed down but not stopped, wants to have tubes tied and ablation.Is tired of bleeding.  Review of Systems See HPI Reviewed past medical,surgical, social and family history. Reviewed medications and allergies.     Objective:   Physical Exam BP 120/82  Ht 5\' 1"  (1.549 m)  Wt 136 lb (61.689 kg)  BMI 25.71 kg/m2  LMP 10/20/2013   Reviewed labs with pt, and discussed BTL and ablation and she wants to proceed with surgery. She wants Dr [name] [name] to do surgery.will get GC/CHL, could not do last visit.  Assessment:    Irregular menstrual bleeding Fibroids  Contraceptive counseling     Plan:     Return in 2 weeks for pre op with Dr [name] [name], for BTL, ablation and remove nexplanon Continue megace GC/CHL sent   Review handouts

## 2013-11-16 NOTE — Patient Instructions (Signed)
Laparoscopic Tubal Ligation Laparoscopic tubal ligation is a procedure that closes the fallopian tubes at a time other than right after childbirth. By closing the fallopian tubes, the eggs that are released from the ovaries cannot enter the uterus and sperm cannot reach the egg. Tubal ligation is also known as getting your "tubes tied." Tubal ligation is done so you will not be able to get pregnant or have a baby.  Although this procedure may be reversed, it should be considered permanent and irreversible. If you want to have future pregnancies, you should not have this procedure.  LET YOUR CAREGIVER KNOW ABOUT:  Allergies to food or medicine.  Medicines taken, including vitamins, herbs, eyedrops, over-the-counter medicines, and creams.  Use of steroids (by mouth or creams).  Previous problems with numbing medicines.  History of bleeding problems or blood clots.  Any recent colds or infections.  Previous surgery.  Other health problems, including diabetes and kidney problems.  Possibility of pregnancy, if this applies.  Any past pregnancies. RISKS AND COMPLICATIONS   Infection.  Bleeding.  Injury to surrounding organs.  Anesthetic side effects.  Failure of the procedure.  Ectopic pregnancy.  Future regret about having the procedure done. BEFORE THE PROCEDURE  Do not take aspirin or blood thinners a week before the procedure or as directed. This can cause bleeding.  Do not eat or drink anything 6 to 8 hours before the procedure. PROCEDURE   You may be given a medicine to help you relax (sedative) before the procedure. You will be given a medicine to make you sleep (general anesthetic) during the procedure.  A tube will be put down your throat to help your breath while under general anesthesia.  Two small cuts (incisions) are made in the lower abdominal area and near the belly button.  Your abdominal area will be inflated with a safe gas (carbon dioxide). This helps  give the surgeon room to operate, visualize, and helps the surgeon avoid other organs.  A thin, lighted tube (laparoscope) with a camera attached is inserted into your abdomen through one of the incisions near the belly button. Other small instruments are also inserted through the other abdominal incision.  The fallopian tubes are located and are either blocked with a ring, clip, or are burned (cauterized).  After the fallopian tubes are blocked, the gas is released from the abdomen.  The incisions will be closed with stitches (sutures), and a bandage may be placed over the incisions. AFTER THE PROCEDURE   You will rest in a recovery room for 1 4 hours until you are stable and doing well.  You will also have some mild abdominal discomfort for 3 7 days. You will be given pain medicine to ease any discomfort.  As long as there are no problems, you may be allowed to go home. Someone will need to drive you home and be with you for at least 24 hours once home.  You may have some mild discomfort in the throat. This is from the tube placed in your throat while you were sleeping.  You may experience discomfort in the shoulder area from some trapped air between the liver and diaphragm. This sensation is normal and will slowly go away on its own. Document Released: 11/19/2000 Document Revised: 02/12/2012 Document Reviewed: 11/24/2011 [facility] Patient Information 2014 [place]. Endometrial Ablation Endometrial ablation removes the lining of the uterus (endometrium). It is usually a same-day, outpatient treatment. Ablation helps avoid major surgery, such as surgery to remove the  cervix and uterus (hysterectomy). After endometrial ablation, you will have little or no menstrual bleeding and may not be able to have children. However, if you are premenopausal, you will need to use a reliable method of birth control following the procedure because of the small chance that pregnancy can occur. There are  different reasons to have this procedure, which include:  Heavy periods.  Bleeding that is causing anemia.  Irregular bleeding.  Bleeding fibroids on the lining inside the uterus if they are smaller than 3 centimeters. This procedure should not be done if:  You want children in the future.  You have severe cramps with your menstrual period.  You have precancerous or cancerous cells in your uterus.  You were recently pregnant.  You have gone through menopause.  You have had major surgery on the uterus, such as a cesarean delivery. LET [facility] CARE PROVIDER KNOW ABOUT:  Any allergies you have.  All medicines you are taking, including vitamins, herbs, eye drops, creams, and over-the-counter medicines.  Previous problems you or members of your family have had with the use of anesthetics.  Any blood disorders you have.  Previous surgeries you have had.  Medical conditions you have. RISKS AND COMPLICATIONS  Generally, this is a safe procedure. However, as with any procedure, complications can occur. Possible complications include:  Perforation of the uterus.  Bleeding.  Infection of the uterus, bladder, or vagina.  Injury to surrounding organs.  An air bubble to the lung (air embolus).  Pregnancy following the procedure.  Failure of the procedure to help the problem, requiring hysterectomy.  Decreased ability to diagnose cancer in the lining of the uterus. BEFORE THE PROCEDURE  The lining of the uterus must be tested to make sure there is no pre-cancerous or cancer cells present.  An ultrasound may be performed to look at the size of the uterus and to check for abnormalities.  Medicines may be given to thin the lining of the uterus. PROCEDURE  During the procedure, your health care provider will use a tool called a resectoscope to help see inside your uterus. There are different ways to remove the lining of your uterus.   Radiofrequency  This method uses a  radiofrequency-alternating electric current to remove the lining of the uterus.  Cryotherapy This method uses extreme cold to freeze the lining of the uterus.  Heated-Free Liquid  This method uses heated salt (saline) solution to remove the lining of the uterus.  Microwave This method uses high-energy microwaves to heat up the lining of the uterus to remove it.  Thermal balloon  This method involves inserting a catheter with a balloon tip into the uterus. The balloon tip is filled with heated fluid to remove the lining of the uterus. AFTER THE PROCEDURE  After your procedure, do not have sexual intercourse or insert anything into your vagina until permitted by your health care provider. After the procedure, you may experience:  Cramps.  Vaginal discharge.  Frequent urination. Document Released: 06/22/2004 Document Revised: 04/15/2013 Document Reviewed: 01/14/2013 [facility] Patient Information 2014 [place]. Return in 2 weeks for pre op with JVF Continue megace

## 2013-11-17 ENCOUNTER — Other Ambulatory Visit: Payer: Self-pay

## 2013-11-17 ENCOUNTER — Telehealth: Payer: Self-pay | Admitting: Family Medicine

## 2013-11-17 LAB — GC/CHLAMYDIA PROBE AMP
CT Probe RNA: NEGATIVE
GC Probe RNA: NEGATIVE

## 2013-11-17 MED ORDER — FLUOXETINE HCL 20 MG PO CAPS: ORAL_CAPSULE | ORAL | Status: DC

## 2013-11-17 NOTE — Telephone Encounter (Signed)
Med refilled.

## 2013-11-23 ENCOUNTER — Encounter: Payer: Self-pay | Admitting: Family Medicine

## 2013-11-23 ENCOUNTER — Ambulatory Visit (INDEPENDENT_AMBULATORY_CARE_PROVIDER_SITE_OTHER): Payer: PRIVATE HEALTH INSURANCE | Admitting: Family Medicine

## 2013-11-23 VITALS — BP 130/84 | HR 80 | Resp 18 | Ht 61.0 in | Wt 135.0 lb

## 2013-11-23 DIAGNOSIS — F411 Generalized anxiety disorder: Secondary | ICD-10-CM

## 2013-11-23 DIAGNOSIS — G4700 Insomnia, unspecified: Secondary | ICD-10-CM

## 2013-11-23 DIAGNOSIS — F3289 Other specified depressive episodes: Secondary | ICD-10-CM

## 2013-11-23 DIAGNOSIS — F32A Depression, unspecified: Secondary | ICD-10-CM

## 2013-11-23 DIAGNOSIS — F329 Major depressive disorder, single episode, unspecified: Secondary | ICD-10-CM

## 2013-11-23 DIAGNOSIS — N921 Excessive and frequent menstruation with irregular cycle: Secondary | ICD-10-CM

## 2013-11-23 DIAGNOSIS — K219 Gastro-esophageal reflux disease without esophagitis: Secondary | ICD-10-CM

## 2013-11-23 MED ORDER — FLUOXETINE HCL 40 MG PO CAPS: 40.0000 mg | ORAL_CAPSULE | Freq: Every day | ORAL | Status: DC

## 2013-11-23 MED ORDER — RABEPRAZOLE SODIUM 20 MG PO TBEC: 20.0000 mg | DELAYED_RELEASE_TABLET | Freq: Every day | ORAL | Status: DC

## 2013-11-23 MED ORDER — BUSPIRONE HCL 5 MG PO TABS: 5.0000 mg | ORAL_TABLET | Freq: Three times a day (TID) | ORAL | Status: DC

## 2013-11-23 NOTE — Assessment & Plan Note (Signed)
Resolved, actually sleeping excessively off medication, a part of her coping strategy I believe,  No med refills of restoril once she ahs used the 26m capsules she just collected

## 2013-11-23 NOTE — Assessment & Plan Note (Signed)
Uncontrolled on megace managed by gyne , upcoming surgery next month

## 2013-11-23 NOTE — Assessment & Plan Note (Signed)
Improved but still not adequately treated, states unable to afford therapy but it did help when she went Increas medication dose and needs to commit to regular exeercise

## 2013-11-23 NOTE — Assessment & Plan Note (Signed)
Severe and uncontrolled symptoms start daily PPI

## 2013-11-23 NOTE — Assessment & Plan Note (Signed)
Uncontrolled anxiety add buspar 3 times daily

## 2013-11-23 NOTE — Patient Instructions (Addendum)
F/u in 3.5 month, call if you need me before  Increase in fluoxetine to 40mg  one  daily  OK to take tWO 20mg  capsules daily till done  New additional medcation for anxiety , take every 8 hours, 8am, 5pm and 1 am   New medication  For reflux  Is aciphex one daily  All the best with surgery   Gastroesophageal Reflux Disease, Adult Gastroesophageal reflux disease (GERD) happens when acid from your stomach goes into your food pipe (esophagus). The acid can cause a burning feeling in your chest. Over time, the acid can make small holes (ulcers) in your food pipe.  HOME CARE  Ask your doctor for advice about:  Losing weight.  Quitting smoking.  Alcohol use.  Avoid foods and drinks that make your problems worse. You may want to avoid:  Caffeine and alcohol.  Chocolate.  Mints.  Garlic and onions.  Spicy foods.  Citrus fruits, such as oranges, lemons, or limes.  Foods that contain tomato, such as sauce, chili, salsa, and pizza.  Fried and fatty foods.  Avoid lying down for 3 hours before you go to bed or before you take a nap.  Eat small meals often, instead of large meals.  Wear loose-fitting clothing. Do not wear anything tight around your waist.  Raise (elevate) the head of your bed 6 to 8 inches with wood blocks. Using extra pillows does not help.  Only take medicines as told by your doctor.  Do not take aspirin or ibuprofen. GET HELP RIGHT AWAY IF:   You have pain in your arms, neck, jaw, teeth, or back.  Your pain gets worse or changes.  You feel sick to your stomach (nauseous), throw up (vomit), or sweat (diaphoresis).  You feel short of breath, or you pass out (faint).  Your throw up is green, yellow, black, or looks like coffee grounds or blood.  Your poop (stool) is red, bloody, or black. MAKE SURE YOU:   Understand these instructions.  Will watch your condition.  Will get help right away if you are not doing well or get worse. Document  Released: 01/30/2008 Document Revised: 11/05/2011 Document Reviewed: 03/02/2011 [facility] Patient Information 2014 [place], [place].

## 2013-11-23 NOTE — Progress Notes (Signed)
   Subjective:    Patient ID: [name] [name], female    DOB: 1970/10/07, 43 y.o.   MRN: [id number]  HPI Pt initially scheduled annual exam, however, due to excessive menses and pain with menses, she has had gyne eval including pelvic  Exam ,and is anticipating endometrial ablation and BTL in the next month, no indiication for pelvic currently and mammogram is up to date. Focus is on hj her mental health, she has no other complaints   Review of Systems See HPI Denies recent fever or chills. Denies sinus pressure, nasal congestion, ear pain or sore throat.Anticipates increased allergy symptoms with Spring, will use OTC meds Denies chest congestion, productive cough or wheezing. Denies chest pains, palpitations and leg swelling Denies abdominal pain, nausea, vomiting,diarrhea or constipation.   Denies dysuria, frequency, hesitancy or incontinence. Denies joint pain, swelling and limitation in mobility. Denies headaches, seizures, numbness, or tingling. C/o uncontrolled depression and anxiety, spending excesive amount of time sleeping, no regular  Exercise, and states she still has more anxiety than is healthy gets readily agitated on the job. Less tearfulness and anger, not suicidal or homicidal       Objective:   Physical Exam BP 130/84  Pulse 80  Resp 18  Ht 5\' 1"  (1.549 m)  Wt 135 lb 0.6 oz (61.254 kg)  BMI 25.53 kg/m2  SpO2 97%  LMP 10/20/2013 Patient alert and oriented and in no cardiopulmonary distress.  HEENT: No facial asymmetry, EOMI, no sinus tenderness,  oropharynx pink and moist.  Neck supple no adenopathy.  Chest: Clear to auscultation bilaterally.  CVS: S1, S2 no murmurs, no S3.  ABD: Soft non tender. Bowel sounds normal.  Ext: No edema  MS: Adequate ROM spine, shoulders, hips and knees.  Skin: Intact, no ulcerations or rash noted.  Psych: Good eye contact,fairly  normal affect. Memory intact mildly  anxiousnot r depressed appearing.  CNS: CN 2-12  intact, power,  normal throughout.        Assessment & Plan:  Depression Improved but still not adequately treated, states unable to afford therapy but it did help when she went Increas medication dose and needs to commit to regular exeercise  Generalized anxiety disorder Uncontrolled anxiety add buspar 3 times daily  Insomnia Resolved, actually sleeping excessively off medication, a part of her coping strategy I believe,  No med refills of restoril once she ahs used the 46m capsules she just collected  GERD Severe and uncontrolled symptoms start daily PPI  Irregular intermenstrual bleeding Uncontrolled on megace managed by gyne , upcoming surgery next month

## 2013-12-03 ENCOUNTER — Encounter (HOSPITAL_COMMUNITY): Payer: Self-pay | Admitting: Pharmacy Technician

## 2013-12-03 ENCOUNTER — Ambulatory Visit (INDEPENDENT_AMBULATORY_CARE_PROVIDER_SITE_OTHER): Payer: PRIVATE HEALTH INSURANCE | Admitting: Obstetrics and Gynecology

## 2013-12-03 ENCOUNTER — Encounter: Payer: Self-pay | Admitting: Obstetrics and Gynecology

## 2013-12-03 VITALS — BP 138/90 | Ht 61.0 in | Wt 134.8 lb

## 2013-12-03 DIAGNOSIS — Z029 Encounter for administrative examinations, unspecified: Secondary | ICD-10-CM

## 2013-12-03 DIAGNOSIS — D219 Benign neoplasm of connective and other soft tissue, unspecified: Secondary | ICD-10-CM

## 2013-12-03 DIAGNOSIS — Z3009 Encounter for other general counseling and advice on contraception: Secondary | ICD-10-CM | POA: Insufficient documentation

## 2013-12-03 DIAGNOSIS — Z01818 Encounter for other preprocedural examination: Secondary | ICD-10-CM

## 2013-12-03 DIAGNOSIS — N938 Other specified abnormal uterine and vaginal bleeding: Secondary | ICD-10-CM

## 2013-12-03 DIAGNOSIS — N949 Unspecified condition associated with female genital organs and menstrual cycle: Secondary | ICD-10-CM

## 2013-12-03 DIAGNOSIS — D259 Leiomyoma of uterus, unspecified: Secondary | ICD-10-CM

## 2013-12-03 NOTE — Progress Notes (Signed)
Patient ID: [name] [name], female   DOB: [birth year], 43 y.o.   MRN: [id number] Preoperative History and Physical  [name] [name] is a 43 y.o. G2P0020 here for surgical manaement of BTL, ablation and remove nexplanon.  Pt notes a h/o fibroids; she is unsure of the size though she had an US performed.  She also she wants an implanon removed.  She denies a h/o abdominal surgeries.  Pt had a cervical biopsy.  .   Proposed surgery: Remove fallopian tubes, scald the endometrium, remove the implanon, and remove the  right thigh skin tag.  Past Medical History  Diagnosis Date  . Hyperlipidemia     x4 years   . Abnormal vaginal Pap smear     Dr Glo Herring  . Allergic rhinitis   . Kidney stones   . Hypertension     whenshe wasa young but she changed diet and reducd salt intake   . Vaginal Pap smear, abnormal   . Fibroids 10/28/2013  . Irregular intermenstrual bleeding 10/28/2013  . Unspecified symptom associated with female genital organs 10/28/2013   Past Surgical History  Procedure Laterality Date  . Cervical biopsy  2010    for abnormal pap   . Cyst removed from both breast , all benign both breast    . Breast reduction surgery  Jan 2007   OB History   Grav Para Term Preterm Abortions TAB SAB Ect Mult Living   2    2 2          Patient denies any cervical dysplasia or STIs.  (Not in a hospital admission)  No Known Allergies Social History:   reports that she quit smoking about 4 years ago. Her smoking use included Cigarettes. She has a 4 pack-year smoking history. She has never used smokeless tobacco. She reports that she drinks alcohol. She reports that she does not use illicit drugs. Family History  Problem Relation Age of Onset  . Fibromyalgia Mother   . Hypertension Mother   . Thyroid disease Mother   . Arthritis Mother   . Alcohol abuse Father     Review of Systems: Noncontributory  PHYSICAL EXAM: Blood pressure 138/90, height 5\' 1"  (1.549 m), weight 134 lb 12.8 oz  (61.145 kg). General appearance - alert, well appearing, and in no distress Chest - clear to auscultation, no wheezes, rales or rhonchi, symmetric air entry Heart - normal rate and regular rhythm Abdomen - soft, nontender, nondistended, no masses or organomegaly Pelvic - Chaperone present for exam which was performed with pt's permission. Uterus is irregular, retroverted,  8 week size  Extremities - peripheral pulses normal, no pedal edema, no clubbing or cyanosis  Labs: No results found for this or any previous visit (from the past 336 hour(s)).  Imaging Studies: No results found.  Assessment: Patient Active Problem List   Diagnosis Date Noted  . Generalized anxiety disorder 11/23/2013  . Fibroids 10/28/2013  . Irregular intermenstrual bleeding 10/28/2013  . Unspecified symptom associated with female genital organs 10/28/2013  . Depression 07/31/2013  . Insomnia 07/31/2013  . Routine general medical examination at a health care facility 09/22/2012  . Fatigue 08/10/2011  . Dysphagia 12/27/2010  . Vitamin D deficiency 12/24/2010  . HEADACHE 09/07/2010  . HYPERLIPIDEMIA 11/30/2009  . ALLERGIC RHINITIS CAUSE UNSPECIFIED 07/27/2009  . GERD 07/27/2009    Plan: Patient will undergo surgical management with Dr. Glo Herring.   The risks of surgery were discussed in detail with the patient including but not limited to:  bleeding which may require transfusion or reoperation; infection which may require antibiotics; injury to surrounding organs which may involve bowel, bladder, ureters ; need for additional procedures including laparoscopy or laparotomy; thromboembolic phenomenon, surgical site problems and other postoperative/anesthesia complications. Likelihood of success in alleviating the patient's condition was discussed. Routine postoperative instructions will be reviewed with the patient and her family in detail after surgery.  The patient concurred with the proposed plan, giving informed  written consent for the surgery.  Patient has been NPO since last night she will remain NPO for procedure.  Anesthesia and OR aware.  Preoperative prophylactic antibiotics and SCDs ordered on call to the OR.  To OR when ready.  Counseled over uterine fibroids and saplingectomy using visual details. Informed pt recommendation would be to remove tubes, scald the endometrium, by ablation, remove the implanon, and remove the  right thigh skin tag, The pt reported a Tuesday would be her preferred surgery day.

## 2013-12-03 NOTE — Patient Instructions (Addendum)
To amy barnes to schedule surgeryEndometrial Ablation Endometrial ablation removes the lining of the uterus (endometrium). It is usually a same-day, outpatient treatment. Ablation helps avoid major surgery, such as surgery to remove the cervix and uterus (hysterectomy). After endometrial ablation, you will have little or no menstrual bleeding and may not be able to have children. However, if you are premenopausal, you will need to use a reliable method of birth control following the procedure because of the small chance that pregnancy can occur. There are different reasons to have this procedure, which include:  Heavy periods.  Bleeding that is causing anemia.  Irregular bleeding.  Bleeding fibroids on the lining inside the uterus if they are smaller than 3 centimeters. This procedure should not be done if:  You want children in the future.  You have severe cramps with your menstrual period.  You have precancerous or cancerous cells in your uterus.  You were recently pregnant.  You have gone through menopause.  You have had major surgery on the uterus, such as a cesarean delivery. LET [facility] CARE PROVIDER KNOW ABOUT:  Any allergies you have.  All medicines you are taking, including vitamins, herbs, eye drops, creams, and over-the-counter medicines.  Previous problems you or members of your family have had with the use of anesthetics.  Any blood disorders you have.  Previous surgeries you have had.  Medical conditions you have. RISKS AND COMPLICATIONS  Generally, this is a safe procedure. However, as with any procedure, complications can occur. Possible complications include:  Perforation of the uterus.  Bleeding.  Infection of the uterus, bladder, or vagina.  Injury to surrounding organs.  An air bubble to the lung (air embolus).  Pregnancy following the procedure.  Failure of the procedure to help the problem, requiring hysterectomy.  Decreased ability to  diagnose cancer in the lining of the uterus. BEFORE THE PROCEDURE  The lining of the uterus must be tested to make sure there is no pre-cancerous or cancer cells present.  An ultrasound may be performed to look at the size of the uterus and to check for abnormalities.  Medicines may be given to thin the lining of the uterus. PROCEDURE  During the procedure, your health care provider will use a tool called a resectoscope to help see inside your uterus. There are different ways to remove the lining of your uterus.   Radiofrequency  This method uses a radiofrequency-alternating electric current to remove the lining of the uterus.  Cryotherapy This method uses extreme cold to freeze the lining of the uterus.  Heated-Free Liquid  This method uses heated salt (saline) solution to remove the lining of the uterus.  Microwave This method uses high-energy microwaves to heat up the lining of the uterus to remove it.  Thermal balloon  This method involves inserting a catheter with a balloon tip into the uterus. The balloon tip is filled with heated fluid to remove the lining of the uterus. AFTER THE PROCEDURE  After your procedure, do not have sexual intercourse or insert anything into your vagina until permitted by your health care provider. After the procedure, you may experience:  Cramps.  Vaginal discharge.  Frequent urination. Document Released: 06/22/2004 Document Revised: 04/15/2013 Document Reviewed: 01/14/2013 [facility] Patient Information 2014 [place].

## 2013-12-03 NOTE — Progress Notes (Signed)
Surgery scheduled per Dr. Glo Herring.Marland Kitchenahb

## 2013-12-04 ENCOUNTER — Telehealth: Payer: Self-pay | Admitting: Obstetrics & Gynecology

## 2013-12-04 ENCOUNTER — Telehealth: Payer: Self-pay

## 2013-12-04 LAB — GC/CHLAMYDIA PROBE AMP
CT Probe RNA: NEGATIVE
GC Probe RNA: NEGATIVE

## 2013-12-04 NOTE — Telephone Encounter (Signed)
Pt aware [name] is out of the office today, Friday, 12/04/13. Left message letting pt know I would get info to [place]

## 2013-12-07 ENCOUNTER — Other Ambulatory Visit: Payer: Self-pay | Admitting: *Deleted

## 2013-12-07 MED ORDER — MEGESTROL ACETATE 40 MG PO TABS: ORAL_TABLET | ORAL | Status: DC

## 2013-12-07 NOTE — Telephone Encounter (Signed)
Megace refilled. 

## 2013-12-07 NOTE — Telephone Encounter (Signed)
Left message letting pt know Megace had been sent to [facility] in [place]. [place]

## 2013-12-07 NOTE — Telephone Encounter (Signed)
Left message letting pt know Megace was sent to [facility] in [place]. [place]

## 2013-12-07 NOTE — Telephone Encounter (Signed)
Patient informed FMLA forms completed with dates of 12/14/2013 until 12/22/2012. Pt states works 12 hours shifts.

## 2013-12-10 NOTE — Patient Instructions (Addendum)
[name] [name]  12/10/2013   Your procedure is scheduled on:  12/15/13  Report to [facility] at [place] AM.  Call this number if you have problems the morning of surgery: [phone]   Remember:   Do not eat food or drink liquids after midnight.   Take these medicines the morning of surgery with A SIP OF WATER: buspar, prozac, toradol, aciphex   Do not wear jewelry, make-up or nail polish.  Do not wear lotions, powders, or perfumes. You may wear deodorant.  Do not shave 48 hours prior to surgery. Men may shave face and neck.  Do not bring valuables to the hospital.  [facility] is not responsible                  for any belongings or valuables.               Contacts, dentures or bridgework may not be worn into surgery.  Leave suitcase in the car. After surgery it may be brought to your room.  For patients admitted to the hospital, discharge time is determined by your                treatment team.               Patients discharged the day of surgery will not be allowed to drive  home.  Name and phone number of your driver: family  Special Instructions: N/A   Please read over the following fact sheets that you were given: Anesthesia Post-op Instructions and Care and Recovery After Surgery   PATIENT INSTRUCTIONS POST-ANESTHESIA  IMMEDIATELY FOLLOWING SURGERY:  Do not drive or operate machinery for the first twenty four hours after surgery.  Do not make any important decisions for twenty four hours after surgery or while taking narcotic pain medications or sedatives.  If you develop intractable nausea and vomiting or a severe headache please notify your doctor immediately.  FOLLOW-UP:  Please make an appointment with your surgeon as instructed. You do not need to follow up with anesthesia unless specifically instructed to do so.  WOUND CARE INSTRUCTIONS (if applicable):  Keep a dry clean dressing on the anesthesia/puncture wound site if there is drainage.  Once the wound has quit  draining you may leave it open to air.  Generally you should leave the bandage intact for twenty four hours unless there is drainage.  If the epidural site drains for more than 36-48 hours please call the anesthesia department.  QUESTIONS?:  Please feel free to call your physician or the hospital operator if you have any questions, and they will be happy to assist you.      Salpingectomy Salpingectomy, also called tubectomy, is the surgical removal of one of the fallopian tubes. The fallopian tubes are tubes that are connected to the uterus. These tubes transport the egg from the ovary to the uterus. A salpingectomy may be done for various reasons, including:   A tubal (ectopic) pregnancy. This is especially true if the tube ruptures.  An infected fallopian tube.  The need to remove the fallopian tube when removing an ovary with a cyst or tumor.  The need to remove the fallopian tube when removing the uterus.  Cancer of the fallopian tube or nearby organs. Removing one fallopian tube does not prevent you from becoming pregnant. It also does not cause problems with your menstrual periods.  LET [facility] CARE PROVIDER KNOW ABOUT:  Any allergies you have.  All  medicines you are taking, including vitamins, herbs, eye drops, creams, and over-the-counter medicines.  Previous problems you or members of your family have had with the use of anesthetics.  Any blood disorders you have.  Previous surgeries you have had.  Medical conditions you have. RISKS AND COMPLICATIONS  Generally, this is a safe procedure. However, as with any procedure, complications can occur. Possible complications include:  Injury to surrounding organs.  Bleeding.  Infection.  Problems related to anesthesia. BEFORE THE PROCEDURE  Ask your health care provider about changing or stopping your regular medicines. You may need to stop taking certain medicines, such as aspirin or blood thinners, at least 1 week  before the surgery.  Do not eat or drink anything for at least 8 hours before the surgery.  If you smoke, do not smoke for at least 2 weeks before the surgery.  Make plans to have someone drive you home after the procedure or after your hospital stay. Also arrange for someone to help you with activities during recovery. PROCEDURE   You will be given medicine to help you relax before the procedure (sedative). You will then be given medicine to make you sleep through the procedure (general anesthetic). These medicines will be given through an IV access tube that is put into one of your veins.  Once you are asleep, your lower abdomen will be shaved and cleaned. A thin, flexible tube (catheter) will be placed in your bladder.  The surgeon may use a laparoscopic, robotic, or open technique for this surgery:  In the laparoscopic technique, the surgery is done through two small cuts (incisions) in the abdomen. A thin, lighted tube with a tiny camera on the end (laparoscope) is inserted into one of the incisions. The tools needed for the procedure are put through the other incision.  A robotic technique may be chosen to perform complex surgery in a small space. In the robotic technique, small incisions will be made. A camera and surgical instruments are passed through the incisions. Surgical instruments will be controlled with the help of a robotic arm.  In the open technique, the surgery is done through one large incision in the abdomen.  Using any of these techniques, the surgeon removes the fallopian tube from where it attaches to the uterus. The blood vessels will be clamped and tied.  The surgeon then uses staples or stitches to close the incision or incisions. AFTER THE PROCEDURE   You will be taken to a recovery area where your progress will be monitored for 1 3 hours.  If the laparoscopic technique was used, you may be allowed to go home after several hours. You may have some shoulder  pain after the laparoscopic procedure. This is normal and usually goes away in a day or two.  If the open technique was used, you will be admitted to the hospital for a couple of days.  You will be given pain medicine if needed.  The IV access tube and catheter will be removed before you are discharged. Document Released: 12/30/2008 Document Revised: 06/03/2013 Document Reviewed: 02/04/2013 [facility] Patient Information 2014 [place], [place]. Endometrial Ablation Endometrial ablation removes the lining of the uterus (endometrium). It is usually a same-day, outpatient treatment. Ablation helps avoid major surgery, such as surgery to remove the cervix and uterus (hysterectomy). After endometrial ablation, you will have little or no menstrual bleeding and may not be able to have children. However, if you are premenopausal, you will need to use a reliable method  of birth control following the procedure because of the small chance that pregnancy can occur. There are different reasons to have this procedure, which include:  Heavy periods.  Bleeding that is causing anemia.  Irregular bleeding.  Bleeding fibroids on the lining inside the uterus if they are smaller than 3 centimeters. This procedure should not be done if:  You want children in the future.  You have severe cramps with your menstrual period.  You have precancerous or cancerous cells in your uterus.  You were recently pregnant.  You have gone through menopause.  You have had major surgery on the uterus, such as a cesarean delivery. LET [facility] CARE PROVIDER KNOW ABOUT:  Any allergies you have.  All medicines you are taking, including vitamins, herbs, eye drops, creams, and over-the-counter medicines.  Previous problems you or members of your family have had with the use of anesthetics.  Any blood disorders you have.  Previous surgeries you have had.  Medical conditions you have. RISKS AND COMPLICATIONS    Generally, this is a safe procedure. However, as with any procedure, complications can occur. Possible complications include:  Perforation of the uterus.  Bleeding.  Infection of the uterus, bladder, or vagina.  Injury to surrounding organs.  An air bubble to the lung (air embolus).  Pregnancy following the procedure.  Failure of the procedure to help the problem, requiring hysterectomy.  Decreased ability to diagnose cancer in the lining of the uterus. BEFORE THE PROCEDURE  The lining of the uterus must be tested to make sure there is no pre-cancerous or cancer cells present.  An ultrasound may be performed to look at the size of the uterus and to check for abnormalities.  Medicines may be given to thin the lining of the uterus. PROCEDURE  During the procedure, your health care provider will use a tool called a resectoscope to help see inside your uterus. There are different ways to remove the lining of your uterus.   Radiofrequency  This method uses a radiofrequency-alternating electric current to remove the lining of the uterus.  Cryotherapy This method uses extreme cold to freeze the lining of the uterus.  Heated-Free Liquid  This method uses heated salt (saline) solution to remove the lining of the uterus.  Microwave This method uses high-energy microwaves to heat up the lining of the uterus to remove it.  Thermal balloon  This method involves inserting a catheter with a balloon tip into the uterus. The balloon tip is filled with heated fluid to remove the lining of the uterus. AFTER THE PROCEDURE  After your procedure, do not have sexual intercourse or insert anything into your vagina until permitted by your health care provider. After the procedure, you may experience:  Cramps.  Vaginal discharge.  Frequent urination. Document Released: 06/22/2004 Document Revised: 04/15/2013 Document Reviewed: 01/14/2013 [facility] Patient Information 2014 [place]. Hysteroscopy Hysteroscopy is a procedure used for looking inside the womb (uterus). It may be done for various reasons, including:  To evaluate abnormal bleeding, fibroid (benign, noncancerous) tumors, polyps, scar tissue (adhesions), and possibly cancer of the uterus.  To look for lumps (tumors) and other uterine growths.  To look for causes of why a woman cannot get pregnant (infertility), causes of recurrent loss of pregnancy (miscarriages), or a lost intrauterine device (IUD).  To perform a sterilization by blocking the fallopian tubes from inside the uterus. In this procedure, a thin, flexible tube with a tiny light and camera on the end of it (  hysteroscope) is used to look inside the uterus. A hysteroscopy should be done right after a menstrual period to be sure you are not pregnant. LET [facility] CARE PROVIDER KNOW ABOUT:   Any allergies you have.  All medicines you are taking, including vitamins, herbs, eye drops, creams, and over-the-counter medicines.  Previous problems you or members of your family have had with the use of anesthetics.  Any blood disorders you have.  Previous surgeries you have had.  Medical conditions you have. RISKS AND COMPLICATIONS  Generally, this is a safe procedure. However, as with any procedure, complications can occur. Possible complications include:  Putting a hole in the uterus.  Excessive bleeding.  Infection.  Damage to the cervix.  Injury to other organs.  Allergic reaction to medicines.  Too much fluid used in the uterus for the procedure. BEFORE THE PROCEDURE   Ask your health care provider about changing or stopping any regular medicines.  Do not take aspirin or blood thinners for 1 week before the procedure, or as directed by your health care provider. These can cause bleeding.  If you smoke, do not smoke for 2 weeks before the procedure.  In some cases, a medicine is placed in the cervix the day before the  procedure. This medicine makes the cervix have a larger opening (dilate). This makes it easier for the instrument to be inserted into the uterus during the procedure.  Do not eat or drink anything for at least 8 hours before the surgery.  Arrange for someone to take you home after the procedure. PROCEDURE   You may be given a medicine to relax you (sedative). You may also be given one of the following:  A medicine that numbs the area around the cervix (local anesthetic).  A medicine that makes you sleep through the procedure (general anesthetic).  The hysteroscope is inserted through the vagina into the uterus. The camera on the hysteroscope sends a picture to a TV screen. This gives the surgeon a good view inside the uterus.  During the procedure, air or a liquid is put into the uterus, which allows the surgeon to see better.  Sometimes, tissue is gently scraped from inside the uterus. These tissue samples are sent to a lab for testing. AFTER THE PROCEDURE   If you had a general anesthetic, you may be groggy for a couple hours after the procedure.  If you had a local anesthetic, you will be able to go home as soon as you are stable and feel ready.  You may have some cramping. This normally lasts for a couple days.  You may have bleeding, which varies from light spotting for a few days to menstrual-like bleeding for 3 7 days. This is normal.  If your test results are not back during the visit, make an appointment with your health care provider to find out the results. Document Released: 11/19/2000 Document Revised: 06/03/2013 Document Reviewed: 03/12/2013 [facility] Patient Information 2014 [place], [place]. Dilation and Curettage or Vacuum Curettage Dilation and curettage (D&C) and vacuum curettage are minor procedures. A D&C involves stretching (dilation) the cervix and scraping (curettage) the inside lining of the womb (uterus). During a D&C, tissue is gently scraped from the inside  lining of the uterus. During a vacuum curettage, the lining and tissue in the uterus are removed with the use of gentle suction.  Curettage may be performed to either diagnose or treat a problem. As a diagnostic procedure, curettage is performed to examine tissues from  the uterus. A diagnostic curettage may be performed for the following symptoms:   Irregular bleeding in the uterus.   Bleeding with the development of clots.   Spotting between menstrual periods.   Prolonged menstrual periods.   Bleeding after menopause.   No menstrual period (amenorrhea).   A change in size and shape of the uterus.  As a treatment procedure, curettage may be performed for the following reasons:   Removal of an IUD (intrauterine device).   Removal of retained placenta after giving birth. Retained placenta can cause an infection or bleeding severe enough to require transfusions.   Abortion.   Miscarriage.   Removal of polyps inside the uterus.   Removal of uncommon types of noncancerous lumps (fibroids).  LET [facility] CARE PROVIDER KNOW ABOUT:   Any allergies you have.   All medicines you are taking, including vitamins, herbs, eye drops, creams, and over-the-counter medicines.   Previous problems you or members of your family have had with the use of anesthetics.   Any blood disorders you have.   Previous surgeries you have had.   Medical conditions you have. RISKS AND COMPLICATIONS  Generally, this is a safe procedure. However, as with any procedure, complications can occur. Possible complications include:  Excessive bleeding.   Infection of the uterus.   Damage to the cervix.   Development of scar tissue (adhesions) inside the uterus, later causing abnormal amounts of menstrual bleeding.   Complications from the general anesthetic, if a general anesthetic is used.   Putting a hole (perforation) in the uterus. This is rare.  BEFORE THE PROCEDURE   Eat  and drink before the procedure only as directed by your health care provider.   Arrange for someone to take you home.  PROCEDURE  This procedure usually takes about 15 30 minutes.  You will be given one of the following:  A medicine that numbs the area in and around the cervix (local anesthetic).   A medicine to make you sleep through the procedure (general anesthetic).  You will lie on your back with your legs in stirrups.   A warm metal or plastic instrument (speculum) will be placed in your vagina to keep it open and to allow the health care provider to see the cervix.  There are two ways in which your cervix can be softened and dilated. These include:   Taking a medicine.   Having thin rods (laminaria) inserted into your cervix.   A curved tool (curette) will be used to scrape cells from the inside lining of the uterus. In some cases, gentle suction is applied with the curette. The curette will then be removed.  AFTER THE PROCEDURE   You will rest in the recovery area until you are stable and are ready to go home.   You may feel sick to your stomach (nauseous) or throw up (vomit) if you were given a general anesthetic.   You may have a sore throat if a tube was placed in your throat during general anesthesia.   You may have light cramping and bleeding. This may last for 2 days to 2 weeks after the procedure.   Your uterus needs to make a new lining after the procedure. This may make your next period late. Document Released: 08/13/2005 Document Revised: 04/15/2013 Document Reviewed: 03/12/2013 [facility] Patient Information 2014 [place], [place].

## 2013-12-11 ENCOUNTER — Encounter (HOSPITAL_COMMUNITY): Payer: Self-pay

## 2013-12-11 ENCOUNTER — Other Ambulatory Visit: Payer: Self-pay | Admitting: Obstetrics and Gynecology

## 2013-12-11 ENCOUNTER — Encounter (HOSPITAL_COMMUNITY)
Admission: RE | Admit: 2013-12-11 | Discharge: 2013-12-11 | Disposition: A | Discharge status: Home / Self Care | Payer: PRIVATE HEALTH INSURANCE | Source: Ambulatory Visit | Attending: Obstetrics and Gynecology | Admitting: Obstetrics and Gynecology

## 2013-12-11 DIAGNOSIS — Z01812 Encounter for preprocedural laboratory examination: Secondary | ICD-10-CM | POA: Insufficient documentation

## 2013-12-11 LAB — CBC
HCT: 38.2 % (ref 36.0–46.0)
Hemoglobin: 13.1 g/dL (ref 12.0–15.0)
MCH: 30 pg (ref 26.0–34.0)
MCHC: 34.3 g/dL (ref 30.0–36.0)
MCV: 87.6 fL (ref 78.0–100.0)
Platelets: 372 10*3/uL (ref 150–400)
RBC: 4.36 MIL/uL (ref 3.87–5.11)
RDW: 13.9 % (ref 11.5–15.5)
WBC: 6.4 10*3/uL (ref 4.0–10.5)

## 2013-12-11 LAB — URINALYSIS, ROUTINE W REFLEX MICROSCOPIC
Bilirubin Urine: NEGATIVE
Glucose, UA: NEGATIVE mg/dL
Ketones, ur: NEGATIVE mg/dL
Nitrite: NEGATIVE
Protein, ur: NEGATIVE mg/dL
Specific Gravity, Urine: <1.005 — ABNORMAL LOW (ref 1.005–1.030)
Urobilinogen, UA: 0.2 mg/dL (ref 0.0–1.0)
pH: 6.5 (ref 5.0–8.0)

## 2013-12-11 LAB — URINE MICROSCOPIC-ADD ON

## 2013-12-11 LAB — HCG, SERUM, QUALITATIVE: Preg, Serum: NEGATIVE

## 2013-12-11 NOTE — Progress Notes (Signed)
Spoke to [facility] at Dr. Johnnye Sima office about [name] [name] pre-op UA findings (see results). Stated that she would report findings to Dr. Glo Herring. No further orders at this time.

## 2013-12-13 LAB — URINE CULTURE
Colony Count: NO GROWTH
Culture: NO GROWTH
Special Requests: NORMAL

## 2013-12-15 ENCOUNTER — Encounter (HOSPITAL_COMMUNITY): Payer: PRIVATE HEALTH INSURANCE | Admitting: Anesthesiology

## 2013-12-15 ENCOUNTER — Ambulatory Visit (HOSPITAL_COMMUNITY)
Admission: RE | Admit: 2013-12-15 | Discharge: 2013-12-15 | Disposition: A | Discharge status: Home / Self Care | Payer: PRIVATE HEALTH INSURANCE | Source: Ambulatory Visit | Attending: Obstetrics and Gynecology | Admitting: Obstetrics and Gynecology

## 2013-12-15 ENCOUNTER — Encounter (HOSPITAL_COMMUNITY)
Admission: RE | Disposition: A | Discharge status: Home / Self Care | Payer: Self-pay | Source: Ambulatory Visit | Attending: Obstetrics and Gynecology

## 2013-12-15 ENCOUNTER — Encounter (HOSPITAL_COMMUNITY): Payer: Self-pay | Admitting: Obstetrics and Gynecology

## 2013-12-15 ENCOUNTER — Ambulatory Visit (HOSPITAL_COMMUNITY): Payer: PRIVATE HEALTH INSURANCE | Admitting: Anesthesiology

## 2013-12-15 DIAGNOSIS — Z302 Encounter for sterilization: Secondary | ICD-10-CM

## 2013-12-15 DIAGNOSIS — K219 Gastro-esophageal reflux disease without esophagitis: Secondary | ICD-10-CM | POA: Insufficient documentation

## 2013-12-15 DIAGNOSIS — Z3009 Encounter for other general counseling and advice on contraception: Secondary | ICD-10-CM

## 2013-12-15 DIAGNOSIS — I10 Essential (primary) hypertension: Secondary | ICD-10-CM | POA: Insufficient documentation

## 2013-12-15 DIAGNOSIS — E785 Hyperlipidemia, unspecified: Secondary | ICD-10-CM | POA: Insufficient documentation

## 2013-12-15 DIAGNOSIS — L909 Atrophic disorder of skin, unspecified: Secondary | ICD-10-CM

## 2013-12-15 DIAGNOSIS — N938 Other specified abnormal uterine and vaginal bleeding: Secondary | ICD-10-CM | POA: Insufficient documentation

## 2013-12-15 DIAGNOSIS — Z79899 Other long term (current) drug therapy: Secondary | ICD-10-CM | POA: Insufficient documentation

## 2013-12-15 DIAGNOSIS — Z87891 Personal history of nicotine dependence: Secondary | ICD-10-CM | POA: Insufficient documentation

## 2013-12-15 DIAGNOSIS — Z3046 Encounter for surveillance of implantable subdermal contraceptive: Secondary | ICD-10-CM

## 2013-12-15 DIAGNOSIS — F3289 Other specified depressive episodes: Secondary | ICD-10-CM | POA: Insufficient documentation

## 2013-12-15 DIAGNOSIS — N949 Unspecified condition associated with female genital organs and menstrual cycle: Secondary | ICD-10-CM | POA: Insufficient documentation

## 2013-12-15 DIAGNOSIS — L919 Hypertrophic disorder of the skin, unspecified: Secondary | ICD-10-CM

## 2013-12-15 DIAGNOSIS — N920 Excessive and frequent menstruation with regular cycle: Secondary | ICD-10-CM

## 2013-12-15 DIAGNOSIS — F329 Major depressive disorder, single episode, unspecified: Secondary | ICD-10-CM | POA: Insufficient documentation

## 2013-12-15 HISTORY — PX: LAPAROSCOPIC BILATERAL SALPINGECTOMY: SHX5889

## 2013-12-15 HISTORY — PX: REMOVAL OF NON VAGINAL CONTRACEPTIVE DEVICE: SHX6219

## 2013-12-15 HISTORY — PX: DILITATION & CURRETTAGE/HYSTROSCOPY WITH THERMACHOICE ABLATION: SHX5569

## 2013-12-15 HISTORY — PX: EXCISION OF SKIN TAG: SHX6270

## 2013-12-15 SURGERY — EXCISION, SKIN TAG
Anesthesia: General | Site: Vagina | Laterality: Right

## 2013-12-15 MED ORDER — LABETALOL HCL 5 MG/ML IV SOLN
INTRAVENOUS | Status: DC | PRN
  Administered 2013-12-15 (×2): 5 mg via INTRAVENOUS

## 2013-12-15 MED ORDER — GLYCOPYRROLATE 0.2 MG/ML IJ SOLN
INTRAMUSCULAR | Status: AC
Start: 2013-12-15 — End: 2013-12-15
  Filled 2013-12-15: qty 1

## 2013-12-15 MED ORDER — LIDOCAINE HCL (CARDIAC) 20 MG/ML IV SOLN
INTRAVENOUS | Status: DC | PRN
  Administered 2013-12-15: 50 mg via INTRAVENOUS

## 2013-12-15 MED ORDER — NEOSTIGMINE METHYLSULFATE 1 MG/ML IJ SOLN
INTRAMUSCULAR | Status: AC
  Filled 2013-12-15: qty 2

## 2013-12-15 MED ORDER — MIDAZOLAM HCL 2 MG/2ML IJ SOLN
INTRAMUSCULAR | Status: AC
  Filled 2013-12-15: qty 2

## 2013-12-15 MED ORDER — SUCCINYLCHOLINE CHLORIDE 20 MG/ML IJ SOLN
INTRAMUSCULAR | Status: AC
  Filled 2013-12-15: qty 1

## 2013-12-15 MED ORDER — LIDOCAINE HCL (PF) 1 % IJ SOLN
INTRAMUSCULAR | Status: AC
  Filled 2013-12-15: qty 5

## 2013-12-15 MED ORDER — CEFAZOLIN SODIUM-DEXTROSE 2-3 GM-% IV SOLR
2.0000 g | INTRAVENOUS | Status: AC
  Administered 2013-12-15: 2 g via INTRAVENOUS

## 2013-12-15 MED ORDER — MIDAZOLAM HCL 2 MG/2ML IJ SOLN
1.0000 mg | INTRAMUSCULAR | Status: DC | PRN
  Administered 2013-12-15 (×2): 2 mg via INTRAVENOUS
  Filled 2013-12-15: qty 2

## 2013-12-15 MED ORDER — FENTANYL CITRATE 0.05 MG/ML IJ SOLN
INTRAMUSCULAR | Status: AC
  Filled 2013-12-15: qty 2

## 2013-12-15 MED ORDER — 0.9 % SODIUM CHLORIDE (POUR BTL) OPTIME
TOPICAL | Status: DC | PRN
  Administered 2013-12-15: 500 mL

## 2013-12-15 MED ORDER — FENTANYL CITRATE 0.05 MG/ML IJ SOLN
INTRAMUSCULAR | Status: AC
  Filled 2013-12-15: qty 5

## 2013-12-15 MED ORDER — LACTATED RINGERS IV SOLN
INTRAVENOUS | Status: DC
  Administered 2013-12-15: 07:00:00 via INTRAVENOUS

## 2013-12-15 MED ORDER — GLYCOPYRROLATE 0.2 MG/ML IJ SOLN
INTRAMUSCULAR | Status: AC
  Filled 2013-12-15: qty 2

## 2013-12-15 MED ORDER — PROPOFOL 10 MG/ML IV EMUL
INTRAVENOUS | Status: AC
  Filled 2013-12-15: qty 20

## 2013-12-15 MED ORDER — LABETALOL HCL 5 MG/ML IV SOLN
5.0000 mg | INTRAVENOUS | Status: DC | PRN
  Administered 2013-12-15: 5 mg via INTRAVENOUS

## 2013-12-15 MED ORDER — BUPIVACAINE-EPINEPHRINE PF 0.5-1:200000 % IJ SOLN
INTRAMUSCULAR | Status: AC
  Filled 2013-12-15: qty 10

## 2013-12-15 MED ORDER — CEFAZOLIN SODIUM-DEXTROSE 2-3 GM-% IV SOLR
INTRAVENOUS | Status: AC
  Filled 2013-12-15: qty 50

## 2013-12-15 MED ORDER — KETOROLAC TROMETHAMINE 30 MG/ML IJ SOLN: 30.0000 mg | Freq: Once | INTRAMUSCULAR | Status: DC

## 2013-12-15 MED ORDER — OXYCODONE-ACETAMINOPHEN 5-325 MG PO TABS: 1.0000 | ORAL_TABLET | ORAL | Status: DC | PRN

## 2013-12-15 MED ORDER — ROCURONIUM BROMIDE 50 MG/5ML IV SOLN
INTRAVENOUS | Status: AC
  Filled 2013-12-15: qty 1

## 2013-12-15 MED ORDER — KETOROLAC TROMETHAMINE 10 MG PO TABS: 10.0000 mg | ORAL_TABLET | Freq: Four times a day (QID) | ORAL | Status: DC | PRN

## 2013-12-15 MED ORDER — NEOSTIGMINE METHYLSULFATE 1 MG/ML IJ SOLN
INTRAMUSCULAR | Status: DC | PRN
  Administered 2013-12-15: 4 mg via INTRAVENOUS

## 2013-12-15 MED ORDER — SODIUM CHLORIDE 0.9 % IR SOLN
Status: DC | PRN
  Administered 2013-12-15 (×2): 1000 mL

## 2013-12-15 MED ORDER — LABETALOL HCL 5 MG/ML IV SOLN
INTRAVENOUS | Status: AC
  Filled 2013-12-15: qty 4

## 2013-12-15 MED ORDER — ARTIFICIAL TEARS OP OINT
TOPICAL_OINTMENT | OPHTHALMIC | Status: DC | PRN
  Administered 2013-12-15: 1 via OPHTHALMIC

## 2013-12-15 MED ORDER — ONDANSETRON HCL 4 MG/2ML IJ SOLN
INTRAMUSCULAR | Status: AC
  Filled 2013-12-15: qty 2

## 2013-12-15 MED ORDER — LACTATED RINGERS IV SOLN
INTRAVENOUS | Status: DC | PRN
  Administered 2013-12-15 (×2): via INTRAVENOUS

## 2013-12-15 MED ORDER — ONDANSETRON HCL 4 MG/2ML IJ SOLN
4.0000 mg | Freq: Once | INTRAMUSCULAR | Status: AC
  Administered 2013-12-15: 4 mg via INTRAVENOUS
  Filled 2013-12-15: qty 2

## 2013-12-15 MED ORDER — PROPOFOL 10 MG/ML IV BOLUS
INTRAVENOUS | Status: DC | PRN
  Administered 2013-12-15: 150 mg via INTRAVENOUS

## 2013-12-15 MED ORDER — GLYCOPYRROLATE 0.2 MG/ML IJ SOLN
INTRAMUSCULAR | Status: DC | PRN
  Administered 2013-12-15: 0.6 mg via INTRAVENOUS

## 2013-12-15 MED ORDER — FENTANYL CITRATE 0.05 MG/ML IJ SOLN
25.0000 ug | INTRAMUSCULAR | Status: DC | PRN
  Administered 2013-12-15 (×2): 50 ug via INTRAVENOUS

## 2013-12-15 MED ORDER — ROCURONIUM BROMIDE 100 MG/10ML IV SOLN
INTRAVENOUS | Status: DC | PRN
  Administered 2013-12-15 (×2): 10 mg via INTRAVENOUS
  Administered 2013-12-15: 20 mg via INTRAVENOUS

## 2013-12-15 MED ORDER — BUPIVACAINE-EPINEPHRINE 0.5% -1:200000 IJ SOLN
INTRAMUSCULAR | Status: DC | PRN
  Administered 2013-12-15: 20 mL

## 2013-12-15 MED ORDER — ONDANSETRON HCL 4 MG/2ML IJ SOLN: 4.0000 mg | Freq: Once | INTRAMUSCULAR | Status: DC | PRN

## 2013-12-15 MED ORDER — DEXTROSE 5 % IV SOLN
INTRAVENOUS | Status: DC | PRN
  Administered 2013-12-15: 500 mL via INTRAVENOUS

## 2013-12-15 MED ORDER — FENTANYL CITRATE 0.05 MG/ML IJ SOLN
INTRAMUSCULAR | Status: DC | PRN
  Administered 2013-12-15: 100 ug via INTRAVENOUS
  Administered 2013-12-15 (×4): 50 ug via INTRAVENOUS

## 2013-12-15 SURGICAL SUPPLY — 63 items
BAG DECANTER FOR FLEXI CONT (MISCELLANEOUS) ×6 IMPLANT
BAG HAMPER (MISCELLANEOUS) ×6 IMPLANT
BAG SPEC RTRVL LRG 6X4 10 (ENDOMECHANICALS)
BANDAGE STRIP 1X3 FLEXIBLE (GAUZE/BANDAGES/DRESSINGS) ×18 IMPLANT
BLADE SURG SZ11 CARB STEEL (BLADE) ×6 IMPLANT
CATH ROBINSON RED A/P 16FR (CATHETERS) ×4 IMPLANT
CATH THERMACHOICE III (CATHETERS) ×6 IMPLANT
CLOSURE STERI-STRIP 1/4X4 (GAUZE/BANDAGES/DRESSINGS) ×8 IMPLANT
CLOSURE WOUND 1/2 X4 (GAUZE/BANDAGES/DRESSINGS) ×2
CLOTH BEACON ORANGE TIMEOUT ST (SAFETY) ×6 IMPLANT
COVER LIGHT HANDLE STERIS (MISCELLANEOUS) ×12 IMPLANT
DECANTER SPIKE VIAL GLASS SM (MISCELLANEOUS) ×6 IMPLANT
DRAPE EENT ADH APERT 15X15 STR (DRAPES) ×2 IMPLANT
DRAPE EENT ADH APERT 31X51 STR (DRAPES) ×2 IMPLANT
DRSG TEGADERM 2-3/8X2-3/4 SM (GAUZE/BANDAGES/DRESSINGS) ×4 IMPLANT
DURAPREP 26ML APPLICATOR (WOUND CARE) ×8 IMPLANT
ELECT REM PT RETURN 9FT ADLT (ELECTROSURGICAL) ×6
ELECTRODE REM PT RTRN 9FT ADLT (ELECTROSURGICAL) ×4 IMPLANT
FILTER SMOKE EVAC LAPAROSHD (FILTER) ×6 IMPLANT
FORMALIN 10 PREFIL 120ML (MISCELLANEOUS) ×6 IMPLANT
GLOVE BIOGEL PI IND STRL 9 (GLOVE) ×8 IMPLANT
GLOVE BIOGEL PI INDICATOR 9 (GLOVE) ×4
GLOVE ECLIPSE 6.5 STRL STRAW (GLOVE) ×2 IMPLANT
GLOVE ECLIPSE 8.0 STRL XLNG CF (GLOVE) ×2 IMPLANT
GLOVE ECLIPSE 9.0 STRL (GLOVE) ×6 IMPLANT
GLOVE INDICATOR 7.0 STRL GRN (GLOVE) ×2 IMPLANT
GLOVE INDICATOR 8.5 STRL (GLOVE) ×2 IMPLANT
GOWN SPEC L3 XXLG W/TWL (GOWN DISPOSABLE) ×10 IMPLANT
GOWN STRL REUS W/TWL LRG LVL3 (GOWN DISPOSABLE) ×12 IMPLANT
INST SET HYSTEROSCOPY (KITS) ×6 IMPLANT
INST SET LAPROSCOPIC GYN AP (KITS) ×6 IMPLANT
IV D5W 500ML (IV SOLUTION) ×6 IMPLANT
IV NS 1000ML (IV SOLUTION) ×18
IV NS 1000ML BAXH (IV SOLUTION) ×4 IMPLANT
KIT ROOM TURNOVER AP CYSTO (KITS) ×6 IMPLANT
KIT ROOM TURNOVER APOR (KITS) ×6 IMPLANT
LIGASURE 5MM LAPAROSCOPIC (INSTRUMENTS) ×2 IMPLANT
MANIFOLD NEPTUNE II (INSTRUMENTS) ×6 IMPLANT
NDL HYPO 25X1 1.5 SAFETY (NEEDLE) ×4 IMPLANT
NEEDLE HYPO 25X1 1.5 SAFETY (NEEDLE) ×6 IMPLANT
NEEDLE INSUFFLATION 120MM (ENDOMECHANICALS) ×6 IMPLANT
NS IRRIG 1000ML POUR BTL (IV SOLUTION) ×6 IMPLANT
PACK PERI GYN (CUSTOM PROCEDURE TRAY) ×6 IMPLANT
PAD ARMBOARD 7.5X6 YLW CONV (MISCELLANEOUS) ×6 IMPLANT
PAD TELFA 3X4 1S STER (GAUZE/BANDAGES/DRESSINGS) ×6 IMPLANT
POUCH SPECIMEN RETRIEVAL 10MM (ENDOMECHANICALS) IMPLANT
SCALPEL HARMONIC ACE (MISCELLANEOUS) IMPLANT
SET BASIN LINEN APH (SET/KITS/TRAYS/PACK) ×6 IMPLANT
SET IRRIG TUBING LAPAROSCOPIC (IRRIGATION / IRRIGATOR) ×2 IMPLANT
SET IRRIG Y TYPE TUR BLADDER L (SET/KITS/TRAYS/PACK) ×6 IMPLANT
SOLUTION ANTI FOG 6CC (MISCELLANEOUS) ×6 IMPLANT
STRIP CLOSURE SKIN 1/2X4 (GAUZE/BANDAGES/DRESSINGS) ×6 IMPLANT
SUT VIC AB 4-0 PS2 27 (SUTURE) ×6 IMPLANT
SUT VICRYL 0 UR6 27IN ABS (SUTURE) ×6 IMPLANT
SYR BULB IRRIGATION 50ML (SYRINGE) ×6 IMPLANT
SYR CONTROL 10ML LL (SYRINGE) ×6 IMPLANT
SYRINGE 10CC LL (SYRINGE) ×6 IMPLANT
TRAY FOLEY CATH 16FR SILVER (SET/KITS/TRAYS/PACK) ×2 IMPLANT
TROCAR ENDO BLADELESS 11MM (ENDOMECHANICALS) ×6 IMPLANT
TROCAR XCEL NON-BLD 5MMX100MML (ENDOMECHANICALS) ×6 IMPLANT
TUBING INSUFFLATION (TUBING) ×6 IMPLANT
WARMER LAPAROSCOPE (MISCELLANEOUS) ×6 IMPLANT
WATER STERILE IRR 1000ML POUR (IV SOLUTION) ×6 IMPLANT

## 2013-12-15 NOTE — Anesthesia Postprocedure Evaluation (Signed)
  Anesthesia Post-op Note  Patient: [name] [name]  Procedure(s) Performed: Procedure(s) with comments: EXCISION OF SKIN TAG (Right) LAPAROSCOPIC BILATERAL SALPINGECTOMY (Bilateral) DILATATION & CURETTAGE/HYSTEROSCOPY WITH THERMACHOICE ABLATION (N/A) - total therapy time:48min 1sec D5W  70ml in, D5W  61ml out Temperature 87 degree c. REMOVAL OF NON VAGINAL CONTRACEPTIVE DEVICE-IMPLANON (Left)  Patient Location: PACU  Anesthesia Type:General  Level of Consciousness: awake, alert , oriented and patient cooperative  Airway and Oxygen Therapy: Patient Spontanous Breathing and Patient connected to face mask oxygen  Post-op Pain: none  Post-op Assessment: Post-op Vital signs reviewed, Patient's Cardiovascular Status Stable, Respiratory Function Stable, Patent Airway and No signs of Nausea or vomiting  Post-op Vital Signs: Reviewed and stable  Last Vitals:  Filed Vitals:   12/15/13 0925  BP:   Pulse: 96  Temp: 36.7 C  Resp: 16    Complications: No apparent anesthesia complications

## 2013-12-15 NOTE — Discharge Instructions (Signed)
Laparoscopic Tubal Ligation °Care After °Refer to this sheet in the next few weeks. These instructions provide you with information on caring for yourself after your procedure. Your caregiver may also give you more specific instructions. Your treatment has been planned according to current medical practices, but problems sometimes occur. Call your caregiver if you have any problems or questions after your procedure. °HOME CARE INSTRUCTIONS  °· Rest the remainder of the day. °· Only take over-the-counter or prescription medicines for pain, discomfort, or fever as directed by your caregiver. Do not take aspirin. It can cause bleeding. °· Gradually resume daily activities, diet, rest, driving, and work. °· Avoid sexual intercourse for 2 weeks or as directed. °· Do not use tampons or douche. °· Do not drive while taking pain medicine. °· Do not lift anything over 5 pounds for 2 weeks or as directed. °· Only take showers, not baths, until you are seen by your caregiver. °· Change bandages (dressings) as directed. °· Take your temperature twice a day and record it. °· Try to have help for the first 7 to 10 days for your household needs. °· Return to your caregiver to get your stitches (sutures) removed and for follow-up visits as directed. °SEEK MEDICAL CARE IF:  °· You have redness, swelling, or increasing pain in a wound. °· You have drainage from a wound lasting longer than 1 day. °· Your pain is getting worse. °· You have a rash. °· You become dizzy or lightheaded. °· You have a reaction to your medicine. °· You need stronger medicine or a change in your pain medicine. °· You notice a bad smell coming from a wound or dressing. °· Your wound breaks open after the sutures have been removed. °· You are constipated. °SEEK IMMEDIATE MEDICAL CARE IF:  °· You faint. °· You have a fever. °· You have increasing abdominal pain. °· You have severe pain in your shoulders. °· You have bleeding or drainage from the suture sites or  vagina following surgery. °· You have shortness of breath or difficulty breathing. °· You have chest or leg pain. °· You have persistent nausea, vomiting, or diarrhea. °MAKE SURE YOU:  °· Understand these instructions. °· Watch your condition. °· Get help right away if you are not doing well or get worse. °Document Released: 03/02/2005 Document Revised: 02/12/2012 Document Reviewed: 11/24/2011 °[employer]® Patient Information ©2014 [employer]. ° °

## 2013-12-15 NOTE — Brief Op Note (Signed)
12/15/2013  9:23 AM  PATIENT:  [name] [name]  43 y.o. female  PRE-OPERATIVE DIAGNOSIS:  DYSFUNCTIONAL BLEEDING  STERILIZATION REQUEST,  SKIN TAG  POST-OPERATIVE DIAGNOSIS:  DYSFUNCTIONAL BLEEDING STERILIZATION REQUEST SKIN TAG  PROCEDURE:  Procedure(s) with comments: EXCISION OF SKIN TAG (Right) LAPAROSCOPIC BILATERAL SALPINGECTOMY (Bilateral) DILATATION & CURETTAGE/HYSTEROSCOPY WITH THERMACHOICE ABLATION (N/A) - total therapy time:59min 1sec D5W  50ml in, D5W  79ml out Temperature 87 degree c. REMOVAL OF NON VAGINAL CONTRACEPTIVE DEVICE-IMPLANON (Left)  SURGEON:  Surgeon(s) and Role:    * Jonnie Kind, MD - Primary  PHYSICIAN ASSISTANT:   ASSISTANTS: none   ANESTHESIA:   general and paracervical block  EBL:  Total I/O In: 1700 [I.V.:1700] Out: 70 [Urine:50; Blood:20]  BLOOD ADMINISTERED:none  DRAINS: none   LOCAL MEDICATIONS USED:  MARCAINE    and Amount: 20 ml  SPECIMEN:  Source of Specimen:  endometrial curetting, bilateral fallopian tubes.  DISPOSITION OF SPECIMEN:  PATHOLOGY  COUNTS:  YES  TOURNIQUET:  * No tourniquets in log *  DICTATION: .Dragon Dictation  PLAN OF CARE: Discharge to home after PACU  PATIENT DISPOSITION:  PACU - hemodynamically stable.   Delay start of Pharmacological VTE agent (>24hrs) due to surgical blood loss or risk of bleeding: not applicable

## 2013-12-15 NOTE — Anesthesia Preprocedure Evaluation (Signed)
Anesthesia Evaluation  Patient identified by MRN, date of birth, ID band Patient awake    Reviewed: Allergy & Precautions, H&P , NPO status   Airway Mallampati: I TM Distance: >3 FB Neck ROM: Full    Dental  (+) Teeth Intact   Pulmonary former smoker,  breath sounds clear to auscultation        Cardiovascular hypertension (no meds), Rhythm:Regular Rate:Normal     Neuro/Psych  Headaches, PSYCHIATRIC DISORDERS Depression    GI/Hepatic GERD-  Controlled and Medicated,  Endo/Other    Renal/GU Renal disease     Musculoskeletal   Abdominal   Peds  Hematology   Anesthesia Other Findings   Reproductive/Obstetrics                           Anesthesia Physical Anesthesia Plan  ASA: II  Anesthesia Plan: General   Post-op Pain Management:    Induction: Intravenous, Rapid sequence and Cricoid pressure planned  Airway Management Planned: Oral ETT  Additional Equipment:   Intra-op Plan:   Post-operative Plan: Extubation in OR  Informed Consent: I have reviewed the patients History and Physical, chart, labs and discussed the procedure including the risks, benefits and alternatives for the proposed anesthesia with the patient or authorized representative who has indicated his/her understanding and acceptance.     Plan Discussed with:   Anesthesia Plan Comments:         Anesthesia Quick Evaluation

## 2013-12-15 NOTE — Transfer of Care (Signed)
Immediate Anesthesia Transfer of Care Note  Patient: MYKENNA VIELE  Procedure(s) Performed: Procedure(s) with comments: EXCISION OF SKIN TAG (Right) LAPAROSCOPIC BILATERAL SALPINGECTOMY (Bilateral) DILATATION & CURETTAGE/HYSTEROSCOPY WITH THERMACHOICE ABLATION (N/A) - total therapy time:60min 1sec D5W  51ml in, D5W  29ml out Temperature 87 degree c. REMOVAL OF NON VAGINAL CONTRACEPTIVE DEVICE-IMPLANON (Left)  Patient Location: PACU  Anesthesia Type:General  Level of Consciousness: awake, alert , oriented and patient cooperative  Airway & Oxygen Therapy: Patient Spontanous Breathing and Patient connected to face mask oxygen  Post-op Assessment: Report given to PACU RN and Post -op Vital signs reviewed and stable  Post vital signs: Reviewed and stable  Complications: No apparent anesthesia complications

## 2013-12-15 NOTE — Op Note (Signed)
12/15/2013  9:23 AM  PATIENT:  [name] [name]  43 y.o. female  PRE-OPERATIVE DIAGNOSIS:  DYSFUNCTIONAL BLEEDING  STERILIZATION REQUEST,  SKIN TAG  POST-OPERATIVE DIAGNOSIS:  DYSFUNCTIONAL BLEEDING STERILIZATION REQUEST SKIN TAG  PROCEDURE:  Procedure(s) with comments: EXCISION OF SKIN TAG (Right) LAPAROSCOPIC BILATERAL SALPINGECTOMY (Bilateral) DILATATION & CURETTAGE/HYSTEROSCOPY WITH THERMACHOICE ABLATION (N/A) - total therapy time:48min 1sec D5W  78ml in, D5W  71ml out Temperature 87 degree c. REMOVAL OF NON VAGINAL CONTRACEPTIVE DEVICE-IMPLANON (Left)  SURGEON:  Surgeon(s) and Role:    * Jonnie Kind, MD - Primary  Indications; 43 year old female requesting permanent sterilization with removal of Nexplanon, with endometrial ablation performed as concurrent procedure to avoid return of heavy menses. Details of procedure: Patient was taken operating room prepped and draped for combined abdominal and vaginal procedure. Timeout was conducted. Ancef administered 2 g intravenous. Attention was first directed to pelvis, speculum inserted and cervix grasped with single-tooth tenaculum comments that in the retroverted position, and the uterine manipulator carefully positioned, rotated anteriorly and the anterior lip of the cervix grasped with the uterine manipulator, a Hulka tenaculum. The skin tag was sharply excised off the right side. , Inspected and discarded. Attention was then directed the abdomen where an infraumbilical vertical 1 cm skin incision was made as well as a transverse direction right lower quadrant skin incision. Veress needle was introduced through the umbilicus, and water droplet technique used to identify intraperitoneal location. CO2 x2 L was introduced under 7 mm intra-abdominal pressure, and then the laparoscopic trocar was inserted under direct visualization through the umbilical site. Pubic and right lower quadrant 5 mm trochars were placed under direct  visualization through the camera Attention was then directed to the uterus. Uterine manipulation was gently performed. The uterine contours were inspected and there was no evidence of uterine perforation. Attention was first directed to the patient's right fallopian tube which could be identified to its fimbriated end with the. The fissure was then used to transect the tube at the area of the fallopian tube cornu, and the tube was removed by serially coagulating across the mesosalpinx, being particularly careful to stay away from any adjacent pelvic structures, and the tube was extracted through the right lower quadrant trocar site. The left fallopian tube was treated in similar fashion but there was and then adhesions around the ovary and tube at the level of the fimbria on the left side. Therefore the body of the fallopian tube could be removed the small portion of the distal fimbria could not be extracted it were left in situ, less than 1 cm of the fallopian tube remnant hemostasis was again and again the uterus appeared normal. Left the uterine manipulator was removed, and the abdomen deflated, 120 cc of saline instilled into the abdominal cavity and then scopic trochars carefully removed being careful to remove this much of the carbon dioxide is possible good results S. retractors were then used at the umbilical site to identify the fascia which was closed with 0 Vicryl x1 stitch, then the subcuticular closure of all 3 port sites with 4-0 Vicryl was performed. Steri-Strips were applied. Hysteroscopy: Physician moved to the vaginal position speculum was reinserted cervix grasped with single-tooth tenaculum, sounded without difficulty to 8.5, dilated to 23 [place], and then the 30 hysteroscope inserted and the uterine cavity visualized. There was a small amount of clot, attributable to the uterine manipulator, but no continued bleeding uterine contour was smoothly curetted and only a tiny bit of tissue  removed.  Essentially the endometrial cavity was atrophic as expected with the Nexplanon. The Gynecare ThermaChoice 3 endometrial ablation device was then prepared and inserted 8 cm into the uterine cavity, inflated 17 cc of D5W and the 8 minute thermal ablation sequence completed without difficulty all 17 cc were recovered in the 9 minute 1 second procedure paracervical block was then injected using 20 cc of Marcaine with epinephrine in a paracervical injection at 357 and 9:00. The procedure was considered completed.  Nexplanon removal the gloves were changed, the arm prepped, the left arm, as previously identified. The Nexplanon was palpated and the distal tip easily identified and the skin. A tiny 2 mm to 3 mm incision was made through the skin and the Nexplanon tip could be extruded into the skin opening, and the capsule over the Nexplanon sharply disrupted with #11 blade on the tip of the implant, then the implant was removed and measured as intact at 4 cm length Steri-Strips were applied to the arm incision as stated and then to the umbilicus and abdominal incisions previously. Patient went to recovery in stable condition sponge and needle counts correct EBL 20 cc

## 2013-12-15 NOTE — Anesthesia Procedure Notes (Signed)
Procedure Name: Intubation Date/Time: 12/15/2013 7:33 AM Performed by: Andree Elk, Aaren Krog A Pre-anesthesia Checklist: Patient identified, Patient being monitored, Timeout performed, Emergency Drugs available and Suction available Patient Re-evaluated:Patient Re-evaluated prior to inductionOxygen Delivery Method: Circle System Utilized Preoxygenation: Pre-oxygenation with 100% oxygen Intubation Type: IV induction, Rapid sequence and Cricoid Pressure applied Ventilation: Mask ventilation without difficulty Laryngoscope Size: 3 and Miller Grade View: Grade I Tube type: Oral Tube size: 7.0 mm Number of attempts: 1 Airway Equipment and Method: stylet Placement Confirmation: ETT inserted through vocal cords under direct vision,  positive ETCO2 and breath sounds checked- equal and bilateral Secured at: 21 cm Tube secured with: Tape Dental Injury: Teeth and Oropharynx as per pre-operative assessment

## 2013-12-15 NOTE — H&P (Signed)
[name] [name] is an 43 y.o. female. She is admitted for permanent sterilization by bilateral salpingectomy, with removal of nexplanon. She will also have hysteroscopy, Dilation and curettage, with endometrial ablation for menstrual control.  She has had ultrasound showing a thin endometrium, 2.8 mm maximum thickness, and curettage sampling to be sent at time of D& C. She also has a small right thigh skin tag she requests to be removed, noted at right thigh panty line,   She has been counselled re: sterilization options, including alternatives such as continued Nexplanon use, and after discussion of the current recommendation of salpingectomy for future cancer risk reduction as well as permanent sterilization,, she selects this option  Pertinent Gynecological History: Menses: absent , controlled by nexplanon ,due for removal Bleeding: none Contraception: Nexplanon desiring permanent sterilization DES exposure: unknown Blood transfusions: none Sexually transmitted diseases: no past history Previous GYN Procedures: ultrasound:  Small uterus, 7.4 cm length. With small uterine fibroids noted , none above 2.4 cm intramural, felt to be stable at present. Last mammogram: normal Date: 09/2012, done at [facility] Last pap: normal Date: normal, 08/2012 OB History: G2, P2   Menstrual History: Menarche age:  No LMP recorded.    Past Medical History  Diagnosis Date  . Hyperlipidemia     x4 years   . Abnormal vaginal Pap smear     Dr Glo Herring  . Allergic rhinitis   . Kidney stones   . Hypertension     whenshe wasa young but she changed diet and reducd salt intake   . Vaginal Pap smear, abnormal   . Fibroids 10/28/2013  . Irregular intermenstrual bleeding 10/28/2013  . Unspecified symptom associated with female genital organs 10/28/2013    Past Surgical History  Procedure Laterality Date  . Cervical biopsy  2010    for abnormal pap   . Cyst removed from both breast , all benign both  breast    . Breast reduction surgery  Jan 2007    Family History  Problem Relation Age of Onset  . Fibromyalgia Mother   . Hypertension Mother   . Thyroid disease Mother   . Arthritis Mother   . Alcohol abuse Father   . COPD Father     Social History:  reports that she quit smoking about 4 years ago. Her smoking use included Cigarettes. She has a 4 pack-year smoking history. She has never used smokeless tobacco. She reports that she drinks alcohol. She reports that she does not use illicit drugs.  Allergies: No Known Allergies  No prescriptions prior to admission    ROS essentially negative.  There were no vitals taken for this visit. Physical Exam PHYSICAL EXAM:  Blood pressure 138/90, height 5\' 1"  (1.549 m), weight 134 lb 12.8 oz (61.145 kg).  General appearance - alert, well appearing, and in no distress  Chest - clear to auscultation, no wheezes, rales or rhonchi, symmetric air entry  Heart - normal rate and regular rhythm  Abdomen - soft, nontender, nondistended, no masses or organomegaly  Pelvic - Chaperone present for exam which was performed with pt's permission. Uterus is irregular, retroverted, 8 week size  Extremities - peripheral pulses normal, no pedal edema, no clubbing or cyanosis    CBC    Component Value Date/Time   WBC 6.4 12/11/2013 0920   RBC 4.36 12/11/2013 0920   HGB 13.1 12/11/2013 0920   HCT 38.2 12/11/2013 0920   PLT 372 12/11/2013 0920   MCV 87.6 12/11/2013 0920  MCH 30.0 12/11/2013 0920   MCHC 34.3 12/11/2013 0920   RDW 13.9 12/11/2013 0920   LYMPHSABS 3.1 12/19/2010 1047   MONOABS 0.7 12/19/2010 1047   EOSABS 0.1 12/19/2010 1047   BASOSABS 0.1 12/19/2010 1047   preg test Negative    No results found for this or any previous visit (from the past 24 hour(s)).  No results found.  Assessment/Plan: 1. Desire for permanent sterilization , by bilateral salpingectomy 2. Desire for endometrial ablation for menstrual control. Thin endometrium 2.8 mm  confirmed. 3. Right thigh skin tag , for removal 4. Stable uterine fibroids,  Plan. Laparoscopic bilateral salpingectomy, hysteroscopy, dilation and curettage, with endometrial ablation, also removal of right thigh skin tag. Technical aspects of procedure reviewed in detail, including risks inherent in surgery, such as bleeding , infection, need for additional surgeries, injury to adjacent organs. Instructional brochures used as part of education process.   Jonnie Kind 12/15/2013, 6:04 AM

## 2013-12-16 ENCOUNTER — Telehealth: Payer: Self-pay | Admitting: Obstetrics and Gynecology

## 2013-12-16 ENCOUNTER — Encounter (HOSPITAL_COMMUNITY): Payer: Self-pay | Admitting: Obstetrics and Gynecology

## 2013-12-16 NOTE — Telephone Encounter (Signed)
Pt states can she take a shower today and what does she need to do about bandages on her stomach. Pt had surgery with Dr. Glo Herring yesterday. Per Dr. Glo Herring pt can remove bandages take shower and replace with band-aids. Pt had no other c/o at this time.

## 2013-12-22 ENCOUNTER — Encounter: Payer: PRIVATE HEALTH INSURANCE | Admitting: Obstetrics and Gynecology

## 2013-12-28 ENCOUNTER — Encounter: Payer: PRIVATE HEALTH INSURANCE | Admitting: Obstetrics and Gynecology

## 2013-12-30 ENCOUNTER — Ambulatory Visit (INDEPENDENT_AMBULATORY_CARE_PROVIDER_SITE_OTHER): Payer: PRIVATE HEALTH INSURANCE | Admitting: Obstetrics and Gynecology

## 2013-12-30 ENCOUNTER — Encounter: Payer: Self-pay | Admitting: Obstetrics and Gynecology

## 2013-12-30 VITALS — BP 100/60 | Ht 61.0 in | Wt 134.0 lb

## 2013-12-30 DIAGNOSIS — Z9889 Other specified postprocedural states: Secondary | ICD-10-CM

## 2013-12-30 NOTE — Progress Notes (Deleted)
This note was scribed for [name] Shirk, MD, by Jenne Campus, Medical Scribe on 12/30/13. The information in this note was reviewed by [name] Shirk, MD, and is accurate.  Subjective:  [name] [name] is a 43 y.o. female who presents to the clinic 1 week status post  EXCISION OF SKIN TAG (Right)  LAPAROSCOPIC BILATERAL SALPINGECTOMY (Bilateral)  DILATATION & CURETTAGE/HYSTEROSCOPY WITH THERMACHOICE ABLATION   Review of Systems Negative except watery discharge normal s/p ablation   She has been eating a regular diet without difficulty.   Bowel movements are normal. The patient is not having any pain.  Objective:  BP 100/60   Ht 5\' 1"  (1.549 m)   Wt 134 lb (60.782 kg)   BMI 25.33 kg/m2   LMP 12/11/2013 Chaperone present for exam which was performed with pt's permission General:Well developed, well nourished.  No acute distress. Abdomen: Bowel sounds normal, soft, non-tender. Pelvic Exam:    External Genitalia:  Normal    Vagina:  normal s/p ablation discharge      Cervix: Normal     Incision(s):   Healing well, no drainage, no erythema, no hernia, no swelling, no dehiscence, incision well approximated.   Assessment:  Post-Op 1 weeks s/p EXCISION OF SKIN TAG (Right)  LAPAROSCOPIC BILATERAL SALPINGECTOMY (Bilateral)  DILATATION & CURETTAGE/HYSTEROSCOPY WITH THERMACHOICE ABLATION   Doing well postoperatively.   Plan:  1.Wound care discussed   2. Continue any current medications. 3. Activity restrictions: none 4. return to work: not applicable. 5. Follow up PRN  *note deleted due to improper saving/sharing

## 2014-01-07 NOTE — Progress Notes (Signed)
This note was scribed for [name] Shirk, MD, by Jenne Campus, Medical Scribe on 12/30/13. The information in this note was reviewed by [name] Shirk, MD, and is accurate.  Subjective:  [name] [name] is a 43 y.o. female who presents to the clinic 1 week status post  EXCISION OF SKIN TAG (Right)  LAPAROSCOPIC BILATERAL SALPINGECTOMY (Bilateral)  DILATATION & CURETTAGE/HYSTEROSCOPY WITH THERMACHOICE ABLATION   Review of Systems Negative except watery discharge normal s/p ablation   She has been eating a regular diet without difficulty.   Bowel movements are normal. The patient is not having any pain.  Objective:  BP 100/60  Ht 5\' 1"  (1.549 m)  Wt 134 lb (60.782 kg)  BMI 25.33 kg/m2  LMP 12/11/2013 Chaperone present for exam which was performed with pt's permission General:Well developed, well nourished.  No acute distress. Abdomen: Bowel sounds normal, soft, non-tender. Pelvic Exam:    External Genitalia:  Normal    Vagina:  normal s/p ablation discharge      Cervix: Normal     Incision(s):   Healing well, no drainage, no erythema, no hernia, no swelling, no dehiscence, incision well approximated.   Assessment:  Post-Op 1 weeks s/p EXCISION OF SKIN TAG (Right)  LAPAROSCOPIC BILATERAL SALPINGECTOMY (Bilateral)  DILATATION & CURETTAGE/HYSTEROSCOPY WITH THERMACHOICE ABLATION   Doing well postoperatively.   Plan:  1.Wound care discussed   2. Continue any current medications. 3. Activity restrictions: none 4. return to work: not applicable. 5. Follow up PRN  *note recreated due to improper saving/sharing

## 2014-02-11 ENCOUNTER — Other Ambulatory Visit: Payer: Self-pay | Admitting: Obstetrics and Gynecology

## 2014-03-15 ENCOUNTER — Ambulatory Visit: Payer: PRIVATE HEALTH INSURANCE | Admitting: Obstetrics and Gynecology

## 2014-03-18 ENCOUNTER — Ambulatory Visit: Payer: PRIVATE HEALTH INSURANCE | Admitting: Family Medicine

## 2014-04-08 ENCOUNTER — Other Ambulatory Visit (HOSPITAL_COMMUNITY)
Admission: RE | Admit: 2014-04-08 | Discharge: 2014-04-08 | Disposition: A | Discharge status: Home / Self Care | Payer: PRIVATE HEALTH INSURANCE | Source: Ambulatory Visit | Attending: Family Medicine | Admitting: Family Medicine

## 2014-04-08 ENCOUNTER — Ambulatory Visit (INDEPENDENT_AMBULATORY_CARE_PROVIDER_SITE_OTHER): Payer: PRIVATE HEALTH INSURANCE | Admitting: Family Medicine

## 2014-04-08 ENCOUNTER — Encounter: Payer: Self-pay | Admitting: Family Medicine

## 2014-04-08 VITALS — BP 150/98 | HR 81 | Resp 16 | Ht 61.0 in | Wt 140.0 lb

## 2014-04-08 DIAGNOSIS — F411 Generalized anxiety disorder: Secondary | ICD-10-CM

## 2014-04-08 DIAGNOSIS — E785 Hyperlipidemia, unspecified: Secondary | ICD-10-CM

## 2014-04-08 DIAGNOSIS — Z113 Encounter for screening for infections with a predominantly sexual mode of transmission: Secondary | ICD-10-CM | POA: Insufficient documentation

## 2014-04-08 DIAGNOSIS — N760 Acute vaginitis: Secondary | ICD-10-CM | POA: Diagnosis present

## 2014-04-08 DIAGNOSIS — B359 Dermatophytosis, unspecified: Secondary | ICD-10-CM | POA: Insufficient documentation

## 2014-04-08 DIAGNOSIS — B354 Tinea corporis: Secondary | ICD-10-CM

## 2014-04-08 DIAGNOSIS — F329 Major depressive disorder, single episode, unspecified: Secondary | ICD-10-CM

## 2014-04-08 DIAGNOSIS — F32A Depression, unspecified: Secondary | ICD-10-CM

## 2014-04-08 DIAGNOSIS — F3289 Other specified depressive episodes: Secondary | ICD-10-CM

## 2014-04-08 DIAGNOSIS — I10 Essential (primary) hypertension: Secondary | ICD-10-CM

## 2014-04-08 MED ORDER — TRIAMTERENE-HCTZ 37.5-25 MG PO TABS: 1.0000 | ORAL_TABLET | Freq: Every day | ORAL | Status: DC

## 2014-04-08 MED ORDER — CLOTRIMAZOLE-BETAMETHASONE 1-0.05 % EX CREA: TOPICAL_CREAM | CUTANEOUS | Status: DC

## 2014-04-08 MED ORDER — BUSPIRONE HCL 7.5 MG PO TABS: ORAL_TABLET | ORAL | Status: DC

## 2014-04-08 NOTE — Assessment & Plan Note (Signed)
right buttock lesion, itching for weeks, circular

## 2014-04-08 NOTE — Patient Instructions (Addendum)
F/u first or second week in October, call if you need me before  You will be contacted with results of tests today  New for Blood pressure is triamterene one daily  New dose of anxiety medication is buspar 7.5mg  one twice dialt  New medication for fungal rash is sent in  Fasting chem 7 and lipid in October approx 3 days before f/u

## 2014-04-11 DIAGNOSIS — N760 Acute vaginitis: Secondary | ICD-10-CM | POA: Insufficient documentation

## 2014-04-11 DIAGNOSIS — B354 Tinea corporis: Secondary | ICD-10-CM | POA: Insufficient documentation

## 2014-04-11 NOTE — Assessment & Plan Note (Signed)
Hyperlipidemia:Low fat diet discussed and encouraged.  Updated lab needed at/ before next visit.  

## 2014-04-11 NOTE — Assessment & Plan Note (Signed)
Uncontrolled, has had elevated BP in the past , but inconsistently Start medication DASH diet and commitment to daily physical activity for a minimum of 30 minutes discussed and encouraged, as a part of hypertension management. The importance of attaining a healthy weight is also discussed.

## 2014-04-11 NOTE — Assessment & Plan Note (Signed)
Improved and controlled on med

## 2014-04-11 NOTE — Assessment & Plan Note (Signed)
Improved on med, forgets 3rd dose , inconvenient , will change to twice daily dosing  Reminded her of stress relaxation techniques also
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
## 2014-04-11 NOTE — Assessment & Plan Note (Signed)
Pruritic rash on right buttock, med sent in

## 2014-04-11 NOTE — Assessment & Plan Note (Signed)
Specimens sent will treat when results are available.

## 2014-04-12 MED ORDER — METRONIDAZOLE 500 MG PO TABS: 500.0000 mg | ORAL_TABLET | Freq: Two times a day (BID) | ORAL | Status: DC

## 2014-04-12 NOTE — Addendum Note (Signed)
Addended by: Denman George B on: 04/12/2014 10:13 AM   Modules accepted: Orders

## 2014-04-30 ENCOUNTER — Ambulatory Visit: Payer: PRIVATE HEALTH INSURANCE | Admitting: Obstetrics and Gynecology

## 2014-05-13 ENCOUNTER — Other Ambulatory Visit: Payer: Self-pay | Admitting: Family Medicine

## 2014-05-27 ENCOUNTER — Ambulatory Visit: Payer: PRIVATE HEALTH INSURANCE | Admitting: Family Medicine

## 2014-06-11 ENCOUNTER — Other Ambulatory Visit: Payer: Self-pay

## 2014-06-15 ENCOUNTER — Encounter: Payer: Self-pay | Admitting: Women's Health

## 2014-06-15 ENCOUNTER — Ambulatory Visit (INDEPENDENT_AMBULATORY_CARE_PROVIDER_SITE_OTHER): Payer: PRIVATE HEALTH INSURANCE | Admitting: Women's Health

## 2014-06-15 VITALS — BP 128/80 | Ht 61.0 in | Wt 136.5 lb

## 2014-06-15 DIAGNOSIS — B9689 Other specified bacterial agents as the cause of diseases classified elsewhere: Secondary | ICD-10-CM | POA: Insufficient documentation

## 2014-06-15 DIAGNOSIS — N898 Other specified noninflammatory disorders of vagina: Secondary | ICD-10-CM

## 2014-06-15 DIAGNOSIS — N760 Acute vaginitis: Secondary | ICD-10-CM

## 2014-06-15 DIAGNOSIS — A499 Bacterial infection, unspecified: Secondary | ICD-10-CM

## 2014-06-15 LAB — POCT WET PREP (WET MOUNT): Clue Cells Wet Prep Whiff POC: POSITIVE

## 2014-06-15 MED ORDER — METRONIDAZOLE 500 MG PO TABS: 500.0000 mg | ORAL_TABLET | Freq: Two times a day (BID) | ORAL | Status: DC

## 2014-06-15 NOTE — Patient Instructions (Signed)
Call Dr. Moshe Cipro to schedule your pap smear and physical  No alcohol while on flagyl

## 2014-06-15 NOTE — Progress Notes (Signed)
Patient ID: [name] [name], female   DOB: May 08, 1971, 43 y.o.   MRN: [id number]   [facility] Visit  Patient name: [name] [name] MRN [id number]  Date of birth: July 02, 1971  CC & HPI:  [name] [name] is a 43 y.o. African American female presenting today for report of heavier vaginal d/c x 1 week, noticed odor yesterday. No itching/irritation. Used different soap last week. Not currently sexually active.  Last pap Jan 2014 w/ Dr. Moshe Cipro: neg w/ +HRHPV.  Pertinent History Reviewed:  Medical & Surgical Hx:   Past Medical History  Diagnosis Date  . Hyperlipidemia     x4 years   . Abnormal vaginal Pap smear     Dr Glo Herring  . Allergic rhinitis   . Kidney stones   . Hypertension     whenshe wasa young but she changed diet and reducd salt intake   . Vaginal Pap smear, abnormal   . Fibroids 10/28/2013  . Irregular intermenstrual bleeding 10/28/2013  . Unspecified symptom associated with female genital organs 10/28/2013   Past Surgical History  Procedure Laterality Date  . Cervical biopsy  2010    for abnormal pap   . Cyst removed from both breast , all benign both breast    . Breast reduction surgery  Jan 2007  . Excision of skin tag Right 12/15/2013    Procedure: EXCISION OF SKIN TAG;  Surgeon: Jonnie Kind, MD;  Location: [facility];  Service: Gynecology;  Laterality: Right;  . Laparoscopic bilateral salpingectomy Bilateral 12/15/2013    Procedure: LAPAROSCOPIC BILATERAL SALPINGECTOMY;  Surgeon: Jonnie Kind, MD;  Location: [facility];  Service: Gynecology;  Laterality: Bilateral;  . Dilitation & currettage/hystroscopy with thermachoice ablation N/A 12/15/2013    Procedure: DILATATION & CURETTAGE/HYSTEROSCOPY WITH THERMACHOICE ABLATION;  Surgeon: Jonnie Kind, MD;  Location: [facility];  Service: Gynecology;  Laterality: N/A;  total therapy time:80min 1sec D5W  37ml in, D5W  52ml out Temperature 87 degree c.  . Removal of non vaginal contraceptive device Left 12/15/2013   Procedure: REMOVAL OF NON VAGINAL CONTRACEPTIVE DEVICE-IMPLANON;  Surgeon: Jonnie Kind, MD;  Location: [facility];  Service: Gynecology;  Laterality: Left;   Medications: Reviewed & Updated - see associated section Social History: Reviewed -  reports that she quit smoking about 5 years ago. Her smoking use included Cigarettes. She has a 4 pack-year smoking history. She has never used smokeless tobacco.  Objective Findings:  Vitals: BP 128/80  Ht 5\' 1"  (1.549 m)  Wt 136 lb 8 oz (61.916 kg)  BMI 25.80 kg/m2  Physical Examination: General appearance - alert, well appearing, and in no distress Pelvic - small amount thin white malodorous d/c  Results for orders placed in visit on 06/15/14 (from the past 24 hour(s))  POCT WET PREP (WET [place])   Collection Time    06/15/14  9:50 AM      Result Value Ref Range   Source Wet Prep POC vaginal     WBC, Wet Prep HPF POC few     Bacteria Wet Prep HPF POC none     BACTERIA WET PREP MORPHOLOGY POC       Clue Cells Wet Prep HPF POC Many     Clue Cells Wet Prep Whiff POC Positive Whiff     Yeast Wet Prep HPF POC None     KOH Wet Prep POC       Trichomonas Wet Prep HPF POC none  Assessment & Plan:  A:   BV  Past due for pap P:  Rx metronidazole 500mg  bid x 7d for bv, no etoh   Call Dr. Moshe Cipro asap to schedule pap & physical  F/U prn   Tawnya Crook CNM, [facility] 06/15/2014 9:51 AM

## 2014-06-28 ENCOUNTER — Encounter: Payer: Self-pay | Admitting: Women's Health

## 2014-09-02 ENCOUNTER — Other Ambulatory Visit: Payer: Self-pay | Admitting: Family Medicine

## 2014-10-13 ENCOUNTER — Other Ambulatory Visit: Payer: Self-pay | Admitting: Family Medicine

## 2014-12-01 ENCOUNTER — Other Ambulatory Visit (HOSPITAL_COMMUNITY)
Admission: RE | Admit: 2014-12-01 | Discharge: 2014-12-01 | Disposition: A | Discharge status: Home / Self Care | Payer: PRIVATE HEALTH INSURANCE | Source: Ambulatory Visit | Attending: Family Medicine | Admitting: Family Medicine

## 2014-12-01 ENCOUNTER — Encounter: Payer: Self-pay | Admitting: Family Medicine

## 2014-12-01 ENCOUNTER — Ambulatory Visit (INDEPENDENT_AMBULATORY_CARE_PROVIDER_SITE_OTHER): Payer: PRIVATE HEALTH INSURANCE | Admitting: Family Medicine

## 2014-12-01 VITALS — BP 110/74 | HR 93 | Resp 18 | Ht 61.0 in | Wt 141.0 lb

## 2014-12-01 DIAGNOSIS — Z113 Encounter for screening for infections with a predominantly sexual mode of transmission: Secondary | ICD-10-CM | POA: Insufficient documentation

## 2014-12-01 DIAGNOSIS — Z1211 Encounter for screening for malignant neoplasm of colon: Secondary | ICD-10-CM | POA: Diagnosis not present

## 2014-12-01 DIAGNOSIS — Z01411 Encounter for gynecological examination (general) (routine) with abnormal findings: Secondary | ICD-10-CM | POA: Diagnosis present

## 2014-12-01 DIAGNOSIS — N760 Acute vaginitis: Secondary | ICD-10-CM | POA: Insufficient documentation

## 2014-12-01 DIAGNOSIS — E785 Hyperlipidemia, unspecified: Secondary | ICD-10-CM

## 2014-12-01 DIAGNOSIS — F329 Major depressive disorder, single episode, unspecified: Secondary | ICD-10-CM

## 2014-12-01 DIAGNOSIS — F32A Depression, unspecified: Secondary | ICD-10-CM

## 2014-12-01 DIAGNOSIS — Z0000 Encounter for general adult medical examination without abnormal findings: Secondary | ICD-10-CM | POA: Diagnosis not present

## 2014-12-01 DIAGNOSIS — E559 Vitamin D deficiency, unspecified: Secondary | ICD-10-CM

## 2014-12-01 DIAGNOSIS — I10 Essential (primary) hypertension: Secondary | ICD-10-CM | POA: Diagnosis not present

## 2014-12-01 DIAGNOSIS — Z124 Encounter for screening for malignant neoplasm of cervix: Secondary | ICD-10-CM

## 2014-12-01 LAB — POC HEMOCCULT BLD/STL (OFFICE/1-CARD/DIAGNOSTIC): Fecal Occult Blood, POC: NEGATIVE

## 2014-12-01 MED ORDER — MOMETASONE FUROATE 50 MCG/ACT NA SUSP: 2.0000 | Freq: Every day | NASAL | Status: DC

## 2014-12-01 MED ORDER — FLUCONAZOLE 150 MG PO TABS: 150.0000 mg | ORAL_TABLET | Freq: Once | ORAL | Status: DC

## 2014-12-01 MED ORDER — FLUCONAZOLE 150 MG PO TABS: ORAL_TABLET | ORAL | Status: DC

## 2014-12-01 MED ORDER — MONTELUKAST SODIUM 10 MG PO TABS: 10.0000 mg | ORAL_TABLET | Freq: Every day | ORAL | Status: DC

## 2014-12-01 NOTE — Patient Instructions (Addendum)
F/U in 6 months, call if you need me before   Schedule and do mammogram past due  You are referred to Dr Harrington Challenger for mental health needs    CBC, fasting lipid, chem 7 , tSH and Vit D  as soon  as possible

## 2014-12-02 LAB — CBC
HCT: 42.1 % (ref 36.0–46.0)
Hemoglobin: 14.2 g/dL (ref 12.0–15.0)
MCH: 29.6 pg (ref 26.0–34.0)
MCHC: 33.7 g/dL (ref 30.0–36.0)
MCV: 87.7 fL (ref 78.0–100.0)
MPV: 8.5 fL — ABNORMAL LOW (ref 8.6–12.4)
Platelets: 475 10*3/uL — ABNORMAL HIGH (ref 150–400)
RBC: 4.8 MIL/uL (ref 3.87–5.11)
RDW: 14.3 % (ref 11.5–15.5)
WBC: 6.5 10*3/uL (ref 4.0–10.5)

## 2014-12-02 LAB — LIPID PANEL
Cholesterol: 250 mg/dL — ABNORMAL HIGH (ref 0–200)
HDL: 73 mg/dL (ref >=46)
LDL Cholesterol: 159 mg/dL — ABNORMAL HIGH (ref 0–99)
Total CHOL/HDL Ratio: 3.4 Ratio
Triglycerides: 89 mg/dL (ref <150)
VLDL: 18 mg/dL (ref 0–40)

## 2014-12-02 LAB — BASIC METABOLIC PANEL
BUN: 11 mg/dL (ref 6–23)
CO2: 27 mEq/L (ref 19–32)
Calcium: 9.2 mg/dL (ref 8.4–10.5)
Chloride: 98 mEq/L (ref 96–112)
Creat: 0.74 mg/dL (ref 0.50–1.10)
Glucose, Bld: 88 mg/dL (ref 70–99)
Potassium: 4.2 mEq/L (ref 3.5–5.3)
Sodium: 134 mEq/L — ABNORMAL LOW (ref 135–145)

## 2014-12-02 LAB — TSH: TSH: 2.866 u[IU]/mL (ref 0.350–4.500)

## 2014-12-03 LAB — CYTOLOGY - PAP

## 2014-12-03 LAB — VITAMIN D 25 HYDROXY (VIT D DEFICIENCY, FRACTURES): Vit D, 25-Hydroxy: 10 ng/mL — ABNORMAL LOW (ref 30–100)

## 2014-12-03 NOTE — Assessment & Plan Note (Signed)

## 2014-12-03 NOTE — Assessment & Plan Note (Signed)
Uncontrolled, patient reports excessive anxiety and irritability , especially ion the job. Reports behaving "out of character", cursing "like a sailor" etc States there is a lot of mental health illness in her family, schiz, bipolar, ready for better control of her mental health, will refer to psych. Denies suicidal or homicidal ideation, no hallucinations

## 2014-12-03 NOTE — Progress Notes (Signed)
  Patient is in for annual physical exam. Immunization is reviewed , and  updated if needed. Depression and anxiety are uncontrolled, and screening test on fluoxetine 40 mg reveals the need for psychiatric help. Patient is neither suicidal or homicidal, but is definitely interested in help   PE: Pleasant well nourished female, alert and oriented x 3, in no cardio-pulmonary distress. Afebrile. HEENT No facial trauma or asymetry. Sinuses non tender.  Extra occullar muscles intact, pupils equally reactive to light. External ears normal, tympanic membranes clear. Oropharynx moist, no exudate, good dentition. Neck: supple, no adenopathy,JVD or thyromegaly.No bruits.  Chest: Clear to ascultation bilaterally.No crackles or wheezes. Non tender to palpation  Breast: No asymetry,no masses or lumps. No tenderness. No nipple discharge or inversion. No axillary or supraclavicular adenopathy  Cardiovascular system; Heart sounds normal,  S1 and  S2 ,no S3.  No murmur, or thrill. Apical beat not displaced Peripheral pulses normal.  Abdomen: Soft, non tender, no organomegaly or masses. No bruits. Bowel sounds normal. No guarding, tenderness or rebound.  Rectal:  Normal sphincter tone. No mass.No rectal masses.  Guaiac negative stool.  GU: External genitalia normal female genitalia , female distribution of hair. No lesions. Urethral meatus normal in size, no  Prolapse, no lesions visibly  Present. Bladder non tender. Vagina pink and moist , with no visible lesions , white  discharge present . Adequate pelvic support no  cystocele or rectocele noted Cervix pink and appears healthy, no lesions or ulcerations noted, no discharge noted from os Uterus enlarged, no adnexal masses, no cervical motion or adnexal tenderness.   Musculoskeletal exam: Full ROM of spine, hips , shoulders and knees. No deformity ,swelling or crepitus noted. No muscle wasting or atrophy.   Neurologic: Cranial  nerves 2 to 12 intact. Power, tone ,sensation and reflexes normal throughout. No disturbance in gait. No tremor.  Skin: Intact, no ulceration, erythema , scaling or rash noted. Pigmentation normal throughout  Psych; Depressed and slightly anxious mood, not suicidal or homicidal   Assessment & Plan:  Annual physical exam Annual exam as documented. Counseling done  re healthy lifestyle involving commitment to 150 minutes exercise per week, heart healthy diet, and attaining healthy weight.The importance of adequate sleep also discussed. Regular seat belt use and home safety, is also discussed. Changes in health habits are decided on by the patient with goals and time frames  set for achieving them. Immunization and cancer screening needs are specifically addressed at this visit.    Depression Uncontrolled, patient reports excessive anxiety and irritability , especially ion the job. Reports behaving "out of character", cursing "like a sailor" etc States there is a lot of mental health illness in her family, schiz, bipolar, ready for better control of her mental health, will refer to psych. Denies suicidal or homicidal ideation, no hallucinations

## 2014-12-06 ENCOUNTER — Telehealth (HOSPITAL_COMMUNITY): Payer: Self-pay | Admitting: *Deleted

## 2014-12-06 ENCOUNTER — Encounter: Payer: Self-pay | Admitting: Family Medicine

## 2014-12-06 ENCOUNTER — Other Ambulatory Visit: Payer: Self-pay | Admitting: Family Medicine

## 2014-12-06 DIAGNOSIS — R87612 Low grade squamous intraepithelial lesion on cytologic smear of cervix (LGSIL): Secondary | ICD-10-CM

## 2014-12-06 DIAGNOSIS — R87810 Cervical high risk human papillomavirus (HPV) DNA test positive: Secondary | ICD-10-CM | POA: Insufficient documentation

## 2014-12-06 MED ORDER — ERGOCALCIFEROL 1.25 MG (50000 UT) PO CAPS: 50000.0000 [IU] | ORAL_CAPSULE | ORAL | Status: DC

## 2014-12-06 MED ORDER — ROSUVASTATIN CALCIUM 10 MG PO TABS: 10.0000 mg | ORAL_TABLET | Freq: Every day | ORAL | Status: DC

## 2014-12-06 NOTE — Addendum Note (Signed)
Addended by: Denman George B on: 12/06/2014 04:50 PM   Modules accepted: Orders

## 2014-12-07 LAB — CERVICOVAGINAL ANCILLARY ONLY: Wet Prep (BD Affirm): NEGATIVE

## 2014-12-10 ENCOUNTER — Telehealth: Payer: Self-pay

## 2014-12-10 ENCOUNTER — Other Ambulatory Visit: Payer: Self-pay

## 2014-12-10 MED ORDER — PRAVASTATIN SODIUM 40 MG PO TABS: 40.0000 mg | ORAL_TABLET | Freq: Every day | ORAL | Status: DC

## 2014-12-10 NOTE — Telephone Encounter (Signed)
Pt aware.

## 2014-12-10 NOTE — Telephone Encounter (Signed)
pls send pravchol 40 mg daily x 6 months, d/c crestor , advise OTC vit D3  Tabs once daily 800 IU or 1000 IU

## 2014-12-10 NOTE — Telephone Encounter (Signed)
States the crestor and vit d 50000 was too much. (offered coupon but its only good for 3 months) Wants a generic med sent to [facility] and also wants to only take OTC vitamin D and wants to know what strength she needs?

## 2014-12-27 ENCOUNTER — Encounter: Payer: PRIVATE HEALTH INSURANCE | Admitting: Obstetrics and Gynecology

## 2015-01-06 ENCOUNTER — Other Ambulatory Visit: Payer: Self-pay | Admitting: Family Medicine

## 2015-01-12 ENCOUNTER — Other Ambulatory Visit: Payer: Self-pay | Admitting: Obstetrics and Gynecology

## 2015-01-12 ENCOUNTER — Encounter: Payer: Self-pay | Admitting: Obstetrics and Gynecology

## 2015-01-12 ENCOUNTER — Ambulatory Visit (INDEPENDENT_AMBULATORY_CARE_PROVIDER_SITE_OTHER): Payer: PRIVATE HEALTH INSURANCE | Admitting: Obstetrics and Gynecology

## 2015-01-12 VITALS — BP 120/90 | Ht 61.0 in | Wt 140.0 lb

## 2015-01-12 DIAGNOSIS — R87612 Low grade squamous intraepithelial lesion on cytologic smear of cervix (LGSIL): Secondary | ICD-10-CM

## 2015-01-12 NOTE — Progress Notes (Signed)
Patient ID: [name] [name], female   DOB: 22-Mar-1971, 44 y.o.   MRN: [id number]   [name] [name] 44 y.o. [id number] here for colposcopy for low-grade squamous intraepithelial neoplasia (LGSIL - encompassing HPV,mild dysplasia,CIN I) pap smear on 12/01/14.  Discussed role for HPV in cervical dysplasia, need for surveillance.  Patient given informed consent, signed copy in the chart, time out was performed.  Placed in lithotomy position. Cervix viewed with speculum and colposcope after application of acetic acid.   Colposcopy adequate? Yes  no visible lesions, no mosaicism, no punctation and no abnormal vasculature; biopsies obtained at 12 o'clock.   ECC specimen obtained. All specimens were labelled and sent to pathology.  Uterus is well-supported.  Colposcopy IMPRESSION: NO ViSIBLE DYSPLASIA  Patient was given post procedure instructions. Will follow up pathology , ECC AND CX BX and manage accordingly. CURRENT PLAN : REPAP 1 YR  Routine preventative health maintenance measures emphasized.   This chart was SCRIBED for Mallory Shirk, MD by Stephania Fragmin, ED Scribe. This patient was seen in room 2 and the patient's care was started at 9:19 AM.  I personally performed the services described in this documentation, which was SCRIBED in my presence. The recorded information has been reviewed and considered accurate. It has been edited as necessary during review. Jonnie Kind, MD

## 2015-01-12 NOTE — Progress Notes (Signed)
Patient ID: [name] [name], female   DOB: Oct 24, 1970, 44 y.o.   MRN: [id number] Pt here today for colposcopy due to abnormal pap.

## 2015-01-17 ENCOUNTER — Telehealth: Payer: Self-pay | Admitting: Obstetrics and Gynecology

## 2015-01-17 NOTE — Telephone Encounter (Signed)
Pt states had a colposcopy on 01/12/2015 and a biopsy was obtained. Having some cramping is this normal, no vaginal bleeding. Informed pt can have cramping the day of and 24 hours after procedure. Pt advised to take ibuprofen if no improvement call our office back to be seen. Pt verbalized understanding.

## 2015-02-09 ENCOUNTER — Ambulatory Visit (INDEPENDENT_AMBULATORY_CARE_PROVIDER_SITE_OTHER): Payer: PRIVATE HEALTH INSURANCE | Admitting: Psychiatry

## 2015-02-09 ENCOUNTER — Encounter: Payer: Self-pay | Admitting: Adult Health

## 2015-02-09 ENCOUNTER — Encounter (HOSPITAL_COMMUNITY): Payer: Self-pay | Admitting: Psychiatry

## 2015-02-09 ENCOUNTER — Ambulatory Visit (INDEPENDENT_AMBULATORY_CARE_PROVIDER_SITE_OTHER): Payer: PRIVATE HEALTH INSURANCE | Admitting: Adult Health

## 2015-02-09 VITALS — BP 128/91 | HR 77 | Ht 61.0 in | Wt 143.0 lb

## 2015-02-09 VITALS — BP 116/70 | HR 80 | Ht 60.25 in | Wt 143.0 lb

## 2015-02-09 DIAGNOSIS — N760 Acute vaginitis: Secondary | ICD-10-CM | POA: Diagnosis not present

## 2015-02-09 DIAGNOSIS — B9689 Other specified bacterial agents as the cause of diseases classified elsewhere: Secondary | ICD-10-CM

## 2015-02-09 DIAGNOSIS — N9489 Other specified conditions associated with female genital organs and menstrual cycle: Secondary | ICD-10-CM | POA: Diagnosis not present

## 2015-02-09 DIAGNOSIS — N898 Other specified noninflammatory disorders of vagina: Secondary | ICD-10-CM | POA: Insufficient documentation

## 2015-02-09 DIAGNOSIS — A499 Bacterial infection, unspecified: Secondary | ICD-10-CM

## 2015-02-09 DIAGNOSIS — F4323 Adjustment disorder with mixed anxiety and depressed mood: Secondary | ICD-10-CM | POA: Diagnosis not present

## 2015-02-09 HISTORY — DX: Other specified bacterial agents as the cause of diseases classified elsewhere: B96.89

## 2015-02-09 HISTORY — DX: Other specified noninflammatory disorders of vagina: N89.8

## 2015-02-09 LAB — POCT WET PREP (WET MOUNT)
Clue Cells Wet Prep Whiff POC: POSITIVE
WBC, Wet Prep HPF POC: POSITIVE

## 2015-02-09 MED ORDER — METRONIDAZOLE 500 MG PO TABS: 500.0000 mg | ORAL_TABLET | Freq: Two times a day (BID) | ORAL | Status: DC

## 2015-02-09 MED ORDER — CLONAZEPAM 0.5 MG PO TABS: 0.5000 mg | ORAL_TABLET | Freq: Two times a day (BID) | ORAL | Status: DC | PRN

## 2015-02-09 NOTE — Patient Instructions (Signed)
Bacterial Vaginosis Bacterial vaginosis is a vaginal infection that occurs when the normal balance of bacteria in the vagina is disrupted. It results from an overgrowth of certain bacteria. This is the most common vaginal infection in women of childbearing age. Treatment is important to prevent complications, especially in pregnant women, as it can cause a premature delivery. CAUSES  Bacterial vaginosis is caused by an increase in harmful bacteria that are normally present in smaller amounts in the vagina. Several different kinds of bacteria can cause bacterial vaginosis. However, the reason that the condition develops is not fully understood. RISK FACTORS Certain activities or behaviors can put you at an increased risk of developing bacterial vaginosis, including:  Having a new sex partner or multiple sex partners.  Douching.  Using an intrauterine device (IUD) for contraception. Women do not get bacterial vaginosis from toilet seats, bedding, swimming pools, or contact with objects around them. SIGNS AND SYMPTOMS  Some women with bacterial vaginosis have no signs or symptoms. Common symptoms include:  Grey vaginal discharge.  A fishlike odor with discharge, especially after sexual intercourse.  Itching or burning of the vagina and vulva.  Burning or pain with urination. DIAGNOSIS  Your health care provider will take a medical history and examine the vagina for signs of bacterial vaginosis. A sample of vaginal fluid may be taken. Your health care provider will look at this sample under a microscope to check for bacteria and abnormal cells. A vaginal pH test may also be done.  TREATMENT  Bacterial vaginosis may be treated with antibiotic medicines. These may be given in the form of a pill or a vaginal cream. A second round of antibiotics may be prescribed if the condition comes back after treatment.  HOME CARE INSTRUCTIONS   Only take over-the-counter or prescription medicines as  directed by your health care provider.  If antibiotic medicine was prescribed, take it as directed. Make sure you finish it even if you start to feel better.  Do not have sex until treatment is completed.  Tell all sexual partners that you have a vaginal infection. They should see their health care provider and be treated if they have problems, such as a mild rash or itching.  Practice safe sex by using condoms and only having one sex partner. SEEK MEDICAL CARE IF:   Your symptoms are not improving after 3 days of treatment.  You have increased discharge or pain.  You have a fever. MAKE SURE YOU:   Understand these instructions.  Will watch your condition.  Will get help right away if you are not doing well or get worse. FOR MORE INFORMATION  Centers for Disease Control and Prevention, Division of STD Prevention: AppraiserFraud.fi American Sexual Health Association (ASHA): www.ashastd.org  Document Released: 08/13/2005 Document Revised: 06/03/2013 Document Reviewed: 03/25/2013 [facility] Patient Information 2015 [place], [place]. This information is not intended to replace advice given to you by your health care provider. Make sure you discuss any questions you have with your health care provider. No alcohol

## 2015-02-09 NOTE — Progress Notes (Signed)
Subjective:     Patient ID: [name] [name], female   DOB: 1970/09/26, 44 y.o.   MRN: [id number]  HPI [name] [name] is a 44 year old black female in complaining of vaginal discharge and odor, no itching or burning has had for 4 days now, thinks it is BV.  Review of Systems Patient denies any headaches, hearing loss, fatigue, blurred vision, shortness of breath, chest pain, abdominal pain, problems with bowel movements, urination, or intercourse. No joint pain or mood swings.+vaginal discharge with odor  Reviewed past medical,surgical, social and family history. Reviewed medications and allergies.     Objective:   Physical Exam BP 116/70 mmHg  Pulse 80  Ht 5' 0.25" (1.53 m)  Wt 143 lb (64.864 kg)  BMI 27.71 kg/m2  LMP 01/17/2015 Skin warm and dry.Pelvic: external genitalia is normal in appearance no lesions, vagina: white discharge with odor,urethra has no lesions or masses noted, cervix:smooth and bulbous, uterus: normal size, shape and contour, non tender, no masses felt, adnexa: no masses or tenderness noted. Bladder is non tender and no masses felt. Wet prep: + for clue cells and +WBCs.    Assessment:    Vaginal discharge Vaginal odor BV    Plan:     Rx flagyl 500 mg 1 bid x 7 days, #14 with 2 refills no alcohol, review handout on BV   Follow up prn
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
## 2015-02-16 ENCOUNTER — Telehealth: Payer: Self-pay | Admitting: Family Medicine

## 2015-02-16 NOTE — Telephone Encounter (Signed)
Pls contact pt mammogram form [facility] done on 6/16 reports that she needs additional imaging of left breast to ensure she has no breast mass, needs diagnostuic mammogram and possible [place] of left breast, she needs to contact the dept and set up if they have not contacted her in next 7 to 10 days, I will sign order for diag mammo , pls write and it can be faxed over to [facility]

## 2015-02-21 ENCOUNTER — Telehealth (HOSPITAL_COMMUNITY): Payer: Self-pay | Admitting: *Deleted

## 2015-02-22 NOTE — Telephone Encounter (Signed)
Patient aware and will call.

## 2015-02-23 ENCOUNTER — Encounter: Payer: Self-pay | Admitting: Family Medicine

## 2015-03-04 ENCOUNTER — Telehealth: Payer: Self-pay | Admitting: Family Medicine

## 2015-03-04 NOTE — Telephone Encounter (Signed)
Pts most recent mammogram in June at [facility], stated that she needs additional imaging and that she would be contacted, pls make her aware of this , s that she can follow through, thanks

## 2015-03-04 NOTE — Telephone Encounter (Signed)
Patient was notified to cal to the [facility] to schedule additional imaging

## 2015-03-22 ENCOUNTER — Encounter: Payer: Self-pay | Admitting: Family Medicine

## 2015-03-23 ENCOUNTER — Ambulatory Visit (HOSPITAL_COMMUNITY): Payer: Self-pay | Admitting: Psychiatry

## 2015-04-04 ENCOUNTER — Other Ambulatory Visit: Payer: Self-pay

## 2015-04-04 ENCOUNTER — Other Ambulatory Visit: Payer: Self-pay | Admitting: Family Medicine

## 2015-04-04 ENCOUNTER — Telehealth: Payer: Self-pay

## 2015-04-04 MED ORDER — PANTOPRAZOLE SODIUM 20 MG PO TBEC: 20.0000 mg | DELAYED_RELEASE_TABLET | Freq: Every day | ORAL | Status: DC

## 2015-04-04 NOTE — Telephone Encounter (Signed)
Patient states that [name] [name] has a zero copay for protonix and wants to switch to that. Wants me to mail the rx to her

## 2015-04-04 NOTE — Telephone Encounter (Signed)
Entered pls print and mail, if she wants 90 day pls change number with 1 refill

## 2015-04-04 NOTE — Telephone Encounter (Signed)
Med changed and mailed

## 2015-04-12 ENCOUNTER — Ambulatory Visit (HOSPITAL_COMMUNITY): Payer: Self-pay | Admitting: Psychiatry

## 2015-04-12 ENCOUNTER — Other Ambulatory Visit: Payer: Self-pay

## 2015-06-01 ENCOUNTER — Ambulatory Visit: Payer: Self-pay | Admitting: Family Medicine

## 2015-06-01 ENCOUNTER — Ambulatory Visit (HOSPITAL_COMMUNITY): Payer: Self-pay | Admitting: Psychiatry

## 2015-06-17 ENCOUNTER — Telehealth: Payer: Self-pay

## 2015-06-17 NOTE — Telephone Encounter (Signed)
Having a lot of sinus congestion and pressure. No fever, bodyaches Clear mucus. Wanted to come in for a steroid injection for allergies. Advised I would see if a dose pack could be sent in for her to [facility]

## 2015-06-20 MED ORDER — PREDNISONE 5 MG (21) PO TBPK: ORAL_TABLET | ORAL | Status: DC

## 2015-06-20 NOTE — Telephone Encounter (Signed)
Pls offer depo medol 80 mg im today as nurs visit, and send pred  5 mg dose pack #21 only,

## 2015-06-20 NOTE — Addendum Note (Signed)
Addended by: Denman George B on: 06/20/2015 09:02 AM   Modules accepted: Orders

## 2015-06-20 NOTE — Telephone Encounter (Signed)
Called patient and left message for them to return call at the office   

## 2015-06-20 NOTE — Telephone Encounter (Signed)
Patient returned call.  States that she went to the urgent care and received an injection.  She would just like for the medication to be sent to pharmacy.

## 2015-06-22 ENCOUNTER — Other Ambulatory Visit: Payer: Self-pay | Admitting: Family Medicine

## 2015-06-23 ENCOUNTER — Ambulatory Visit (INDEPENDENT_AMBULATORY_CARE_PROVIDER_SITE_OTHER): Payer: PRIVATE HEALTH INSURANCE | Admitting: Psychiatry

## 2015-06-23 ENCOUNTER — Encounter (HOSPITAL_COMMUNITY): Payer: Self-pay | Admitting: Psychiatry

## 2015-06-23 VITALS — BP 143/97 | HR 84 | Ht 60.25 in | Wt 143.2 lb

## 2015-06-23 DIAGNOSIS — F4323 Adjustment disorder with mixed anxiety and depressed mood: Secondary | ICD-10-CM | POA: Diagnosis not present

## 2015-06-23 MED ORDER — FLUOXETINE HCL 40 MG PO CAPS: 40.0000 mg | ORAL_CAPSULE | Freq: Every day | ORAL | Status: DC

## 2015-06-23 MED ORDER — CLONAZEPAM 1 MG PO TABS: 1.0000 mg | ORAL_TABLET | Freq: Two times a day (BID) | ORAL | Status: DC
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
## 2015-08-24 ENCOUNTER — Ambulatory Visit (HOSPITAL_COMMUNITY): Payer: Self-pay | Admitting: Psychiatry

## 2015-08-24 ENCOUNTER — Other Ambulatory Visit: Payer: Self-pay | Admitting: Family Medicine

## 2015-09-14 ENCOUNTER — Other Ambulatory Visit: Payer: Self-pay | Admitting: Family Medicine

## 2015-10-03 ENCOUNTER — Telehealth: Payer: Self-pay | Admitting: Specialist

## 2015-10-03 ENCOUNTER — Telehealth: Payer: Self-pay | Admitting: Family Medicine

## 2015-10-03 MED ORDER — MOMETASONE FUROATE 50 MCG/ACT NA SUSP: 2.0000 | Freq: Every day | NASAL | Status: DC

## 2015-10-03 MED ORDER — MECLIZINE HCL 12.5 MG PO TABS: 12.5000 mg | ORAL_TABLET | Freq: Three times a day (TID) | ORAL | Status: DC | PRN

## 2015-10-03 MED ORDER — PREDNISONE 5 MG PO TABS: 5.0000 mg | ORAL_TABLET | Freq: Two times a day (BID) | ORAL | Status: AC

## 2015-10-03 NOTE — Telephone Encounter (Signed)
Patient is complaing of sinus issues and is asking if something can be called in to [facility], please advise?

## 2015-10-03 NOTE — Telephone Encounter (Signed)
X yesterday, sinus congestion (clear) pressure in face and right ear pain. Somewhat off balance. No cough or fever and has been using zyrtec/saline with no relief and has to work tonight and she doesn't feel good. Wants something called into [facility].

## 2015-10-03 NOTE — Telephone Encounter (Signed)
Pt aware.

## 2015-10-03 NOTE — Telephone Encounter (Signed)
Nasonex, prednisone and antivert are sent pls let her know

## 2015-10-04 NOTE — Telephone Encounter (Signed)
OPEN IN ERROR 

## 2015-11-10 ENCOUNTER — Ambulatory Visit (INDEPENDENT_AMBULATORY_CARE_PROVIDER_SITE_OTHER): Payer: PRIVATE HEALTH INSURANCE | Admitting: Family Medicine

## 2015-11-10 ENCOUNTER — Encounter: Payer: Self-pay | Admitting: Family Medicine

## 2015-11-10 VITALS — BP 114/80 | HR 95 | Resp 16 | Ht 61.0 in | Wt 143.0 lb

## 2015-11-10 DIAGNOSIS — K219 Gastro-esophageal reflux disease without esophagitis: Secondary | ICD-10-CM

## 2015-11-10 DIAGNOSIS — M25579 Pain in unspecified ankle and joints of unspecified foot: Secondary | ICD-10-CM | POA: Diagnosis not present

## 2015-11-10 DIAGNOSIS — M722 Plantar fascial fibromatosis: Secondary | ICD-10-CM | POA: Diagnosis not present

## 2015-11-10 DIAGNOSIS — I10 Essential (primary) hypertension: Secondary | ICD-10-CM | POA: Diagnosis not present

## 2015-11-10 MED ORDER — KETOROLAC TROMETHAMINE 60 MG/2ML IM SOLN
60.0000 mg | Freq: Once | INTRAMUSCULAR | Status: AC
  Administered 2015-11-10: 60 mg via INTRAMUSCULAR

## 2015-11-10 MED ORDER — NAPROXEN 500 MG PO TABS: 500.0000 mg | ORAL_TABLET | Freq: Two times a day (BID) | ORAL | Status: DC

## 2015-11-10 MED ORDER — PREDNISONE 5 MG (21) PO TBPK: ORAL_TABLET | ORAL | Status: DC

## 2015-11-10 MED ORDER — METHYLPREDNISOLONE ACETATE 80 MG/ML IJ SUSP
80.0000 mg | Freq: Once | INTRAMUSCULAR | Status: AC
  Administered 2015-11-10: 80 mg via INTRAMUSCULAR

## 2015-11-10 NOTE — Patient Instructions (Signed)
CPE in May as before   Call if you need me sooner  mosed with plantar fasciitis  Injections in office today followed by medication Work excuse from 3/15 to return 10/27/20/2017 You are referred also to [facility] , soonest appt available, we will call in am with appt info

## 2015-11-10 NOTE — Progress Notes (Signed)
   Subjective:    Patient ID: [name] [name], female    DOB: 04-Jun-1971, 45 y.o.   MRN: [id number]  HPI  C/o bilateral foot pain rated  At a 50 whneever she stands on them started this past Sunday , 4 days ago. Works 12 hr shift , and states she sometimes works for 6 days straight, she is simply "worn out" as she is on her feet most of the time, and is unable to continue to work overtime due to Com[employer]. Did not go to work yesterday, because of unbearable foot pain, bilaterally, esp in insteps, "want my feet fixed" No direct trauma to feet other than prolonged standing and walking/ weight bearing Review of Systems See HPI Denies recent fever or chills. Denies sinus pressure, nasal congestion, ear pain or sore throat. Denies chest congestion, productive cough or wheezing. Denies chest pains, palpitations and leg swelling Denies abdominal pain, nausea, vomiting,diarrhea or constipation.   Denies dysuria, frequency, hesitancy or incontinence. Denies headaches, seizures, numbness, or tingling. Denies depression, anxiety or insomnia. Denies skin break down or rash.        Objective:   Physical Exam BP 114/80 mmHg  Pulse 95  Resp 16  Ht 5\' 1"  (1.549 m)  Wt 143 lb (64.864 kg)  BMI 27.03 kg/m2  SpO2 96% Patient alert and oriented and in no cardiopulmonary distress.  HEENT: No facial asymmetry, EOMI,   oropharynx pink and moist.  Neck supple no JVD, no mass.  Chest: Clear to auscultation bilaterally.  CVS: S1, S2 no murmurs, no S3.Regular rate.  ABD: Soft non tender.   Ext: No edema  MS: Adequate ROM spine, shoulders, hips and knees. Tender to palpation in both insteps, anls heel tenderness Skin: Intact, no ulcerations or rash noted.  Psych: Good eye contact, normal affect. Memory intact not anxious or depressed appearing.  CNS: CN 2-12 intact, power,  normal throughout.no focal deficits noted.       Assessment & Plan:  Bilateral plantar  fasciitis Uncontrolled.Toradol and depo medrol administered IM in the office , to be followed by a short course of oral prednisone and NSAIDS.   Benign essential HTN Controlled, no change in medication DASH diet and commitment to daily physical activity for a minimum of 30 minutes discussed and encouraged, as a part of hypertension management. The importance of attaining a healthy weight is also discussed.  BP/Weight 11/10/2015 02/09/2015 01/12/2015 12/01/2014 06/15/2014 0000000 [id number]  Systolic BP 99991111 [id number] 0000000 [id number]  Diastolic BP 80 70 90 74 80 98 60  Wt. (Lbs) 143 143 140 141 136.5 140 134  BMI 27.03 27.71 26.47 26.66 25.8 26.47 25.33  Some encounter information is confidential and restricted. Go to Review Flowsheets activity to see all data.        GERD Controlled, no change in medication

## 2015-11-11 DIAGNOSIS — M722 Plantar fascial fibromatosis: Secondary | ICD-10-CM | POA: Insufficient documentation

## 2015-11-11 NOTE — Assessment & Plan Note (Signed)
Controlled, no change in medication  

## 2015-11-11 NOTE — Assessment & Plan Note (Signed)
Controlled, no change in medication DASH diet and commitment to daily physical activity for a minimum of 30 minutes discussed and encouraged, as a part of hypertension management. The importance of attaining a healthy weight is also discussed.  BP/Weight 11/10/2015 02/09/2015 01/12/2015 12/01/2014 06/15/2014 0000000 [id number]  Systolic BP 99991111 99991111 123456 [id number] 0000000 [id number]  Diastolic BP 80 70 90 74 80 98 60  Wt. (Lbs) 143 143 140 141 136.5 140 134  BMI 27.03 27.71 26.47 26.66 25.8 26.47 25.33  Some encounter information is confidential and restricted. Go to Review Flowsheets activity to see all data.

## 2015-11-11 NOTE — Assessment & Plan Note (Signed)
Uncontrolled.Toradol and depo medrol administered IM in the office , to be followed by a short course of oral prednisone and NSAIDS.  

## 2015-11-22 ENCOUNTER — Telehealth: Payer: Self-pay | Admitting: Family Medicine

## 2015-11-22 ENCOUNTER — Other Ambulatory Visit: Payer: Self-pay

## 2015-11-22 MED ORDER — NAPROXEN 500 MG PO TABS: 500.0000 mg | ORAL_TABLET | Freq: Two times a day (BID) | ORAL | Status: DC

## 2015-11-22 NOTE — Telephone Encounter (Signed)
Med refilled.

## 2015-11-22 NOTE — Telephone Encounter (Signed)
Patient is calling asking for a refill on naproxen (NAPROSYN) 500 MG tablet  She does not see the podiatrist until 12/06/15, please advise?

## 2015-11-23 ENCOUNTER — Other Ambulatory Visit: Payer: Self-pay | Admitting: Family Medicine

## 2015-11-30 ENCOUNTER — Encounter (HOSPITAL_COMMUNITY): Payer: Self-pay | Admitting: Psychiatry

## 2015-11-30 ENCOUNTER — Ambulatory Visit (INDEPENDENT_AMBULATORY_CARE_PROVIDER_SITE_OTHER): Payer: PRIVATE HEALTH INSURANCE | Admitting: Psychiatry

## 2015-11-30 VITALS — BP 123/70 | HR 63 | Ht 61.0 in | Wt 142.6 lb

## 2015-11-30 DIAGNOSIS — F4323 Adjustment disorder with mixed anxiety and depressed mood: Secondary | ICD-10-CM

## 2015-11-30 MED ORDER — SERTRALINE HCL 100 MG PO TABS: 100.0000 mg | ORAL_TABLET | Freq: Every day | ORAL | Status: DC

## 2015-11-30 MED ORDER — CLONAZEPAM 1 MG PO TABS: 1.0000 mg | ORAL_TABLET | Freq: Two times a day (BID) | ORAL | Status: DC
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
## 2015-12-06 ENCOUNTER — Encounter: Payer: Self-pay | Admitting: Sports Medicine

## 2015-12-06 ENCOUNTER — Ambulatory Visit (INDEPENDENT_AMBULATORY_CARE_PROVIDER_SITE_OTHER): Payer: PRIVATE HEALTH INSURANCE | Admitting: Sports Medicine

## 2015-12-06 ENCOUNTER — Ambulatory Visit (INDEPENDENT_AMBULATORY_CARE_PROVIDER_SITE_OTHER): Payer: PRIVATE HEALTH INSURANCE

## 2015-12-06 VITALS — BP 125/81 | HR 75 | Resp 16

## 2015-12-06 DIAGNOSIS — M79672 Pain in left foot: Secondary | ICD-10-CM | POA: Diagnosis not present

## 2015-12-06 DIAGNOSIS — M722 Plantar fascial fibromatosis: Secondary | ICD-10-CM

## 2015-12-06 DIAGNOSIS — M79671 Pain in right foot: Secondary | ICD-10-CM | POA: Diagnosis not present

## 2015-12-06 MED ORDER — DICLOFENAC SODIUM 75 MG PO TBEC: 75.0000 mg | DELAYED_RELEASE_TABLET | Freq: Two times a day (BID) | ORAL | Status: DC

## 2015-12-06 MED ORDER — TRIAMCINOLONE ACETONIDE 10 MG/ML IJ SUSP: 10.0000 mg | Freq: Once | INTRAMUSCULAR | Status: DC

## 2015-12-06 MED ORDER — METHYLPREDNISOLONE 4 MG PO TBPK: ORAL_TABLET | ORAL | Status: DC

## 2015-12-06 NOTE — Progress Notes (Deleted)
   Subjective:    Patient ID: [name] [name], female    DOB: 1970/12/18, 45 y.o.   MRN: [id number]  HPI    Review of Systems  All other systems reviewed and are negative.      Objective:   Physical Exam        Assessment & Plan:

## 2015-12-06 NOTE — Patient Instructions (Signed)

## 2015-12-06 NOTE — Progress Notes (Signed)
Patient ID: [name] [name], female   DOB: 01/14/1971, 45 y.o.   MRN: [id number] Subjective: [name] [name] is a 45 y.o. female patient presents to office with complaint of heel pain on the left and right. Patient admits to post static dyskinesia for 2 weeks in duration. Patient has treated this problem with inserts and change in shoes; states that her feet just hurt and throb; reports that her feet started to get worse after 8 days straight in ER. Patient denies any other pedal complaints.  Patient Active Problem List   Diagnosis Date Noted  . Bilateral plantar fasciitis 11/11/2015  . Vaginal discharge 02/09/2015  . Vaginal odor 02/09/2015  . BV (bacterial vaginosis) 02/09/2015  . Cervical high risk human papillomavirus (HPV) DNA test positive 12/06/2014  . Vaginitis and vulvovaginitis 04/11/2014  . Benign essential HTN 04/08/2014  . Sterilization consult 12/03/2013  . Generalized anxiety disorder 11/23/2013  . Fibroids 10/28/2013  . Depression 07/31/2013  . Insomnia 07/31/2013  . Annual physical exam 09/22/2012  . Vitamin D deficiency 12/24/2010  . Hyperlipemia 11/30/2009  . Allergic rhinitis 07/27/2009  . GERD 07/27/2009    Current Outpatient Prescriptions on File Prior to Visit  Medication Sig Dispense Refill  . clonazePAM (KLONOPIN) 1 MG tablet Take 1 tablet (1 mg total) by mouth 2 (two) times daily. 60 tablet 2  . mometasone (NASONEX) 50 MCG/ACT nasal spray Place 2 sprays into the nose daily. 17 g 12  . montelukast (SINGULAIR) 10 MG tablet TAKE 1 TABLET BY MOUTH EVERY NIGHT AT BEDTIME 30 tablet 3  . naproxen (NAPROSYN) 500 MG tablet Take 1 tablet (500 mg total) by mouth 2 (two) times daily with a meal. X 7 days 14 tablet 0  . pantoprazole (PROTONIX) 40 MG tablet Take 1 tablet (40 mg total) by mouth daily. 90 tablet 0  . pravastatin (PRAVACHOL) 40 MG tablet TAKE 1 TABLET BY MOUTH EVERY DAY 30 tablet 2  . sertraline (ZOLOFT) 100 MG tablet Take 1 tablet (100 mg total) by mouth  daily. 30 tablet 2  . triamterene-hydrochlorothiazide (MAXZIDE-25) 37.5-25 MG tablet TAKE 1 TABLET BY MOUTH EVERY DAY 30 tablet 3   No current facility-administered medications on file prior to visit.    No Known Allergies  Objective: Physical Exam General: The patient is alert and oriented x3 in no acute distress.  Dermatology: Skin is warm, dry and supple bilateral lower extremities. Nails 1-10 are normal. There is no erythema, edema, no eccymosis, no open lesions present. Integument is otherwise unremarkable.  Vascular: Dorsalis Pedis pulse and Posterior Tibial pulse are 2/4 bilateral. Capillary fill time is immediate to all digits.  Neurological: Grossly intact to light touch with an achilles reflex of +2/5 and a  negative Tinel's sign bilateral.  Musculoskeletal: Tenderness to palpation at the medial calcaneal tubercale and through the insertion of the plantar fascia on the left and right foot. No pain with compression of calcaneus bilateral. No pain with tuning fork to calcaneus bilateral. No pain with calf compression bilateral. There is decreased Ankle joint range of motion bilateral. All other joints range of motion within normal limits bilateral. Strength 5/5 in all groups bilateral.   Xray, Right/Left foot:  Normal osseous mineralization. Joint spaces preserved. No fracture/dislocation/boney destruction. Hammertoe deformity, Minimal Calcaneal spur present with mild thickening of plantar fascia. No other soft tissue abnormalities or radiopaque foreign bodies.   Assessment and Plan: Problem List Items Addressed This Visit    None    Visit Diagnoses  Plantar fasciitis    -  Primary    Relevant Medications    triamcinolone acetonide (KENALOG) 10 MG/ML injection 10 mg    methylPREDNISolone (MEDROL DOSEPAK) 4 MG TBPK tablet    diclofenac (VOLTAREN) 75 MG EC tablet    Other Relevant Orders    DG Foot 2 Views Right    DG Foot 2 Views Left    Foot pain, bilateral         Relevant Medications    triamcinolone acetonide (KENALOG) 10 MG/ML injection 10 mg    methylPREDNISolone (MEDROL DOSEPAK) 4 MG TBPK tablet    diclofenac (VOLTAREN) 75 MG EC tablet       -Complete examination performed. Discussed with patient in detail the condition of plantar fasciitis, how this occurs and general treatment options. Explained both conservative and surgical treatments.  -After oral consent and aseptic prep, injected a mixture containing 1 ml of 2%  plain lidocaine, 1 ml 0.5% plain marcaine, 0.5 ml of kenalog 10 and 0.5 ml of dexamethasone phosphate into left and right heel. Post-injection care discussed with patient.  -Rx Diclofenac to start after Medrol dose pack is completed -Recommended good supportive shoes and advised use of OTC insert. Explained to patient that if these orthoses work well, we will continue with these. If these do not improve her condition and  pain, we will consider custom molded orthoses. - Explained in detail the use of the fascial brace which was dispensed at today's visit. -Explained and dispensed to patient daily stretching exercises. -Recommend patient to ice affected area 1-2x daily. -Patient to return to office in 3 weeks for follow up or sooner if problems or questions arise.  [name], DPM

## 2016-01-03 ENCOUNTER — Ambulatory Visit: Payer: PRIVATE HEALTH INSURANCE | Admitting: Sports Medicine

## 2016-01-05 ENCOUNTER — Other Ambulatory Visit: Payer: Self-pay | Admitting: Family Medicine

## 2016-01-17 ENCOUNTER — Ambulatory Visit: Payer: PRIVATE HEALTH INSURANCE | Admitting: Sports Medicine

## 2016-01-19 ENCOUNTER — Encounter: Payer: Self-pay | Admitting: Family Medicine

## 2016-01-25 ENCOUNTER — Telehealth (HOSPITAL_COMMUNITY): Payer: Self-pay | Admitting: *Deleted

## 2016-01-27 ENCOUNTER — Telehealth (HOSPITAL_COMMUNITY): Payer: Self-pay | Admitting: *Deleted

## 2016-02-26 ENCOUNTER — Other Ambulatory Visit: Payer: Self-pay | Admitting: Adult Health

## 2016-02-27 ENCOUNTER — Telehealth: Payer: Self-pay | Admitting: Family Medicine

## 2016-02-27 MED ORDER — FLUCONAZOLE 150 MG PO TABS: ORAL_TABLET | ORAL | Status: DC

## 2016-02-27 NOTE — Telephone Encounter (Signed)
Called and left message for patient notifying that rx has been sent.

## 2016-02-27 NOTE — Telephone Encounter (Signed)
Went to Urgent Care Last Week for a Sinus Infection was and she was put on a strong antibiotic and she is stating that it is causing her to have a major yeast infection and she is asking for a 3 day course of Diflucan for the problem, please advise?

## 2016-02-27 NOTE — Telephone Encounter (Signed)
Is this ok to give 3 Diflucan?

## 2016-02-27 NOTE — Telephone Encounter (Signed)
2 sent in tell her one only is required, so 2 is the max sent , encourage OTC monistat as an  add on ,if she feels she needs this this cures yeast infection just as well

## 2016-03-07 ENCOUNTER — Ambulatory Visit (HOSPITAL_COMMUNITY): Payer: Self-pay | Admitting: Psychiatry

## 2016-07-10 ENCOUNTER — Encounter: Payer: Self-pay | Admitting: Family Medicine

## 2016-07-10 ENCOUNTER — Ambulatory Visit (INDEPENDENT_AMBULATORY_CARE_PROVIDER_SITE_OTHER): Payer: PRIVATE HEALTH INSURANCE | Admitting: Family Medicine

## 2016-07-10 ENCOUNTER — Other Ambulatory Visit (HOSPITAL_COMMUNITY)
Admission: RE | Admit: 2016-07-10 | Discharge: 2016-07-10 | Disposition: A | Discharge status: Home / Self Care | Payer: PRIVATE HEALTH INSURANCE | Source: Ambulatory Visit | Attending: Family Medicine | Admitting: Family Medicine

## 2016-07-10 ENCOUNTER — Telehealth: Payer: Self-pay | Admitting: Family Medicine

## 2016-07-10 VITALS — BP 120/82 | HR 87 | Resp 16 | Ht 61.0 in | Wt 138.0 lb

## 2016-07-10 DIAGNOSIS — F5102 Adjustment insomnia: Secondary | ICD-10-CM | POA: Diagnosis not present

## 2016-07-10 DIAGNOSIS — Z1211 Encounter for screening for malignant neoplasm of colon: Secondary | ICD-10-CM | POA: Diagnosis not present

## 2016-07-10 DIAGNOSIS — Z0000 Encounter for general adult medical examination without abnormal findings: Secondary | ICD-10-CM

## 2016-07-10 DIAGNOSIS — E785 Hyperlipidemia, unspecified: Secondary | ICD-10-CM | POA: Diagnosis not present

## 2016-07-10 DIAGNOSIS — Z124 Encounter for screening for malignant neoplasm of cervix: Secondary | ICD-10-CM

## 2016-07-10 DIAGNOSIS — Z01419 Encounter for gynecological examination (general) (routine) without abnormal findings: Secondary | ICD-10-CM | POA: Diagnosis present

## 2016-07-10 DIAGNOSIS — I10 Essential (primary) hypertension: Secondary | ICD-10-CM

## 2016-07-10 DIAGNOSIS — F411 Generalized anxiety disorder: Secondary | ICD-10-CM | POA: Diagnosis not present

## 2016-07-10 DIAGNOSIS — Z1151 Encounter for screening for human papillomavirus (HPV): Secondary | ICD-10-CM | POA: Diagnosis not present

## 2016-07-10 DIAGNOSIS — E559 Vitamin D deficiency, unspecified: Secondary | ICD-10-CM

## 2016-07-10 LAB — POC HEMOCCULT BLD/STL (OFFICE/1-CARD/DIAGNOSTIC): Fecal Occult Blood, POC: NEGATIVE

## 2016-07-10 MED ORDER — TEMAZEPAM 15 MG PO CAPS: 15.0000 mg | ORAL_CAPSULE | Freq: Every evening | ORAL | 3 refills | Status: DC | PRN

## 2016-07-10 NOTE — Assessment & Plan Note (Signed)
Reports klonopin once weekly at most, will not refill, behav modification only

## 2016-07-10 NOTE — Assessment & Plan Note (Signed)

## 2016-07-10 NOTE — Patient Instructions (Addendum)
Annual exam in 12 months, call if you need me before  Labs today  Sleep aid with good sleep habits short term, aim to wean off in 3 months  No more medication for anxiety, don't need this, behavioral modification   Work on exercise , weight loss , and plant based eating to improve blood pressure, your bP is not normal  Thank you  for choosing [facility]. We consider it a privelige to serve you.  Delivering excellent health care in a caring and  compassionate way is our goal.  Partnering with you,  so that together we can achieve this goal is our strategy.

## 2016-07-10 NOTE — Assessment & Plan Note (Signed)
Uncontrolled, sleep hygiene reviewed, recent shift in work hours  resume restoril, 4 months only , as needed, use

## 2016-07-10 NOTE — Telephone Encounter (Signed)
pls let her know labs have been returned and reviewed, all excellent, except total cholesterol elevated at 234 and LDL elevated at at 128, so need to reduce fried and fatty foods, more vege and fruit!

## 2016-07-11 NOTE — Telephone Encounter (Signed)
Sent via mychart

## 2016-07-11 NOTE — Progress Notes (Signed)
    [name] [name]     MRN: [id number]      DOB: 05-Jun-1971  HPI: Patient is in for annual physical exam. C/o difficulty with sleep despite excellent health habits, now has day shift x 3 months, had worked nights for years Recent labs, if available are reviewed. Immunization is reviewed , and  updated if needed.   PE: Pleasant  female, alert and oriented x 3, in no cardio-pulmonary distress. Afebrile. HEENT No facial trauma or asymetry. Sinuses non tender.  Extra occullar muscles intact, pupils equally reactive to light. External ears normal, tympanic membranes clear. Oropharynx moist, no exudate. Neck: supple, no adenopathy,JVD or thyromegaly.No bruits.  Chest: Clear to ascultation bilaterally.No crackles or wheezes. Non tender to palpation  Breast: No asymetry,no masses or lumps. No tenderness. No nipple discharge or inversion. No axillary or supraclavicular adenopathy  Cardiovascular system; Heart sounds normal,  S1 and  S2 ,no S3.  No murmur, or thrill. Apical beat not displaced Peripheral pulses normal.  Abdomen: Soft, non tender, no organomegaly or masses. No bruits. Bowel sounds normal. No guarding, tenderness or rebound.  Rectal:  Normal sphincter tone. No rectal mass. Guaiac negative stool.  GU: External genitalia normal female genitalia , normal female distribution of hair. No lesions. Urethral meatus normal in size, no  Prolapse, no lesions visibly  Present. Bladder non tender. Vagina pink and moist , with no visible lesions , discharge present . Adequate pelvic support no  cystocele or rectocele noted Cervix pink and appears healthy, no lesions or ulcerations noted, no discharge noted from os Uterus normal size, no adnexal masses, no cervical motion or adnexal tenderness.   Musculoskeletal exam: Full ROM of spine, hips , shoulders and knees. No deformity ,swelling or crepitus noted. No muscle wasting or atrophy.   Neurologic: Cranial nerves 2  to 12 intact. Power, tone ,sensation and reflexes normal throughout. No disturbance in gait. No tremor.  Skin: Intact, no ulceration, erythema , scaling or rash noted. Pigmentation normal throughout  Psych; Normal mood and affect. Judgement and concentration normal   Assessment & Plan:  Annual physical exam Annual exam as documented. Counseling done  re healthy lifestyle involving commitment to 150 minutes exercise per week, heart healthy diet, and attaining healthy weight.The importance of adequate sleep also discussed. Regular seat belt use and home safety, is also discussed. Changes in health habits are decided on by the patient with goals and time frames  set for achieving them. Immunization and cancer screening needs are specifically addressed at this visit.   Insomnia Uncontrolled, sleep hygiene reviewed, recent shift in work hours  resume restoril, 4 months only , as needed, use  Generalized anxiety disorder Reports klonopin once weekly at most, will not refill, behav modification only

## 2016-07-12 LAB — CYTOLOGY - PAP
Diagnosis: NEGATIVE
HPV: NOT DETECTED

## 2016-07-25 ENCOUNTER — Encounter: Payer: Self-pay | Admitting: Family Medicine

## 2016-07-26 ENCOUNTER — Other Ambulatory Visit (HOSPITAL_COMMUNITY): Payer: Self-pay | Admitting: Psychiatry

## 2016-07-30 ENCOUNTER — Telehealth: Payer: Self-pay

## 2016-07-30 ENCOUNTER — Other Ambulatory Visit: Payer: Self-pay

## 2016-07-30 ENCOUNTER — Other Ambulatory Visit: Payer: Self-pay | Admitting: Family Medicine

## 2016-07-30 ENCOUNTER — Telehealth (HOSPITAL_COMMUNITY): Payer: Self-pay | Admitting: *Deleted

## 2016-07-30 MED ORDER — PREDNISONE 5 MG (21) PO TBPK: ORAL_TABLET | ORAL | 0 refills | Status: DC

## 2016-07-30 NOTE — Telephone Encounter (Signed)
No refill until seen

## 2016-07-30 NOTE — Telephone Encounter (Signed)
Pt pharmacy requesting refills for pt Klonopin 1 mg BID via e-scribe and fax. Per pt chart, this medications was stopped on 07-10-16. Pt last saw Dr. Harrington Challenger for f/u on 11-30-2015. Pt do not have scheduled f/u appt. Pt pharmacy number is [phone].

## 2016-07-30 NOTE — Telephone Encounter (Signed)
How often will she need to take the klonopin, is her anxiety increased short term?  Pls send in pred  5 mg dose pack x 6 days only, tx

## 2016-07-30 NOTE — Telephone Encounter (Signed)
Klonopin entered historically pls let her know max of 4 tabs per week, to be used only for extreme anxiety or panic, tenty tabs with 1 refill entered to last 10 weeks, pls send

## 2016-07-30 NOTE — Telephone Encounter (Signed)
Patient states that she only uses medication occasionally.

## 2016-07-30 NOTE — Telephone Encounter (Signed)
Last fill was in June.

## 2016-07-30 NOTE — Telephone Encounter (Signed)
noted 

## 2016-07-31 MED ORDER — CLONAZEPAM 1 MG PO TABS: 1.0000 mg | ORAL_TABLET | Freq: Two times a day (BID) | ORAL | 1 refills | Status: DC | PRN

## 2016-07-31 NOTE — Telephone Encounter (Signed)
Patient aware and medication ready for signature.

## 2016-08-01 ENCOUNTER — Encounter: Payer: Self-pay | Admitting: Family Medicine

## 2016-08-01 ENCOUNTER — Ambulatory Visit (INDEPENDENT_AMBULATORY_CARE_PROVIDER_SITE_OTHER): Payer: PRIVATE HEALTH INSURANCE | Admitting: Family Medicine

## 2016-08-01 VITALS — BP 126/82 | HR 100 | Temp 99.4°F | Resp 18 | Wt 134.0 lb

## 2016-08-01 DIAGNOSIS — F5101 Primary insomnia: Secondary | ICD-10-CM

## 2016-08-01 DIAGNOSIS — J209 Acute bronchitis, unspecified: Secondary | ICD-10-CM | POA: Diagnosis not present

## 2016-08-01 DIAGNOSIS — F411 Generalized anxiety disorder: Secondary | ICD-10-CM | POA: Diagnosis not present

## 2016-08-01 DIAGNOSIS — J0181 Other acute recurrent sinusitis: Secondary | ICD-10-CM

## 2016-08-01 DIAGNOSIS — I10 Essential (primary) hypertension: Secondary | ICD-10-CM

## 2016-08-01 MED ORDER — BENZONATATE 100 MG PO CAPS: 100.0000 mg | ORAL_CAPSULE | Freq: Two times a day (BID) | ORAL | 0 refills | Status: DC | PRN

## 2016-08-01 MED ORDER — PROMETHAZINE-DM 6.25-15 MG/5ML PO SYRP: ORAL_SOLUTION | ORAL | 0 refills | Status: DC

## 2016-08-01 MED ORDER — TEMAZEPAM 15 MG PO CAPS: 15.0000 mg | ORAL_CAPSULE | Freq: Every evening | ORAL | 3 refills | Status: DC | PRN

## 2016-08-01 MED ORDER — LEVOFLOXACIN 500 MG PO TABS: 500.0000 mg | ORAL_TABLET | Freq: Every day | ORAL | 0 refills | Status: DC

## 2016-08-01 MED ORDER — FLUCONAZOLE 150 MG PO TABS: ORAL_TABLET | ORAL | 0 refills | Status: DC

## 2016-08-01 NOTE — Assessment & Plan Note (Signed)
Sleep hygiene reviewed and written information offered also. Prescription sent for  medication needed.  

## 2016-08-01 NOTE — Assessment & Plan Note (Signed)
Decongestant, cough suppressant and antibiotic prescribed 

## 2016-08-01 NOTE — Assessment & Plan Note (Signed)
Antibiotic, fluids, rest, and sick leave x 1 week

## 2016-08-01 NOTE — Patient Instructions (Addendum)
F/u as before  You are treated for acute sinsurtis and bronchitis. Levaquin, tessalon perles, phenergan DM and fluconazole have been prescribed and sent to your pharmacy  Klonopin and temazepam have already been sent days ago for anxiety / panic and sleep  Work excuse from 12/4 to return 08/06/2016  Fluids, and rest , take meds as prescribed    Sinusitis, Adult Sinusitis is soreness and inflammation of your sinuses. Sinuses are hollow spaces in the bones around your face. They are located:  Around your eyes.  In the middle of your forehead.  Behind your nose.  In your cheekbones. Your sinuses and nasal passages are lined with a stringy fluid (mucus). Mucus normally drains out of your sinuses. When your nasal tissues get inflamed or swollen, the mucus can get trapped or blocked so air cannot flow through your sinuses. This lets bacteria, viruses, and funguses grow, and that leads to infection. Follow these instructions at home: Medicines  Take, use, or apply over-the-counter and prescription medicines only as told by your doctor. These may include nasal sprays.  If you were prescribed an antibiotic medicine, take it as told by your doctor. Do not stop taking the antibiotic even if you start to feel better. Hydrate and Humidify  Drink enough water to keep your pee (urine) clear or pale yellow.  Use a cool mist humidifier to keep the humidity level in your home above 50%.  Breathe in steam for 10-15 minutes, 3-4 times a day or as told by your doctor. You can do this in the bathroom while a hot shower is running.  Try not to spend time in cool or dry air. Rest  Rest as much as possible.  Sleep with your head raised (elevated).  Make sure to get enough sleep each night. General instructions  Put a warm, moist washcloth on your face 3-4 times a day or as told by your doctor. This will help with discomfort.  Wash your hands often with soap and water. If there is no soap and  water, use hand sanitizer.  Do not smoke. Avoid being around people who are smoking (secondhand smoke).  Keep all follow-up visits as told by your doctor. This is important. Contact a doctor if:  You have a fever.  Your symptoms get worse.  Your symptoms do not get better within 10 days. Get help right away if:  You have a very bad headache.  You cannot stop throwing up (vomiting).  You have pain or swelling around your face or eyes.  You have trouble seeing.  You feel confused.  Your neck is stiff.  You have trouble breathing. This information is not intended to replace advice given to you by your health care provider. Make sure you discuss any questions you have with your health care provider. Document Released: 01/30/2008 Document Revised: 04/08/2016 Document Reviewed: 06/08/2015 Elsevier Interactive Patient Education  2017 Elsevier Inc.  Acute Bronchitis, Adult Acute bronchitis is when air tubes (bronchi) in the lungs suddenly get swollen. The condition can make it hard to breathe. It can also cause these symptoms:  A cough.  Coughing up clear, yellow, or green mucus.  Wheezing.  Chest congestion.  Shortness of breath.  A fever.  Body aches.  Chills.  A sore throat. Follow these instructions at home: Medicines  Take over-the-counter and prescription medicines only as told by your doctor.  If you were prescribed an antibiotic medicine, take it as told by your doctor. Do not stop taking the  antibiotic even if you start to feel better. General instructions  Rest.  Drink enough fluids to keep your pee (urine) clear or pale yellow.  Avoid smoking and secondhand smoke. If you smoke and you need help quitting, ask your doctor. Quitting will help your lungs heal faster.  Use an inhaler, cool mist vaporizer, or humidifier as told by your doctor.  Keep all follow-up visits as told by your doctor. This is important. How is this prevented? To lower your  risk of getting this condition again:  Wash your hands often with soap and water. If you cannot use soap and water, use hand sanitizer.  Avoid contact with people who have cold symptoms.  Try not to touch your hands to your mouth, nose, or eyes.  Make sure to get the flu shot every year. Contact a doctor if:  Your symptoms do not get better in 2 weeks. Get help right away if:  You cough up blood.  You have chest pain.  You have very bad shortness of breath.  You become dehydrated.  You faint (pass out) or keep feeling like you are going to pass out.  You keep throwing up (vomiting).  You have a very bad headache.  Your fever or chills gets worse. This information is not intended to replace advice given to you by your health care provider. Make sure you discuss any questions you have with your health care provider. Document Released: 01/30/2008 Document Revised: 03/21/2016 Document Reviewed: 02/01/2016 Elsevier Interactive Patient Education  2017 [employer].

## 2016-08-01 NOTE — Assessment & Plan Note (Signed)
Controlled, no change in medication DASH diet and commitment to daily physical activity for a minimum of 30 minutes discussed and encouraged, as a part of hypertension management. The importance of attaining a healthy weight is also discussed.  BP/Weight 08/01/2016 07/10/2016 12/06/2015 11/10/2015 02/09/2015 123XX123 Q000111Q  Systolic BP 123XX123 123456 0000000 [id number] 123456 [id number]  Diastolic BP 82 82 81 80 70 90 74  Wt. (Lbs) 134 138 - 143 143 140 141  BMI 25.32 26.07 - 27.03 27.71 26.47 26.66  Some encounter information is confidential and restricted. Go to Review Flowsheets activity to see all data.

## 2016-08-01 NOTE — Progress Notes (Signed)
   [name] [name]     MRN: [id number]      DOB: 08-Aug-1971   HPI [name] [name]  4 day h/o worsening head and chest congestion, associated with fever and chills intermittently. Nasal drainage has thickened , and is yellowish green, and at times bloody. Sputum is thick and yellow. C/o bilateral ear pressure, denies hearing loss and sore throat. Increasing fatigue , poor appetitie and sleep disturbed by cough. No improvement with OTC medication.   ROS  Denies chest pains, palpitations and leg swelling Denies abdominal pain, nausea, vomiting,diarrhea or constipation.   Denies dysuria, frequency, hesitancy or incontinence. Denies joint pain, swelling and limitation in mobility. Denies headaches, seizures, numbness, or tingling. Denies depression, anxiety or insomnia. Denies skin break down or rash.   PE  BP 126/82   Pulse 100   Temp 99.4 F (37.4 C)   Resp 18   Wt 134 lb (60.8 kg)   SpO2 98%   BMI 25.32 kg/m   Patient alert and oriented and in no cardiopulmonary distress.  HEENT: No facial asymmetry, EOMI,   oropharynx pink and moist.  Neck supple no JVD, no mass.Maxillary sinuses are tender, bilateral cervical adenitis  Chest: Adequate but decreased air entry , bilateral crackles, no wheezes.  CVS: S1, S2 no murmurs, no S3.Regular rate.  ABD: Soft non tender.   Ext: No edema  MS: Adequate ROM spine, shoulders, hips and knees.  Skin: Intact, no ulcerations or rash noted.  Psych: Good eye contact, normal affect. Memory intact not anxious or depressed appearing.  CNS: CN 2-12 intact, power,  normal throughout.no focal deficits noted.   Assessment & Plan  Acute bronchitis Decongestant, cough suppressant and antibiotic prescribed  Acute sinus infection Antibiotic, fluids, rest, and sick leave x 1 week  Generalized anxiety disorder Reports increased anxiety and panic and is maintained on intermittent klonopin  Insomnia Sleep hygiene reviewed and written  information offered also. Prescription sent for  medication needed.   Benign essential HTN Controlled, no change in medication DASH diet and commitment to daily physical activity for a minimum of 30 minutes discussed and encouraged, as a part of hypertension management. The importance of attaining a healthy weight is also discussed.  BP/Weight 08/01/2016 07/10/2016 12/06/2015 11/10/2015 02/09/2015 123XX123 [id number]  Systolic BP 123XX123 123456 0000000 [id number] 123456 [id number]  Diastolic BP 82 82 81 80 70 90 74  Wt. (Lbs) 134 138 - 143 143 140 141  BMI 25.32 26.07 - 27.03 27.71 26.47 26.66  Some encounter information is confidential and restricted. Go to Review Flowsheets activity to see all data.

## 2016-08-01 NOTE — Assessment & Plan Note (Signed)
Reports increased anxiety and panic and is maintained on intermittent klonopin

## 2016-08-02 ENCOUNTER — Other Ambulatory Visit: Payer: Self-pay | Admitting: Adult Health

## 2016-08-13 ENCOUNTER — Other Ambulatory Visit: Payer: Self-pay

## 2016-08-13 MED ORDER — PANTOPRAZOLE SODIUM 40 MG PO TBEC: 40.0000 mg | DELAYED_RELEASE_TABLET | Freq: Every day | ORAL | 0 refills | Status: DC

## 2016-10-26 ENCOUNTER — Other Ambulatory Visit (HOSPITAL_COMMUNITY)
Admission: RE | Admit: 2016-10-26 | Discharge: 2016-10-26 | Disposition: A | Discharge status: Home / Self Care | Payer: PRIVATE HEALTH INSURANCE | Source: Ambulatory Visit | Attending: Family Medicine | Admitting: Family Medicine

## 2016-10-26 ENCOUNTER — Ambulatory Visit: Payer: PRIVATE HEALTH INSURANCE

## 2016-10-26 ENCOUNTER — Telehealth: Payer: Self-pay | Admitting: Family Medicine

## 2016-10-26 DIAGNOSIS — N760 Acute vaginitis: Secondary | ICD-10-CM | POA: Insufficient documentation

## 2016-10-26 NOTE — Telephone Encounter (Signed)
Patient in to submit urine

## 2016-10-26 NOTE — Progress Notes (Signed)
Patient c/o vaginal discharge x 3 days.  States that she has problem yearly.  Urine collected and sent for testing for BV, yeast, and std testing.   Patient aware.

## 2016-10-26 NOTE — Telephone Encounter (Signed)
Left message on times to come in to leave specimen

## 2016-10-26 NOTE — Telephone Encounter (Signed)
[name] [name] is asking if Dr. Moshe Cipro would call her in something for bacterial vaginitis, please advise?

## 2016-10-26 NOTE — Telephone Encounter (Signed)
Itching, odor, knows that's what it is. Please advise

## 2016-10-26 NOTE — Telephone Encounter (Signed)
pls advise OTC monistat as directed for "itch" as this treats yeast, and she may submit urine for testing for STD/ vaginal infection, will treat definitively next week when result is available

## 2016-10-29 ENCOUNTER — Telehealth: Payer: Self-pay | Admitting: Family Medicine

## 2016-10-29 ENCOUNTER — Other Ambulatory Visit: Payer: Self-pay | Admitting: Family Medicine

## 2016-10-29 LAB — URINE CYTOLOGY ANCILLARY ONLY
Chlamydia: NEGATIVE
Neisseria Gonorrhea: NEGATIVE

## 2016-10-29 MED ORDER — METRONIDAZOLE 500 MG PO TABS: 500.0000 mg | ORAL_TABLET | Freq: Two times a day (BID) | ORAL | 0 refills | Status: DC

## 2016-10-29 NOTE — Telephone Encounter (Signed)
flagyll is sent to [facility] pls let her know

## 2016-10-29 NOTE — Telephone Encounter (Signed)
Pt aware,

## 2016-10-29 NOTE — Telephone Encounter (Signed)
Urine submitted Friday but may not be available before mid week

## 2016-10-29 NOTE — Telephone Encounter (Signed)
[name] [name] is calling asking for medication for her bacterial vaginitis she states she is 100% positive this is whats going on with her, please advise?

## 2016-11-01 ENCOUNTER — Encounter: Payer: Self-pay | Admitting: Adult Health

## 2016-11-01 ENCOUNTER — Encounter: Payer: Self-pay | Admitting: Family Medicine

## 2016-11-01 ENCOUNTER — Ambulatory Visit (INDEPENDENT_AMBULATORY_CARE_PROVIDER_SITE_OTHER): Payer: PRIVATE HEALTH INSURANCE | Admitting: Adult Health

## 2016-11-01 VITALS — BP 118/78 | HR 84 | Ht 61.0 in | Wt 137.0 lb

## 2016-11-01 DIAGNOSIS — B9689 Other specified bacterial agents as the cause of diseases classified elsewhere: Secondary | ICD-10-CM

## 2016-11-01 DIAGNOSIS — N898 Other specified noninflammatory disorders of vagina: Secondary | ICD-10-CM

## 2016-11-01 DIAGNOSIS — N760 Acute vaginitis: Secondary | ICD-10-CM

## 2016-11-01 LAB — POCT WET PREP (WET MOUNT)
Clue Cells Wet Prep Whiff POC: POSITIVE
WBC, Wet Prep HPF POC: POSITIVE

## 2016-11-01 LAB — URINE CYTOLOGY ANCILLARY ONLY: Candida vaginitis: NEGATIVE

## 2016-11-01 MED ORDER — METRONIDAZOLE 500 MG PO TABS: 500.0000 mg | ORAL_TABLET | Freq: Two times a day (BID) | ORAL | 2 refills | Status: DC

## 2016-11-01 NOTE — Progress Notes (Signed)
Subjective:     Patient ID: [name] [name], female   DOB: 1970/11/12, 46 y.o.   MRN: [id number]  HPI [name] [name] is a 46 year old black female in complaining of vaginal discharge and odor for about a week.Had pap with Dr Moshe Cipro, her PCP.  Review of Systems +vaginal discharge with odor  Reviewed past medical,surgical, social and family history. Reviewed medications and allergies.     Objective:   Physical Exam BP 118/78 (BP Location: Left Arm, Patient Position: Sitting, Cuff Size: Normal)   Pulse 84   Ht 5\' 1"  (1.549 m)   Wt 137 lb (62.1 kg)   LMP 10/28/2016 (Exact Date)   BMI 25.89 kg/m  Skin warm and dry.Pelvic: external genitalia is normal in appearance no lesions, vagina: white discharge with odor,urethra has no lesions or masses noted, cervix:smooth and bulbous, uterus: normal size, shape and contour, non tender, no masses felt, adnexa: no masses or tenderness noted. Bladder is non tender and no masses felt. Wet prep: + for clue cells and +WBCs. GC/CHL obtained.     Assessment:     1. Vaginal discharge   2. Vaginal odor   3. BV (bacterial vaginosis)       Plan:    GC/CHL sent Meds ordered this encounter  Medications  . metroNIDAZOLE (FLAGYL) 500 MG tablet    Sig: Take 1 tablet (500 mg total) by mouth 2 (two) times daily.    Dispense:  14 tablet    Refill:  2    Order Specific Question:   Supervising Provider    Answer:   Tania Ade H [[id number]]  No thongs, or douching, limit tub baths Follow up prn

## 2016-11-01 NOTE — Patient Instructions (Signed)
Bacterial Vaginosis Bacterial vaginosis is a vaginal infection that occurs when the normal balance of bacteria in the vagina is disrupted. It results from an overgrowth of certain bacteria. This is the most common vaginal infection among women ages 15-44. Because bacterial vaginosis increases your risk for STIs (sexually transmitted infections), getting treated can help reduce your risk for chlamydia, gonorrhea, herpes, and HIV (human immunodeficiency virus). Treatment is also important for preventing complications in pregnant women, because this condition can cause an early (premature) delivery. What are the causes? This condition is caused by an increase in harmful bacteria that are normally present in small amounts in the vagina. However, the reason that the condition develops is not fully understood. What increases the risk? The following factors may make you more likely to develop this condition:  Having a new sexual partner or multiple sexual partners.  Having unprotected sex.  Douching.  Having an intrauterine device (IUD).  Smoking.  Drug and alcohol abuse.  Taking certain antibiotic medicines.  Being pregnant.  You cannot get bacterial vaginosis from toilet seats, bedding, swimming pools, or contact with objects around you. What are the signs or symptoms? Symptoms of this condition include:  Grey or white vaginal discharge. The discharge can also be watery or foamy.  A fish-like odor with discharge, especially after sexual intercourse or during menstruation.  Itching in and around the vagina.  Burning or pain with urination.  Some women with bacterial vaginosis have no signs or symptoms. How is this diagnosed? This condition is diagnosed based on:  Your medical history.  A physical exam of the vagina.  Testing a sample of vaginal fluid under a microscope to look for a large amount of bad bacteria or abnormal cells. Your health care provider may use a cotton swab  or a small wooden spatula to collect the sample.  How is this treated? This condition is treated with antibiotics. These may be given as a pill, a vaginal cream, or a medicine that is put into the vagina (suppository). If the condition comes back after treatment, a second round of antibiotics may be needed. Follow these instructions at home: Medicines  Take over-the-counter and prescription medicines only as told by your health care provider.  Take or use your antibiotic as told by your health care provider. Do not stop taking or using the antibiotic even if you start to feel better. General instructions  If you have a female sexual partner, tell her that you have a vaginal infection. She should see her health care provider and be treated if she has symptoms. If you have a female sexual partner, he does not need treatment.  During treatment: ? Avoid sexual activity until you finish treatment. ? Do not douche. ? Avoid alcohol as directed by your health care provider. ? Avoid breastfeeding as directed by your health care provider.  Drink enough water and fluids to keep your urine clear or pale yellow.  Keep the area around your vagina and rectum clean. ? Wash the area daily with warm water. ? Wipe yourself from front to back after using the toilet.  Keep all follow-up visits as told by your health care provider. This is important. How is this prevented?  Do not douche.  Wash the outside of your vagina with warm water only.  Use protection when having sex. This includes latex condoms and dental dams.  Limit how many sexual partners you have. To help prevent bacterial vaginosis, it is best to have sex with just   one partner (monogamous).  Make sure you and your sexual partner are tested for STIs.  Wear cotton or cotton-lined underwear.  Avoid wearing tight pants and pantyhose, especially during summer.  Limit the amount of alcohol that you drink.  Do not use any products that  contain nicotine or tobacco, such as cigarettes and e-cigarettes. If you need help quitting, ask your health care provider.  Do not use illegal drugs. Where to find more information:  Centers for Disease Control and Prevention: www.cdc.gov/std  American Sexual Health Association (ASHA): www.ashastd.org  U.S. Department of Health and Human Services, Office on Women's Health: www.womenshealth.gov/ or https://www.womenshealth.gov/a-z-topics/bacterial-vaginosis Contact a health care provider if:  Your symptoms do not improve, even after treatment.  You have more discharge or pain when urinating.  You have a fever.  You have pain in your abdomen.  You have pain during sex.  You have vaginal bleeding between periods. Summary  Bacterial vaginosis is a vaginal infection that occurs when the normal balance of bacteria in the vagina is disrupted.  Because bacterial vaginosis increases your risk for STIs (sexually transmitted infections), getting treated can help reduce your risk for chlamydia, gonorrhea, herpes, and HIV (human immunodeficiency virus). Treatment is also important for preventing complications in pregnant women, because the condition can cause an early (premature) delivery.  This condition is treated with antibiotic medicines. These may be given as a pill, a vaginal cream, or a medicine that is put into the vagina (suppository). This information is not intended to replace advice given to you by your health care provider. Make sure you discuss any questions you have with your health care provider. Document Released: 08/13/2005 Document Revised: 04/28/2016 Document Reviewed: 04/28/2016 Elsevier Interactive Patient Education  2017 Elsevier Inc.  

## 2016-11-03 LAB — GC/CHLAMYDIA PROBE AMP
Chlamydia trachomatis, NAA: NEGATIVE
Neisseria gonorrhoeae by PCR: NEGATIVE

## 2016-11-05 ENCOUNTER — Telehealth: Payer: Self-pay | Admitting: *Deleted

## 2016-11-05 MED ORDER — METRONIDAZOLE 500 MG PO TABS: 500.0000 mg | ORAL_TABLET | Freq: Two times a day (BID) | ORAL | 2 refills | Status: DC

## 2016-11-05 NOTE — Telephone Encounter (Signed)
Pt said [facility]  said they did not get refill flagyl and she is aware that GC/CHL negative.

## 2017-01-22 ENCOUNTER — Telehealth: Payer: Self-pay | Admitting: Family Medicine

## 2017-01-22 ENCOUNTER — Other Ambulatory Visit: Payer: Self-pay

## 2017-01-22 ENCOUNTER — Other Ambulatory Visit: Payer: Self-pay | Admitting: Family Medicine

## 2017-01-22 MED ORDER — PANTOPRAZOLE SODIUM 40 MG PO TBEC: 40.0000 mg | DELAYED_RELEASE_TABLET | Freq: Every day | ORAL | 0 refills | Status: DC

## 2017-01-22 NOTE — Telephone Encounter (Signed)
Refill sent in

## 2017-01-22 NOTE — Telephone Encounter (Signed)
Patient is requesting a refill of pantoprazole She has switched pharmacies for insurance purposes, she now uses [facility] in [place]  Cb#:[phone]

## 2017-02-18 ENCOUNTER — Telehealth: Payer: Self-pay | Admitting: *Deleted

## 2017-02-18 ENCOUNTER — Other Ambulatory Visit: Payer: Self-pay | Admitting: Family Medicine

## 2017-02-18 DIAGNOSIS — I10 Essential (primary) hypertension: Secondary | ICD-10-CM

## 2017-02-18 DIAGNOSIS — E785 Hyperlipidemia, unspecified: Secondary | ICD-10-CM

## 2017-02-18 MED ORDER — FLUCONAZOLE 150 MG PO TABS: 150.0000 mg | ORAL_TABLET | Freq: Once | ORAL | 0 refills | Status: AC

## 2017-02-18 NOTE — Telephone Encounter (Signed)
Patient aware and faxed over her lab order

## 2017-02-18 NOTE — Telephone Encounter (Signed)
Patient called left message stating she has a yeast infection and she needs something called in for it, patient states she has also started her period. Please call patient [phone]

## 2017-02-18 NOTE — Telephone Encounter (Signed)
pls lket her know that med is sent, needs physical exam scheduled fpor ende nov and has not had labs for 2 years nad her lipids were very highh, needs all fasting labs, CBC, lipid, chem 7 1 week prior to the visit  Done pls, also mammo past due let her  know

## 2017-02-18 NOTE — Telephone Encounter (Signed)
Patient has called back stating her yeast infection started Friday and she is at work today and it is really bad, patient would like something called into [facility].

## 2017-02-18 NOTE — Telephone Encounter (Signed)
Pt on menstrual and c/o itching and states she has a yeast infection and wants to know if a diflucan can be sent in for her

## 2017-02-18 NOTE — Telephone Encounter (Signed)
Patient aware.

## 2017-02-19 ENCOUNTER — Encounter: Payer: Self-pay | Admitting: Family Medicine

## 2017-03-21 ENCOUNTER — Ambulatory Visit (INDEPENDENT_AMBULATORY_CARE_PROVIDER_SITE_OTHER): Payer: Commercial Managed Care - PPO | Admitting: Obstetrics and Gynecology

## 2017-03-21 ENCOUNTER — Encounter: Payer: Self-pay | Admitting: Obstetrics and Gynecology

## 2017-03-21 VITALS — BP 112/68 | HR 74 | Wt 135.8 lb

## 2017-03-21 DIAGNOSIS — B3731 Acute candidiasis of vulva and vagina: Secondary | ICD-10-CM | POA: Insufficient documentation

## 2017-03-21 DIAGNOSIS — B373 Candidiasis of vulva and vagina: Secondary | ICD-10-CM | POA: Insufficient documentation

## 2017-03-21 LAB — POCT WET PREP (WET MOUNT)
KOH Wet Prep POC: POSITIVE
Trichomonas Wet Prep HPF POC: ABSENT

## 2017-03-21 MED ORDER — FLUCONAZOLE 150 MG PO TABS: 150.0000 mg | ORAL_TABLET | Freq: Once | ORAL | 3 refills | Status: AC

## 2017-03-21 NOTE — Addendum Note (Signed)
Addended by: Gaylyn Rong A on: 03/21/2017 03:32 PM   Modules accepted: Orders

## 2017-03-21 NOTE — Progress Notes (Signed)
[facility] Visit  03/21/2017            Patient name: [name] [name] MRN [id number]  Date of birth: 1971/05/11  CC & HPI:  [name] [name] is a 46 y.o. female presenting today for a yeast infection with onset about 1 week ago. Pt had a yeast infection in May, and took [place], and again in June (treated with pills). She states it happens right before her period comes. No alleviating factors noted.  Patient's last menstrual period was 03/17/2017.   ROS:  ROS -fever -chills  Pertinent History Reviewed:   Reviewed: Significant for abnormal pap, BV, dysmenorrhea, laparoscopic bilateral salpingectomy Medical         Past Medical History:  Diagnosis Date  . Abnormal vaginal Pap smear    Dr Glo Herring  . Allergic rhinitis   . BV (bacterial vaginosis) 02/09/2015  . Fibroids 10/28/2013  . Hyperlipidemia    x4 years   . Hypertension    whenshe wasa young but she changed diet and reducd salt intake   . Irregular intermenstrual bleeding 10/28/2013  . Kidney stones   . Mental disorder   . Unspecified symptom associated with female genital organs 10/28/2013  . Vaginal discharge 02/09/2015  . Vaginal odor 02/09/2015  . Vaginal Pap smear, abnormal                               Surgical Hx:    Past Surgical History:  Procedure Laterality Date  . BREAST REDUCTION SURGERY  Jan 2007  . CERVICAL BIOPSY  2010   for abnormal pap   . cyst removed from both breast , all benign both breast    . DILITATION & CURRETTAGE/HYSTROSCOPY WITH THERMACHOICE ABLATION N/A 12/15/2013   Procedure: DILATATION & CURETTAGE/HYSTEROSCOPY WITH THERMACHOICE ABLATION;  Surgeon: Jonnie Kind, MD;  Location: [facility];  Service: Gynecology;  Laterality: N/A;  total therapy time:87min 1sec D5W  12ml in, D5W  74ml out Temperature 87 degree c.  . EXCISION OF SKIN TAG Right 12/15/2013   Procedure: EXCISION OF SKIN TAG;  Surgeon: Jonnie Kind, MD;  Location: [facility];  Service: Gynecology;  Laterality: Right;  .  LAPAROSCOPIC BILATERAL SALPINGECTOMY Bilateral 12/15/2013   Procedure: LAPAROSCOPIC BILATERAL SALPINGECTOMY;  Surgeon: Jonnie Kind, MD;  Location: [facility];  Service: Gynecology;  Laterality: Bilateral;  . REMOVAL OF NON VAGINAL CONTRACEPTIVE DEVICE Left 12/15/2013   Procedure: REMOVAL OF NON VAGINAL CONTRACEPTIVE [place];  Surgeon: Jonnie Kind, MD;  Location: [facility];  Service: Gynecology;  Laterality: Left;   Medications: Reviewed & Updated - see associated section                       Current Outpatient Prescriptions:  .  clonazePAM (KLONOPIN) 1 MG tablet, Take 1 tablet (1 mg total) by mouth 2 (two) times daily as needed for anxiety., Disp: 20 tablet, Rfl: 1 .  mometasone (NASONEX) 50 MCG/ACT nasal spray, Place 2 sprays into the nose daily., Disp: 17 g, Rfl: 12 .  pantoprazole (PROTONIX) 40 MG tablet, Take 1 tablet (40 mg total) by mouth daily., Disp: 90 tablet, Rfl: 0 .  lansoprazole (PREVACID) 15 MG capsule, Take 15 mg by mouth daily at 12 noon., Disp: , Rfl:  .  temazepam (RESTORIL) 15 MG capsule, Take 1 capsule (15 mg total) by mouth at bedtime as needed for sleep. (Patient not taking: Reported  on 03/21/2017), Disp: 30 capsule, Rfl: 3  Current Facility-Administered Medications:  .  triamcinolone acetonide (KENALOG) 10 MG/ML injection 10 mg, 10 mg, Other, Once, [name], DPM   Social History: Reviewed -  reports that she quit smoking about 7 years ago. Her smoking use included Cigarettes. She has a 4.00 pack-year smoking history. She has never used smokeless tobacco.  Objective Findings:  Vitals: Blood pressure 112/68, pulse 74, weight 135 lb 12.8 oz (61.6 kg), last menstrual period 03/17/2017.  Physical Examination: General appearance - alert, well appearing, and in no distress Mental status - alert, oriented to person, place, and time Pelvic -  VULVA: normal appearing vulva with no masses, tenderness or lesions,  VAGINA: normal appearing vagina with normal color  and discharge, no lesions, good bladder support CERVIX: normal appearing cervix without discharge or lesions,  UTERUS: retroverted ADNEXA: normal adnexa in size, nontender and no masses  Wet [place]: KOH+ for yeast  Assessment & Plan:   A:  1. Recurrent yeast infection  P:  1. Diflucan pills with refills x3    By signing my name below, I, [name], attest that this documentation has been prepared under the direction and in the presence of Jonnie Kind, MD. Electronically Signed: Jabier Gauss, ED Scribe. 03/21/17. 2:32 PM.  I personally performed the services described in this documentation, which was SCRIBED in my presence. The recorded information has been reviewed and considered accurate. It has been edited as necessary during review. Jonnie Kind, MD

## 2017-03-29 ENCOUNTER — Telehealth: Payer: Self-pay | Admitting: Family Medicine

## 2017-03-29 NOTE — Telephone Encounter (Signed)
° °  Please add hormone lab to existing orders and fax lab order directly to patient  [phone].

## 2017-04-01 ENCOUNTER — Telehealth: Payer: Self-pay | Admitting: Obstetrics and Gynecology

## 2017-04-01 ENCOUNTER — Encounter: Payer: Self-pay | Admitting: Obstetrics and Gynecology

## 2017-04-01 ENCOUNTER — Ambulatory Visit (INDEPENDENT_AMBULATORY_CARE_PROVIDER_SITE_OTHER): Payer: Commercial Managed Care - PPO | Admitting: Obstetrics and Gynecology

## 2017-04-01 VITALS — BP 144/90 | HR 83 | Ht 61.0 in | Wt 136.6 lb

## 2017-04-01 DIAGNOSIS — Z86018 Personal history of other benign neoplasm: Secondary | ICD-10-CM | POA: Diagnosis not present

## 2017-04-01 DIAGNOSIS — N939 Abnormal uterine and vaginal bleeding, unspecified: Secondary | ICD-10-CM | POA: Diagnosis not present

## 2017-04-01 MED ORDER — MICONAZOLE NITRATE 2 % VA CREA: 1.0000 | TOPICAL_CREAM | Freq: Every day | VAGINAL | Status: DC

## 2017-04-01 MED ORDER — MEGESTROL ACETATE 40 MG PO TABS: 40.0000 mg | ORAL_TABLET | Freq: Three times a day (TID) | ORAL | 2 refills | Status: DC

## 2017-04-01 MED ORDER — FLUCONAZOLE 150 MG PO TABS: 150.0000 mg | ORAL_TABLET | Freq: Once | ORAL | 1 refills | Status: DC

## 2017-04-01 NOTE — Telephone Encounter (Signed)
Spoke with pt. Pt has known fibroids. Pt has had bleeding x 9 days. + pelvic pain in the left side. I advised she needs to be seen. To be worked in at 1:30 with Dr. Glo Herring today. Lena

## 2017-04-01 NOTE — Progress Notes (Signed)
[facility] Visit  04/01/2017            Patient name: [name] [name] MRN [id number]  Date of birth: 12-12-1970  CC & HPI:  [name] [name] is a 46 y.o. female presenting today for vagina bleeding with onset of nine days ago. Pt has hx of fibroids. Pt has associated symptoms of lower left-sided pain.  No alleviating factors noted. Pt has periods that last 4 days and are typically regular. Pt's PCP is Dr. Moshe Cipro. She wanted to discuss possible forms of menstrual control.  ROS:  ROS +lower left-sided pelvic pain +vaginal bleeding  Pertinent History Reviewed:   Reviewed: Significant for abnormal paps, fibroids, irregular menstrual bleeding Medical         Past Medical History:  Diagnosis Date  . Abnormal vaginal Pap smear    Dr Glo Herring  . Allergic rhinitis   . BV (bacterial vaginosis) 02/09/2015  . Fibroids 10/28/2013  . Hyperlipidemia    x4 years   . Hypertension    whenshe wasa young but she changed diet and reducd salt intake   . Irregular intermenstrual bleeding 10/28/2013  . Kidney stones   . Mental disorder   . Unspecified symptom associated with female genital organs 10/28/2013  . Vaginal discharge 02/09/2015  . Vaginal odor 02/09/2015  . Vaginal Pap smear, abnormal                               Surgical Hx:    Past Surgical History:  Procedure Laterality Date  . BREAST REDUCTION SURGERY  Jan 2007  . CERVICAL BIOPSY  2010   for abnormal pap   . cyst removed from both breast , all benign both breast    . DILITATION & CURRETTAGE/HYSTROSCOPY WITH THERMACHOICE ABLATION N/A 12/15/2013   Procedure: DILATATION & CURETTAGE/HYSTEROSCOPY WITH THERMACHOICE ABLATION;  Surgeon: Jonnie Kind, MD;  Location: [facility];  Service: Gynecology;  Laterality: N/A;  total therapy time:68min 1sec D5W  60ml in, D5W  62ml out Temperature 87 degree c.  . EXCISION OF SKIN TAG Right 12/15/2013   Procedure: EXCISION OF SKIN TAG;  Surgeon: Jonnie Kind, MD;  Location: [facility];  Service:  Gynecology;  Laterality: Right;  . LAPAROSCOPIC BILATERAL SALPINGECTOMY Bilateral 12/15/2013   Procedure: LAPAROSCOPIC BILATERAL SALPINGECTOMY;  Surgeon: Jonnie Kind, MD;  Location: [facility];  Service: Gynecology;  Laterality: Bilateral;  . REMOVAL OF NON VAGINAL CONTRACEPTIVE DEVICE Left 12/15/2013   Procedure: REMOVAL OF NON VAGINAL CONTRACEPTIVE [place];  Surgeon: Jonnie Kind, MD;  Location: [facility];  Service: Gynecology;  Laterality: Left;   Medications: Reviewed & Updated - see associated section                       Current Outpatient Prescriptions:  .  Cholecalciferol (VITAMIN D-3 PO), Take 50 mg by mouth. Takes 4 daily, Disp: , Rfl:  .  clonazePAM (KLONOPIN) 1 MG tablet, Take 1 tablet (1 mg total) by mouth 2 (two) times daily as needed for anxiety., Disp: 20 tablet, Rfl: 1 .  mometasone (NASONEX) 50 MCG/ACT nasal spray, Place 2 sprays into the nose daily., Disp: 17 g, Rfl: 12 .  pantoprazole (PROTONIX) 40 MG tablet, Take 1 tablet (40 mg total) by mouth daily., Disp: 90 tablet, Rfl: 0 .  temazepam (RESTORIL) 15 MG capsule, Take 1 capsule (15 mg total) by mouth at bedtime as needed for  sleep., Disp: 30 capsule, Rfl: 3 .  fluconazole (DIFLUCAN) 150 MG tablet, Take 1 tablet (150 mg total) by mouth once., Disp: 2 tablet, Rfl: 1  Current Facility-Administered Medications:  .  miconazole (MONISTAT 7) 2 % vaginal cream 1 Applicatorful, 1 Applicatorful, Vaginal, QHS, Larri Yehle V, MD .  triamcinolone acetonide (KENALOG) 10 MG/ML injection 10 mg, 10 mg, Other, Once, [name], DPM   Social History: Reviewed -  reports that she quit smoking about 7 years ago. Her smoking use included Cigarettes. She has a 4.00 pack-year smoking history. She has never used smokeless tobacco.  Objective Findings:  Vitals: Blood pressure (!) 144/90, pulse 83, height 5\' 1"  (1.549 m), weight 136 lb 9.6 oz (62 kg), last menstrual period 03/23/2017.  Physical Examination: General appearance -  alert, well appearing, and in no distress Mental status - alert, oriented to person, place, and time Pelvic -  VULVA: normal appearing vulva with no masses, tenderness or lesions,  VAGINA: normal appearing vagina with normal color and discharge, no lesions,  CERVIX: anterior,  UTERUS: retroverted, tender to touch ADNEXA: normal adnexa in size, nontender and no masses   Assessment & Plan:   A:  1. AUB , s/p endometrial ablation 2 Hx ut fibroids.  P:  1. Megace 3x a day until bleeding stops completely. Begin again if next period is also prolonged.    By signing my name below, I, [name] [name], attest that this documentation has been prepared under the direction and in the presence of Jonnie Kind, MD. Electronically Signed: Jabier Gauss, Medical Scribe. 04/01/17. 2:44 PM.  I personally performed the services described in this documentation, which was SCRIBED in my presence. The recorded information has been reviewed and considered accurate. It has been edited as necessary during review. Jonnie Kind, MD

## 2017-04-01 NOTE — Progress Notes (Deleted)
[facility] Visit  04/01/2017            Patient name: [name] [name] MRN [id number]  Date of birth: September 15, 1970  CC & HPI:  [name] [name] is a 46 y.o. female presenting today for a yeast infection with onset 4 days ago. Pt was taking Flagyl but it has finished. Pt has associated symptoms of increased white, clumpy discharge. No alleviating factors noted. Pt has reoccurring yeast infections. Patient's last menstrual period was 03/23/2017.   ROS:  ROS +white clumpy discharge -fever  Pertinent History Reviewed:   Reviewed: Significant for abnormal pap, BV, HPT, AUB Medical         Past Medical History:  Diagnosis Date   Abnormal vaginal Pap smear    Dr Glo Herring   Allergic rhinitis    BV (bacterial vaginosis) 02/09/2015   Fibroids 10/28/2013   Hyperlipidemia    x4 years    Hypertension    whenshe wasa young but she changed diet and reducd salt intake    Irregular intermenstrual bleeding 10/28/2013   Kidney stones    Mental disorder    Unspecified symptom associated with female genital organs 10/28/2013   Vaginal discharge 02/09/2015   Vaginal odor 02/09/2015   Vaginal Pap smear, abnormal                               Surgical Hx:    Past Surgical History:  Procedure Laterality Date   BREAST REDUCTION SURGERY  Jan 2007   CERVICAL BIOPSY  2010   for abnormal pap    cyst removed from both breast , all benign both breast     DILITATION & CURRETTAGE/HYSTROSCOPY WITH THERMACHOICE ABLATION N/A 12/15/2013   Procedure: DILATATION & CURETTAGE/HYSTEROSCOPY WITH THERMACHOICE ABLATION;  Surgeon: Jonnie Kind, MD;  Location: [facility];  Service: Gynecology;  Laterality: N/A;  total therapy time:62min 1sec D5W  53ml in, D5W  93ml out Temperature 87 degree c.   EXCISION OF SKIN TAG Right 12/15/2013   Procedure: EXCISION OF SKIN TAG;  Surgeon: Jonnie Kind, MD;  Location: [facility];  Service: Gynecology;  Laterality: Right;   LAPAROSCOPIC BILATERAL SALPINGECTOMY  Bilateral 12/15/2013   Procedure: LAPAROSCOPIC BILATERAL SALPINGECTOMY;  Surgeon: Jonnie Kind, MD;  Location: [facility];  Service: Gynecology;  Laterality: Bilateral;   REMOVAL OF NON VAGINAL CONTRACEPTIVE DEVICE Left 12/15/2013   Procedure: REMOVAL OF NON VAGINAL CONTRACEPTIVE DEVICE-[place];  Surgeon: Jonnie Kind, MD;  Location: [facility];  Service: Gynecology;  Laterality: Left;   Medications: Reviewed & Updated - see associated section                       Current Outpatient Prescriptions:    clonazePAM (KLONOPIN) 1 MG tablet, Take 1 tablet (1 mg total) by mouth 2 (two) times daily as needed for anxiety., Disp: 20 tablet, Rfl: 1   lansoprazole (PREVACID) 15 MG capsule, Take 15 mg by mouth daily at 12 noon., Disp: , Rfl:    mometasone (NASONEX) 50 MCG/ACT nasal spray, Place 2 sprays into the nose daily., Disp: 17 g, Rfl: 12   pantoprazole (PROTONIX) 40 MG tablet, Take 1 tablet (40 mg total) by mouth daily., Disp: 90 tablet, Rfl: 0   temazepam (RESTORIL) 15 MG capsule, Take 1 capsule (15 mg total) by mouth at bedtime as needed for sleep. (Patient not taking: Reported on 03/21/2017), Disp: 30 capsule, Rfl:  3  Current Facility-Administered Medications:    triamcinolone acetonide (KENALOG) 10 MG/ML injection 10 mg, 10 mg, Other, Once, [name], DPM   Social History: Reviewed -  reports that she quit smoking about 7 years ago. Her smoking use included Cigarettes. She has a 4.00 pack-year smoking history. She has never used smokeless tobacco.  Objective Findings:  Vitals: Last menstrual period 03/17/2017.  Physical Examination: General appearance - alert, well appearing, and in no distress Mental status - alert, oriented to person, place, and time Pelvic -  VULVA: normal appearing vulva with no masses, tenderness or lesions,  VAGINA: normal appearing vagina with normal color and discharge, no lesions,   Wet prep + for yeast - for trich - for clue cells  Assessment & Plan:    A:  1. Monilial vulvovaginitis  P:  1. Rx diflucan 150 mg, and metrogel    By signing my name below, I, [name], attest that this documentation has been prepared under the direction and in the presence of Jonnie Kind, MD. Electronically Signed: Jabier Gauss, Medical Scribe. 04/01/17. 1:39 PM.  I personally performed the services described in this documentation, which was SCRIBED in my presence. The recorded information has been reviewed and considered accurate. It has been edited as necessary during review. Jonnie Kind, MD

## 2017-04-02 ENCOUNTER — Telehealth: Payer: Self-pay

## 2017-04-02 DIAGNOSIS — E785 Hyperlipidemia, unspecified: Secondary | ICD-10-CM

## 2017-04-02 DIAGNOSIS — E559 Vitamin D deficiency, unspecified: Secondary | ICD-10-CM

## 2017-04-02 DIAGNOSIS — I10 Essential (primary) hypertension: Secondary | ICD-10-CM

## 2017-04-02 DIAGNOSIS — N926 Irregular menstruation, unspecified: Secondary | ICD-10-CM

## 2017-04-02 NOTE — Telephone Encounter (Signed)
Labs added as requested

## 2017-04-05 ENCOUNTER — Other Ambulatory Visit: Payer: Self-pay | Admitting: Obstetrics and Gynecology

## 2017-04-05 DIAGNOSIS — R1032 Left lower quadrant pain: Secondary | ICD-10-CM

## 2017-04-08 ENCOUNTER — Ambulatory Visit (INDEPENDENT_AMBULATORY_CARE_PROVIDER_SITE_OTHER): Payer: Commercial Managed Care - PPO

## 2017-04-08 ENCOUNTER — Encounter: Payer: Self-pay | Admitting: Obstetrics & Gynecology

## 2017-04-08 DIAGNOSIS — R1032 Left lower quadrant pain: Secondary | ICD-10-CM | POA: Diagnosis not present

## 2017-04-08 NOTE — Progress Notes (Signed)
PELVIC US TA/TV:heterogeneous retroverted uterus w/mult.fibroids,largest fibroids (#1) anterior mid submucosal fibroid  1.7 x 1.5 x 1.1 cm,(#2) anterior fundal subserosal 1.5 x 1.9 x 1 cm,EEC 3.6 mm,endometrium is distorted by submucosal fibroid,normal ovaries bilat,ovaries appear mobile,left adnexal pain during ultrasound,small amount of simple cul de sac fluid ,mult simple nabothian cysts

## 2017-04-17 ENCOUNTER — Telehealth: Payer: Self-pay | Admitting: Obstetrics and Gynecology

## 2017-04-17 ENCOUNTER — Ambulatory Visit (INDEPENDENT_AMBULATORY_CARE_PROVIDER_SITE_OTHER): Payer: Commercial Managed Care - PPO | Admitting: Obstetrics and Gynecology

## 2017-04-17 ENCOUNTER — Other Ambulatory Visit: Payer: Self-pay | Admitting: Family Medicine

## 2017-04-17 ENCOUNTER — Encounter: Payer: Self-pay | Admitting: Obstetrics and Gynecology

## 2017-04-17 VITALS — BP 120/82 | HR 89 | Ht 61.0 in | Wt 133.8 lb

## 2017-04-17 DIAGNOSIS — N939 Abnormal uterine and vaginal bleeding, unspecified: Secondary | ICD-10-CM | POA: Diagnosis not present

## 2017-04-17 DIAGNOSIS — D250 Submucous leiomyoma of uterus: Secondary | ICD-10-CM

## 2017-04-17 MED ORDER — NORETHIN ACE-ETH ESTRAD-FE 1-20 MG-MCG(24) PO TABS: 1.0000 | ORAL_TABLET | Freq: Every day | ORAL | 11 refills | Status: DC

## 2017-04-17 NOTE — Telephone Encounter (Signed)
Pt called asking about the difference in the nexplanon and birth control pill that she was prescribed. I informed pt that the nexplanon is placed in the arm for up to 3 years and the hormones are slightly different than whats in the pill. I advised her that she would have to remember to take the pill at the same time everyday. Pt states she may prefer the nexplanon over the birth control pill and requests an appt. Pt transferred to scheduling.

## 2017-04-17 NOTE — Telephone Encounter (Signed)
Patient called stating that she came into the office this morning and she forgot to ask the doctor what is the difference between the nexplanon and the pill he has placed her on. Please contact pt

## 2017-04-17 NOTE — Progress Notes (Addendum)
Patient ID: [name], female   DOB: [birth year], 46 y.o.   MRN: [id number]  [name] [name] is a 46 y.o. [id number] here for follow-up of her abnormal uterine bleeding that has been giving her 12 days of bleeding at a time he desires Mirena IUD insertion. Pt reports that she has been taking megace due to prolonged periods of 12 days. No GYN concerns. Pt had a bilateral salpingectomy completed on  12/15/16. Last pap smear  was normal on 07/10/16 She had an ultrasound last week which showed a retroverted uterus and a submucosal fibroid about 1 cm in diameter. IUD Insertion  Patient identified, informed consent performed. Discussed risks of irregular bleeding, cramping, infection, malpositioning or misplacement of the IUD outside the uterus which may require further procedures. Time out was performed. Speculum placed in the vagina. Cervix visualized. Cleaned with Betadine x 2. Grasped anteriorly with a single tooth tenaculum.  The uterus was sounded in the retroverted position but was tiny and short and I do not think the IUD will be adequately positioned. IUD not placed due to inability to sound uterus due to uterine fibroids which was confirmed by transvaginal US. The ultrasound shows the submucosal fibroid in the fundal portion of the uterus and I think that its blocking potential IUD placement  Discussion: 1. Discussed with pt risks and benefits of low-dose OCP, hysteroscopy, or hysterectomy.   At end of discussion, pt had opportunity to ask questions and has no further questions at this time.   Specific discussion of low-dose OCP, hysteroscopy, or supracervical abdominal hysterectomy , or vaginal hysterectomy as noted above. Greater than 50% was spent in counseling and coordination of care with the patient.   Total time greater than: 25 minutes.    A:  1. AUB secondary to submucosal fibroid 2. S/p endometrial ablation and salpingectomy.   P; 1. Loestrin Rx 2. Patient to follow up by  phone or Mychart as to whether she prefers Megace or OCPs to control her AUB 3. Follow-up in 6 months to decide regarding hysterectomy type and time By signing my name below, I, Soijett Blue, attest that this documentation has been prepared under the direction and in the presence of Jonnie Kind, MD. Electronically Signed: Soijett Blue, ED Scribe. 04/17/17. 9:14 AM.  I personally performed the services described in this documentation, which was SCRIBED in my presence. The recorded information has been reviewed and considered accurate. It has been edited as necessary during review. Jonnie Kind, MD

## 2017-07-17 ENCOUNTER — Ambulatory Visit (INDEPENDENT_AMBULATORY_CARE_PROVIDER_SITE_OTHER): Payer: Commercial Managed Care - PPO | Admitting: Family Medicine

## 2017-07-17 ENCOUNTER — Encounter: Payer: Self-pay | Admitting: Family Medicine

## 2017-07-17 VITALS — BP 120/84 | HR 84 | Resp 16 | Ht 61.0 in | Wt 135.0 lb

## 2017-07-17 DIAGNOSIS — Z1211 Encounter for screening for malignant neoplasm of colon: Secondary | ICD-10-CM

## 2017-07-17 DIAGNOSIS — J309 Allergic rhinitis, unspecified: Secondary | ICD-10-CM | POA: Diagnosis not present

## 2017-07-17 DIAGNOSIS — E785 Hyperlipidemia, unspecified: Secondary | ICD-10-CM

## 2017-07-17 DIAGNOSIS — E559 Vitamin D deficiency, unspecified: Secondary | ICD-10-CM

## 2017-07-17 DIAGNOSIS — Z0000 Encounter for general adult medical examination without abnormal findings: Secondary | ICD-10-CM | POA: Diagnosis not present

## 2017-07-17 LAB — POC HEMOCCULT BLD/STL (OFFICE/1-CARD/DIAGNOSTIC): Fecal Occult Blood, POC: NEGATIVE

## 2017-07-17 MED ORDER — ERGOCALCIFEROL 1.25 MG (50000 UT) PO CAPS: 50000.0000 [IU] | ORAL_CAPSULE | ORAL | 1 refills | Status: DC

## 2017-07-17 MED ORDER — METHYLPREDNISOLONE ACETATE 80 MG/ML IJ SUSP
80.0000 mg | Freq: Once | INTRAMUSCULAR | Status: AC
  Administered 2017-07-17: 80 mg via INTRAMUSCULAR

## 2017-07-17 MED ORDER — MOMETASONE FUROATE 50 MCG/ACT NA SUSP: 2.0000 | Freq: Every day | NASAL | 12 refills | Status: DC

## 2017-07-17 NOTE — Assessment & Plan Note (Addendum)
Updated lab needed at/ before next visit. follow low fat diet  Repeat lipid profile in 5.5 months

## 2017-07-17 NOTE — Assessment & Plan Note (Signed)
Needs weekly supplement x 6 months and rept lab

## 2017-07-17 NOTE — Assessment & Plan Note (Signed)

## 2017-07-17 NOTE — Progress Notes (Signed)
    [name] [name]     MRN: [id number]      DOB: 1970/09/01  HPI: Patient is in for annual physical exam. C/o maxillary pressure and being off balance, with sinus pressure x 4 days, wants allergy shot Recent labs, are reviewed.drawen in feb 2018, and show elevated lipids Immunization is reviewed , and  Is up to date.   PE: BP 120/84   Pulse 84   Resp 16   Ht 5\' 1"  (1.549 m)   Wt 135 lb (61.2 kg)   LMP 07/10/2017   SpO2 97%   BMI 25.51 kg/m   Pleasant  female, alert and oriented x 3, in no cardio-pulmonary distress. Afebrile. HEENT No facial trauma or asymetry. Ethmoid Sinuses  tender.  Extra occullar muscles intact, . External ears normal, tympanic membranes clear. Oropharynx moist, no exudate. Neck: supple, no adenopathy,JVD or thyromegaly.No bruits.  Chest: Clear to ascultation bilaterally.No crackles or wheezes. Non tender to palpation  Breast: No asymetry,no masses or lumps. No tenderness. No nipple discharge or inversion. No axillary or supraclavicular adenopathy  Cardiovascular system; Heart sounds normal,  S1 and  S2 ,no S3.  No murmur, or thrill. Apical beat not displaced Peripheral pulses normal.  Abdomen: Soft, non tender, no organomegaly or masses. No bruits. Bowel sounds normal. No guarding, tenderness or rebound.  Rectal:  Normal sphincter tone. No rectal mass. Guaiac negative stool.  GU: Not examined  Musculoskeletal exam: Full ROM of spine, hips , shoulders and knees. No deformity ,swelling or crepitus noted. No muscle wasting or atrophy.   Neurologic: Cranial nerves 2 to 12 intact. Power, tone ,sensation and reflexes normal throughout. No disturbance in gait. No tremor.  Skin: Intact, no ulceration, erythema , scaling or rash noted. Pigmentation normal throughout  Psych; Normal mood and affect. Judgement and concentration normal   Vitamin D deficiency Needs weekly supplement x 6 months and rept lab  Hyperlipidemia LDL  goal <100 Updated lab needed at/ before next visit. follow low fat diet  Repeat lipid profile in 5.5 months  Annual physical exam Annual exam as documented. Counseling done  re healthy lifestyle involving commitment to 150 minutes exercise per week, heart healthy diet, and attaining healthy weight.The importance of adequate sleep also discussed. Regular seat belt use and home safety, is also discussed. Changes in health habits are decided on by the patient with goals and time frames  set for achieving them. Immunization and cancer screening needs are specifically addressed at this visit.   Allergic rhinitis Uncontrolled with facial pressure and lightheadedness x 4 days, has good response to steroids, depo medrol 80 mg IM in office and also start nasonex

## 2017-07-17 NOTE — Patient Instructions (Signed)
F/u in 6 months, call if you need me sooner   Please work on changing food choice , cut out chips and cookies so that your cholesterol and blood pressure normalize , your weight will also get better You can do this! Fasting lipid panel in 6 months  Your cholesterol and blood pressure are not totally normal and your blood sugar is upper limit of normal, so you have your work cut out!!!  It is important that you exercise regularly at least 30 minutes 5 times a week. If you develop chest pain, have severe difficulty breathing, or feel very tired, stop exercising immediately and seek medical attention     Once weekly vitamin D, and nasonex are prescribed  You have been given depo medrol 80 mg IM today for your allergy symptoms  All the best!  Thank you  for choosing [facility]. We consider it a privelige to serve you.  Delivering excellent health care in a caring and  compassionate way is our goal.  Partnering with you,  so that together we can achieve this goal is our strategy.

## 2017-07-17 NOTE — Assessment & Plan Note (Signed)
Uncontrolled with facial pressure and lightheadedness x 4 days, has good response to steroids, depo medrol 80 mg IM in office and also start nasonex

## 2017-07-23 ENCOUNTER — Other Ambulatory Visit: Payer: Self-pay | Admitting: Family Medicine

## 2017-07-25 NOTE — Telephone Encounter (Signed)
Seen 11 21 18 

## 2017-08-28 ENCOUNTER — Telehealth: Payer: Self-pay

## 2017-08-28 NOTE — Telephone Encounter (Signed)
Pt called and said her labs were not covered because the wrong code was charged due to the order that was places.  She said we need to send in a correction on the order to show this is routine labs for annual physical. She had the labs drawn at [facility]. Her call back number is [phone].

## 2017-08-30 NOTE — Telephone Encounter (Signed)
Change of daig code to [email or web address] was sent to [facility] lab requesting the change and resubmission. Left message on pt voicemail

## 2017-09-04 ENCOUNTER — Other Ambulatory Visit: Payer: Self-pay | Admitting: Family Medicine

## 2017-09-05 ENCOUNTER — Other Ambulatory Visit: Payer: Self-pay | Admitting: Family Medicine

## 2017-10-16 ENCOUNTER — Telehealth: Payer: Self-pay | Admitting: Family Medicine

## 2017-10-16 NOTE — Telephone Encounter (Signed)
Patient left message on nurse line stating that she is taking 1 mg clonazepam twice daily and is running out too fast. She is asking for medication to be increased so that she is only taking one a day.   Callback# [phone]

## 2017-10-16 NOTE — Telephone Encounter (Signed)
Called patient and informed that Dr. Moshe Cipro was out of the office and that the other providers here do not prescribe controlled substances, and that this will have to be addressed when Dr. Moshe Cipro returns.

## 2017-10-18 ENCOUNTER — Ambulatory Visit: Payer: Commercial Managed Care - PPO | Admitting: Obstetrics and Gynecology

## 2017-10-23 NOTE — Telephone Encounter (Signed)
I tried to call the pt , no answer, she needs an office visit to address medication and treatment for her anxiety, please schedule and explain this to her

## 2017-10-24 NOTE — Telephone Encounter (Signed)
Needs appt to discuss med change

## 2017-10-28 NOTE — Telephone Encounter (Signed)
I called the PT and offered her an appt to be seen, and she said that it would have to be May. I advised her that we had her on the sch for May, and that Dr Moshe Cipro, would like to see her prior to that. She will call back in the AM

## 2017-10-29 ENCOUNTER — Encounter: Payer: Self-pay | Admitting: Family Medicine

## 2017-10-29 ENCOUNTER — Ambulatory Visit: Payer: Commercial Managed Care - PPO | Admitting: Family Medicine

## 2017-10-29 VITALS — BP 122/72 | HR 68 | Temp 98.2°F | Ht 61.0 in | Wt 130.0 lb

## 2017-10-29 DIAGNOSIS — J0101 Acute recurrent maxillary sinusitis: Secondary | ICD-10-CM

## 2017-10-29 DIAGNOSIS — J309 Allergic rhinitis, unspecified: Secondary | ICD-10-CM

## 2017-10-29 MED ORDER — AMOXICILLIN 875 MG PO TABS: 875.0000 mg | ORAL_TABLET | Freq: Two times a day (BID) | ORAL | 0 refills | Status: DC

## 2017-10-29 MED ORDER — PREDNISONE 20 MG PO TABS: 20.0000 mg | ORAL_TABLET | Freq: Two times a day (BID) | ORAL | 0 refills | Status: DC

## 2017-10-29 MED ORDER — FLUCONAZOLE 150 MG PO TABS
150.0000 mg | ORAL_TABLET | Freq: Once | ORAL | 0 refills | Status: DC
Start: 2017-10-29 — End: 2018-01-13

## 2017-10-29 MED ORDER — MECLIZINE HCL 12.5 MG PO TABS
12.5000 mg | ORAL_TABLET | Freq: Three times a day (TID) | ORAL | 0 refills | Status: DC | PRN
Start: 2017-10-29 — End: 2018-01-07

## 2017-10-29 NOTE — Progress Notes (Signed)
Chief Complaint  Patient presents with  . Sinusitis    right ear aching, pressure in facial area, dizziness    Usual primary care provider is Tula Nakayama. She is seen today for a sick visit for a sinus infection. She tells me that she has chronic allergies, and frequent sinus infections.  She is usually seen at the urgent care center.  She currently has been having symptoms for about a week.  She has pain in the sinuses of her right cheek and behind her eye, pain in her right ear, and popping and pressure in the ear.  Mild sore throat.  Headache.  Not sleeping well.  Mild cough.  She has purulent PND that is thick and difficult to clear. She has taken some Mucinex.  She has taken some over-the-counter medicines.  In spite of this her symptoms feel like they are worsening.  She is using a steroid nasal spray.  She is using a Nettie pot for sinus rinses.  Patient Active Problem List   Diagnosis Date Noted  . Abnormal uterine bleeding (AUB) 04/01/2017  . Generalized anxiety disorder 11/23/2013  . Fibroids, submucosal 10/28/2013  . Insomnia 07/31/2013  . Annual physical exam 09/22/2012  . Vitamin D deficiency 12/24/2010  . Hyperlipidemia LDL goal <100 11/30/2009  . Allergic rhinitis 07/27/2009  . GERD 07/27/2009    Outpatient Encounter Medications as of 10/29/2017  Medication Sig  . clonazePAM (KLONOPIN) 1 MG tablet TK 1 T PO BID FOR ANXIETY  . ergocalciferol (VITAMIN D2) 50000 units capsule Take 1 capsule (50,000 Units total) by mouth once a week. One capsule once weekly  . mometasone (NASONEX) 50 MCG/ACT nasal spray Place 2 sprays into the nose daily.  . Norethindrone Acetate-Ethinyl Estrad-FE (LOESTRIN 24 FE) 1-20 MG-MCG(24) tablet Take 1 tablet by mouth daily.  . pantoprazole (PROTONIX) 40 MG tablet TAKE 1 TABLET ONCE DAILY.  Marland Kitchen amoxicillin (AMOXIL) 875 MG tablet Take 1 tablet (875 mg total) by mouth 2 (two) times daily.  . Cholecalciferol (VITAMIN D-3 PO) Take 50 mg by mouth.  Takes 4 daily  . fluconazole (DIFLUCAN) 150 MG tablet Take 1 tablet (150 mg total) by mouth once for 1 dose.  . meclizine (ANTIVERT) 12.5 MG tablet Take 1 tablet (12.5 mg total) by mouth 3 (three) times daily as needed for dizziness.  . predniSONE (DELTASONE) 20 MG tablet Take 1 tablet (20 mg total) by mouth 2 (two) times daily with a meal.  . [DISCONTINUED] clonazePAM (KLONOPIN) 1 MG tablet TAKE 1 TABLET BY MOUTH TWICE DAILY AS NEEDED FOR ANXIETY   Facility-Administered Encounter Medications as of 10/29/2017  Medication  . miconazole (MONISTAT 7) 2 % vaginal cream 1 Applicatorful  . triamcinolone acetonide (KENALOG) 10 MG/ML injection 10 mg    No Known Allergies  Review of Systems  Constitutional: Negative for activity change, appetite change, chills and fever.  HENT: Positive for congestion, ear pain, postnasal drip, rhinorrhea, sinus pressure, sinus pain and trouble swallowing.   Eyes: Negative for redness and visual disturbance.  Respiratory: Negative for cough and shortness of breath.   Cardiovascular: Negative for chest pain and palpitations.  Gastrointestinal: Negative for diarrhea, nausea and vomiting.  Musculoskeletal: Negative for arthralgias and myalgias.  Neurological: Positive for dizziness and headaches.  Psychiatric/Behavioral: Positive for sleep disturbance.    BP 122/72 (BP Location: Left Arm, Patient Position: Sitting, Cuff Size: Normal)   Pulse 68   Temp 98.2 F (36.8 C) (Oral)   Ht 5\' 1"  (1.549 m)  Wt 130 lb (59 kg)   LMP 10/01/2017 (LMP Unknown)   SpO2 97%   BMI 24.56 kg/m   Physical Exam  Constitutional: She is oriented to person, place, and time. She appears well-developed and well-nourished. She appears distressed.  Appears tired, moderately ill  HENT:  Head: Normocephalic and atraumatic.  Right Ear: External ear normal.  Left Ear: External ear normal.  Mouth/Throat: Oropharynx is clear and moist.  Posterior pharynx is injected.  Mild tonsillar  hypertrophy.  No exudate.  Nasal membranes are swollen and red.  Facial sinuses are tender.  Eyes: Conjunctivae are normal. Pupils are equal, round, and reactive to light.  Neck: Normal range of motion. No thyromegaly present.  Cardiovascular: Normal rate, regular rhythm and normal heart sounds.  Pulmonary/Chest: Effort normal and breath sounds normal. She has no rales.  Lymphadenopathy:    She has no cervical adenopathy.  Neurological: She is alert and oriented to person, place, and time.  Psychiatric: She has a normal mood and affect. Her behavior is normal.    ASSESSMENT/PLAN:  1. Allergic rhinitis, unspecified seasonality, unspecified trigger Chronic  2. Acute recurrent maxillary sinusitis Discussed taking the prednisone first.  If the symptoms do not improve then she should add amoxicillin.  If she needs the amoxicillin she tells me she always gets yeast infections with Diflucan is provided.  She should continue her nasal spray and sinus rinsing.  Call if not better in a few days.   Patient Instructions   Home today.  Work note is given.  Continue to drink plenty of fluids.  Take prednisone twice a day.  Take 2 doses today. Continue using your nasal spray. May continue with your sinus rinsing.  Take amoxicillin twice a day if needed for infection. Take Diflucan if needed after amoxicillin  Call if not better in a couple days   Raylene Everts, MD

## 2017-10-29 NOTE — Patient Instructions (Signed)
  Home today.  Work note is given.  Continue to drink plenty of fluids.  Take prednisone twice a day.  Take 2 doses today. Continue using your nasal spray. May continue with your sinus rinsing.  Take amoxicillin twice a day if needed for infection. Take Diflucan if needed after amoxicillin  Call if not better in a couple days

## 2017-11-15 ENCOUNTER — Telehealth: Payer: Self-pay

## 2017-11-15 NOTE — Telephone Encounter (Signed)
States she has an appt in June and she is requesting enough klonopin to last until her next appt. Will only need about 10 to last. Please advise

## 2017-11-18 ENCOUNTER — Other Ambulatory Visit: Payer: Self-pay | Admitting: Family Medicine

## 2017-11-18 ENCOUNTER — Telehealth: Payer: Self-pay | Admitting: Family Medicine

## 2017-11-18 MED ORDER — CLONAZEPAM 1 MG PO TABS: ORAL_TABLET | ORAL | 0 refills | Status: DC

## 2017-11-18 NOTE — Telephone Encounter (Signed)
Pt needs rx refill for clonazePAM (KLONOPIN) 1 MG tablet [[id number]]  To last until 01/27/18 when her next appt is.  She can not get off work to come prior to 01/27/18.  She will need enough to last till then.

## 2017-11-18 NOTE — Telephone Encounter (Signed)
Pt aware that her Klonopin ius refilled

## 2017-11-18 NOTE — Telephone Encounter (Signed)
Pt aware.

## 2017-11-19 ENCOUNTER — Other Ambulatory Visit: Payer: Self-pay | Admitting: Family Medicine

## 2017-11-19 ENCOUNTER — Other Ambulatory Visit: Payer: Self-pay

## 2017-11-19 MED ORDER — CLONAZEPAM 1 MG PO TABS: ORAL_TABLET | ORAL | 0 refills | Status: DC

## 2017-12-22 ENCOUNTER — Other Ambulatory Visit: Payer: Self-pay | Admitting: Obstetrics and Gynecology

## 2018-01-02 ENCOUNTER — Encounter: Payer: Self-pay | Admitting: Family Medicine

## 2018-01-07 ENCOUNTER — Encounter: Payer: Self-pay | Admitting: Adult Health

## 2018-01-07 ENCOUNTER — Other Ambulatory Visit: Payer: Self-pay

## 2018-01-07 ENCOUNTER — Ambulatory Visit: Payer: Commercial Managed Care - PPO | Admitting: Adult Health

## 2018-01-07 VITALS — BP 122/80 | HR 78 | Ht 61.0 in | Wt 132.0 lb

## 2018-01-07 DIAGNOSIS — D250 Submucous leiomyoma of uterus: Secondary | ICD-10-CM

## 2018-01-07 DIAGNOSIS — N926 Irregular menstruation, unspecified: Secondary | ICD-10-CM | POA: Diagnosis not present

## 2018-01-07 DIAGNOSIS — M549 Dorsalgia, unspecified: Secondary | ICD-10-CM | POA: Diagnosis not present

## 2018-01-07 DIAGNOSIS — D219 Benign neoplasm of connective and other soft tissue, unspecified: Secondary | ICD-10-CM

## 2018-01-07 MED ORDER — NORETHIN ACE-ETH ESTRAD-FE 1-20 MG-MCG(24) PO TABS: 1.0000 | ORAL_TABLET | Freq: Every day | ORAL | 6 refills | Status: DC

## 2018-01-07 NOTE — Progress Notes (Signed)
  Subjective:     Patient ID: [name] [name], female   DOB: 1971/08/26, 47 y.o.   MRN: [id number]  HPI [name] [name] is a 47 year old black female in to discuss her bleeding, she has fibroids per [place] in 2018.She is currently on OCs and will spot or bleed heavy, she wants hysterectomy in October, if possible.   Review of Systems +irregular bleeding +back ache at times Reviewed past medical,surgical, social and family history. Reviewed medications and allergies.     Objective:   Physical Exam BP 122/80 (BP Location: Right Arm, Patient Position: Sitting, Cuff Size: Normal)   Pulse 78   Ht 5\' 1"  (1.549 m)   Wt 132 lb (59.9 kg)   LMP 12/19/2017   BMI 24.94 kg/m  Skin warm and dry. Lungs: clear to ausculation bilaterally. Cardiovascular: regular rate and rhythm.   Discussed taking OCs continuously and then proceed to laparoscopic  Hysterectomy([facility] if possible), will schedule appt with Dr Glo Herring in September, she had normal pap in 2017 by Dr Moshe Cipro. Face time 15 minutes with 50% counseling and explaining.  Assessment:     1. Irregular bleeding   2. Fibroids       Plan:     Meds ordered this encounter  Medications  . Norethindrone Acetate-Ethinyl Estrad-FE (JUNEL FE 24) 1-20 MG-MCG(24) tablet    Sig: Take 1 tablet by mouth daily. Take continuously, do not take 4 inert tabs    Dispense:  28 tablet    Refill:  6    Order Specific Question:   Supervising Provider    Answer:   Tania Ade H [[id number]]  F/U in 4 months to discuss surgery with Dr Glo Herring Review handouts on laparoscopic hysterectomy.

## 2018-01-07 NOTE — Patient Instructions (Signed)
Total Laparoscopic Hysterectomy °A total laparoscopic hysterectomy is a minimally invasive surgery to remove your uterus and cervix. This surgery is performed by making several small cuts (incisions) in your abdomen. It can also be done with a thin, lighted tube (laparoscope) inserted into two small incisions in your lower abdomen. Your fallopian tubes and ovaries can be removed (bilateral salpingo-oophorectomy) during this surgery as well. Benefits of minimally invasive surgery include: °· Less pain. °· Less risk of blood loss. °· Less risk of infection. °· Quicker return to normal activities. ° °Tell a health care provider about: °· Any allergies you have. °· All medicines you are taking, including vitamins, herbs, eye drops, creams, and over-the-counter medicines. °· Any problems you or family members have had with anesthetic medicines. °· Any blood disorders you have. °· Any surgeries you have had. °· Any medical conditions you have. °What are the risks? °Generally, this is a safe procedure. However, as with any procedure, complications can occur. Possible complications include: °· Bleeding. °· Blood clots in the legs or lung. °· Infection. °· Injury to surrounding organs. °· Problems with anesthesia. °· Early menopause symptoms (hot flashes, night sweats, insomnia). °· Risk of conversion to an open abdominal incision. ° °What happens before the procedure? °· Ask your health care provider about changing or stopping your regular medicines. °· Do not take aspirin or blood thinners (anticoagulants) for 1 week before the surgery or as told by your health care provider. °· Do not eat or drink anything for 8 hours before the surgery or as told by your health care provider. °· Quit smoking if you smoke. °· Arrange for a ride home after surgery and for someone to help you at home during recovery. °What happens during the procedure? °· You will be given antibiotic medicine. °· An IV tube will be placed in your arm. You  will be given medicine to make you sleep (general anesthetic). °· A gas (carbon dioxide) will be used to inflate your abdomen. This will allow your surgeon to look inside your abdomen, perform your surgery, and treat any other problems found if necessary. °· Three or four small incisions (often less than 1/2 inch) will be made in your abdomen. One of these incisions will be made in the area of your belly button (navel). The laparoscope will be inserted into the incision. Your surgeon will look through the laparoscope while doing your procedure. °· Other surgical instruments will be inserted through the other incisions. °· Your uterus may be removed through your vagina or cut into small pieces and removed through the small incisions. °· Your incisions will be closed. °What happens after the procedure? °· The gas will be released from inside your abdomen. °· You will be taken to the recovery area where a nurse will watch and check your progress. Once you are awake, stable, and taking fluids well, without other problems, you will return to your room or be allowed to go home. °· There is usually minimal discomfort following the surgery because the incisions are so small. °· You will be given pain medicine while you are in the hospital and for when you go home. °This information is not intended to replace advice given to you by your health care provider. Make sure you discuss any questions you have with your health care provider. °Document Released: 06/10/2007 Document Revised: 01/19/2016 Document Reviewed: 03/03/2013 °Elsevier Interactive Patient Education © 2017 [employer] ° °

## 2018-01-11 ENCOUNTER — Other Ambulatory Visit: Payer: Self-pay | Admitting: Obstetrics and Gynecology

## 2018-01-12 ENCOUNTER — Other Ambulatory Visit: Payer: Self-pay

## 2018-01-12 ENCOUNTER — Ambulatory Visit (HOSPITAL_COMMUNITY)
Admission: EM | Admit: 2018-01-12 | Discharge: 2018-01-12 | Disposition: A | Discharge status: Home / Self Care | Payer: Commercial Managed Care - PPO | Attending: Physician Assistant | Admitting: Physician Assistant

## 2018-01-12 ENCOUNTER — Encounter (HOSPITAL_COMMUNITY): Payer: Self-pay

## 2018-01-12 DIAGNOSIS — Z87891 Personal history of nicotine dependence: Secondary | ICD-10-CM | POA: Insufficient documentation

## 2018-01-12 DIAGNOSIS — Z811 Family history of alcohol abuse and dependence: Secondary | ICD-10-CM | POA: Diagnosis not present

## 2018-01-12 DIAGNOSIS — Z8249 Family history of ischemic heart disease and other diseases of the circulatory system: Secondary | ICD-10-CM | POA: Diagnosis not present

## 2018-01-12 DIAGNOSIS — Z8261 Family history of arthritis: Secondary | ICD-10-CM | POA: Insufficient documentation

## 2018-01-12 DIAGNOSIS — Z8349 Family history of other endocrine, nutritional and metabolic diseases: Secondary | ICD-10-CM | POA: Insufficient documentation

## 2018-01-12 DIAGNOSIS — Z87442 Personal history of urinary calculi: Secondary | ICD-10-CM | POA: Insufficient documentation

## 2018-01-12 DIAGNOSIS — I10 Essential (primary) hypertension: Secondary | ICD-10-CM | POA: Insufficient documentation

## 2018-01-12 DIAGNOSIS — N898 Other specified noninflammatory disorders of vagina: Secondary | ICD-10-CM | POA: Diagnosis not present

## 2018-01-12 DIAGNOSIS — Z113 Encounter for screening for infections with a predominantly sexual mode of transmission: Secondary | ICD-10-CM | POA: Insufficient documentation

## 2018-01-12 DIAGNOSIS — Z825 Family history of asthma and other chronic lower respiratory diseases: Secondary | ICD-10-CM | POA: Insufficient documentation

## 2018-01-12 DIAGNOSIS — Z818 Family history of other mental and behavioral disorders: Secondary | ICD-10-CM | POA: Diagnosis not present

## 2018-01-12 DIAGNOSIS — Z8269 Family history of other diseases of the musculoskeletal system and connective tissue: Secondary | ICD-10-CM | POA: Diagnosis not present

## 2018-01-12 DIAGNOSIS — E785 Hyperlipidemia, unspecified: Secondary | ICD-10-CM | POA: Diagnosis not present

## 2018-01-12 NOTE — ED Triage Notes (Signed)
Patient presents tio [facility] for possible STD, pt thinks she may have been exposed to STD by partner. Pt states  has been having a thick white discharge and pelvic pain

## 2018-01-12 NOTE — Discharge Instructions (Addendum)
I will release your results to mychart.  Please return if you need anything at all.

## 2018-01-12 NOTE — ED Provider Notes (Signed)
01/12/2018 2:01 PM   DOB: Dec 14, 1970 / MRN: [id number]  SUBJECTIVE:  [name] [name] is a 47 y.o. female presenting for concern for STI.  She just ended a relationship with her boyfriend who was cheating on her.  She reports a mild white discharge today and has been taking diflucan for this. Last pap two years ago and normal with negative HPV. Denies frank exposure to STI.   She has No Known Allergies.   She  has a past medical history of Abnormal vaginal Pap smear, Allergic rhinitis, BV (bacterial vaginosis) (02/09/2015), Fibroids (10/28/2013), Hyperlipidemia, Hypertension, Irregular intermenstrual bleeding (10/28/2013), Kidney stones, Mental disorder, Unspecified symptom associated with female genital organs (10/28/2013), Vaginal discharge (02/09/2015), Vaginal odor (02/09/2015), and Vaginal Pap smear, abnormal.    She  reports that she quit smoking about 8 years ago. Her smoking use included cigarettes. She has a 4.00 pack-year smoking history. She has never used smokeless tobacco. She reports that she drinks alcohol. She reports that she does not use drugs. She  reports that she currently engages in sexual activity. She reports using the following method of birth control/protection: Surgical. The patient  has a past surgical history that includes Cervical biopsy (2010); cyst removed from both breast , all benign both breast; Breast reduction surgery (Jan 2007); Excision of skin tag (Right, 12/15/2013); Laparoscopic bilateral salpingectomy (Bilateral, 12/15/2013); Dilatation & currettage/hysteroscopy with thermachoice ablation (N/A, 12/15/2013); and Removal of non vaginal contraceptive device (Left, 12/15/2013).  Her family history includes Alcohol abuse in her brother, brother, brother, brother, and father; Anxiety disorder in her sister; Arthritis in her mother; Bipolar disorder in her brother, father, and sister; COPD in her father; Diabetes in her maternal grandfather; Fibromyalgia in her mother; Hypertension  in her mother; Other in her brother and paternal grandfather; Thyroid disease in her mother.  Review of Systems  Constitutional: Negative for chills, diaphoresis and fever.  Respiratory: Negative for cough, hemoptysis, sputum production, shortness of breath and wheezing.   Cardiovascular: Negative for chest pain, orthopnea and leg swelling.  Gastrointestinal: Negative for nausea.  Genitourinary: Negative for dysuria, flank pain, frequency, hematuria and urgency.  Musculoskeletal: Negative for myalgias.  Skin: Negative for rash.  Neurological: Negative for dizziness.    OBJECTIVE:  BP (!) 142/98 (BP Location: Left Arm)   Pulse 71   Temp 98.4 F (36.9 C) (Oral)   Resp 16   LMP 12/19/2017 (Exact Date)   SpO2 100%   Wt Readings from Last 3 Encounters:  01/07/18 132 lb (59.9 kg)  10/29/17 130 lb (59 kg)  07/17/17 135 lb (61.2 kg)   Temp Readings from Last 3 Encounters:  01/12/18 98.4 F (36.9 C) (Oral)  10/29/17 98.2 F (36.8 C) (Oral)  08/01/16 99.4 F (37.4 C)   BP Readings from Last 3 Encounters:  01/12/18 (!) 142/98  01/07/18 122/80  10/29/17 122/72   Pulse Readings from Last 3 Encounters:  01/12/18 71  01/07/18 78  10/29/17 68    Physical Exam  Constitutional: She is oriented to person, place, and time. She appears well-nourished. No distress.  Eyes: Pupils are equal, round, and reactive to light. EOM are normal.  Cardiovascular: Normal rate.  Pulmonary/Chest: Effort normal.  Abdominal: She exhibits no distension. Hernia confirmed negative in the right inguinal area and confirmed negative in the left inguinal area.  Genitourinary: Vagina normal and uterus normal. Pelvic exam was performed with patient prone. There is no rash, tenderness or lesion on the right labia. There is no rash, tenderness or  lesion on the left labia. Uterus is not deviated, not enlarged, not fixed and not tender. Cervix exhibits no motion tenderness, no discharge and no friability. Right  adnexum displays no mass, no tenderness and no fullness. Left adnexum displays no mass, no tenderness and no fullness. No erythema, tenderness or bleeding in the vagina. No foreign body in the vagina. No signs of injury around the vagina. No vaginal discharge found.  Lymphadenopathy: No inguinal adenopathy noted on the right or left side.  Neurological: She is alert and oriented to person, place, and time. No cranial nerve deficit. Gait normal.  Skin: Skin is dry. She is not diaphoretic.  Psychiatric: She has a normal mood and affect.  Vitals reviewed.   No results found for this or any previous visit (from the past 72 hour(s)).  No results found.  ASSESSMENT AND PLAN:   Vaginal discharge: Will screen for STI.     Discharge Instructions     I will release your results to mychart.  Please return if you need anything at all.         The patient is advised to call or return to clinic if she does not see an improvement in symptoms, or to seek the care of the closest emergency department if she worsens with the above plan.   Philis Fendt, MHS, PA-C 01/12/2018 2:01 PM   Tereasa Coop, PA-C 01/12/18 1402

## 2018-01-13 ENCOUNTER — Telehealth: Payer: Self-pay | Admitting: *Deleted

## 2018-01-13 ENCOUNTER — Other Ambulatory Visit: Payer: Self-pay | Admitting: Family Medicine

## 2018-01-13 ENCOUNTER — Telehealth (HOSPITAL_COMMUNITY): Payer: Self-pay

## 2018-01-13 ENCOUNTER — Other Ambulatory Visit: Payer: Self-pay | Admitting: Adult Health

## 2018-01-13 LAB — CERVICOVAGINAL ANCILLARY ONLY
Bacterial vaginitis: NEGATIVE
Chlamydia: NEGATIVE
Neisseria Gonorrhea: NEGATIVE
Trichomonas: NEGATIVE

## 2018-01-13 LAB — RPR: RPR Ser Ql: NONREACTIVE

## 2018-01-13 LAB — HIV ANTIBODY (ROUTINE TESTING W REFLEX): HIV Screen 4th Generation wRfx: NONREACTIVE

## 2018-01-13 NOTE — Telephone Encounter (Signed)
Negative RPR, pt contacted and made aware. Answered all questions.

## 2018-01-13 NOTE — Telephone Encounter (Signed)
Patient is also fluconazole. Please advise.

## 2018-01-14 ENCOUNTER — Ambulatory Visit: Payer: Self-pay | Admitting: Family Medicine

## 2018-01-17 ENCOUNTER — Ambulatory Visit: Payer: Commercial Managed Care - PPO | Admitting: Adult Health

## 2018-01-27 ENCOUNTER — Ambulatory Visit: Payer: Commercial Managed Care - PPO | Admitting: Family Medicine

## 2018-01-27 ENCOUNTER — Encounter: Payer: Self-pay | Admitting: Family Medicine

## 2018-01-27 ENCOUNTER — Telehealth (INDEPENDENT_AMBULATORY_CARE_PROVIDER_SITE_OTHER): Payer: Commercial Managed Care - PPO

## 2018-01-27 VITALS — BP 118/80 | HR 72 | Resp 16 | Ht 61.0 in | Wt 132.4 lb

## 2018-01-27 DIAGNOSIS — E785 Hyperlipidemia, unspecified: Secondary | ICD-10-CM

## 2018-01-27 DIAGNOSIS — F339 Major depressive disorder, recurrent, unspecified: Secondary | ICD-10-CM

## 2018-01-27 DIAGNOSIS — F419 Anxiety disorder, unspecified: Secondary | ICD-10-CM

## 2018-01-27 DIAGNOSIS — F331 Major depressive disorder, recurrent, moderate: Secondary | ICD-10-CM

## 2018-01-27 DIAGNOSIS — F411 Generalized anxiety disorder: Secondary | ICD-10-CM

## 2018-01-27 DIAGNOSIS — K219 Gastro-esophageal reflux disease without esophagitis: Secondary | ICD-10-CM

## 2018-01-27 DIAGNOSIS — F5104 Psychophysiologic insomnia: Secondary | ICD-10-CM

## 2018-01-27 MED ORDER — CLONAZEPAM 1 MG PO TABS: ORAL_TABLET | ORAL | 0 refills | Status: DC

## 2018-01-27 MED ORDER — ROSUVASTATIN CALCIUM 10 MG PO TABS: 10.0000 mg | ORAL_TABLET | Freq: Every day | ORAL | 5 refills | Status: DC

## 2018-01-27 MED ORDER — LANSOPRAZOLE 30 MG PO CPDR: 30.0000 mg | DELAYED_RELEASE_CAPSULE | Freq: Two times a day (BID) | ORAL | 5 refills | Status: DC

## 2018-01-27 MED ORDER — FLUOXETINE HCL 20 MG PO TABS: 20.0000 mg | ORAL_TABLET | Freq: Every day | ORAL | 3 refills | Status: DC

## 2018-01-27 MED ORDER — BUSPIRONE HCL 5 MG PO TABS: 5.0000 mg | ORAL_TABLET | Freq: Two times a day (BID) | ORAL | 5 refills | Status: DC

## 2018-01-27 NOTE — Assessment & Plan Note (Addendum)
Symptomatic on protonix 40 mg twice daily, will change to prevacid, re educated pt about the  Need to avoid caffeine and not eat more than 3 hrs before sleep

## 2018-01-27 NOTE — Patient Instructions (Addendum)
F/U early Occtober, call if you need me before  New for depression and anxiety are fluoxetine and buspar, you have taken both before  You are also referred to tele therapy they will call you  New for cholesterol is generic crestor , one at bedtime  New med for reflux is sent in  Fasting lipid, , CBC, chem 7 and hepatic panel, TSh and vit D 1 week before next visit , pls bring result  Thank you  for choosing [facility]. We consider it a privelige to serve you.  Delivering excellent health care in a caring and  compassionate way is our goal.  Partnering with you,  so that together we can achieve this goal is our strategy.

## 2018-01-27 NOTE — BH Specialist Note (Signed)
[facility] Initial Clinical Assessment  MRN: [id number] NAME: [name] [name] Date: 01/27/18   Total time: 30 minutes  Type of Contact: Video Assessment in the PCP Office  Patient consent obtained: Patient consent obtained for Virtual Visit: Yes Reason for Visit today: Reason for Your Call/Visit Today: [facility] Intake Assessment   Treatment History Patient recently received Inpatient Treatment: Have You Recently Been in Any Inpatient Treatment (Hospital/Detox/Crisis Center/28-Day Program)?: No  Facility/Program:  NA  Date of discharge:  NA Patient currently being seen by therapist/psychiatrist: Do You Currently Have a Therapist/Psychiatrist?: No Patient currently receiving the following services: Patient Currently Receiving the Following Services:: (None Reported )  Past Psychiatric History/Hospitalization(s): Anxiety: Yes Bipolar Disorder: Yes Depression: Yes Mania: No Psychosis: No Schizophrenia: No Personality Disorder: No Hospitalization for psychiatric illness: No History of Electroconvulsive Shock Therapy: No Prior Suicide Attempts: No  Clinical Assessment:  PHQ-9 Assessments:  Depression screen [facility] 2/9 01/27/2018 01/27/2018 07/10/2016 11/23/2013 07/31/2013  Decreased Interest 3 - 1 2 3   Down, Depressed, Hopeless 3 3 0 1 3  PHQ - 2 Score 6 3 1 3 6   Altered sleeping 2 2 3 3 3   Tired, decreased energy 2 3 1 3 3   Change in appetite 1 2 1  0 1  Feeling bad or failure about yourself  1 1 0 0 1  Trouble concentrating 0 3 0 3 3  Moving slowly or fidgety/restless 0 0 0 1 1  Suicidal thoughts 0 0 0 0 0  PHQ-9 Score 12 14 6 13 18   Difficult doing work/chores - - Somewhat difficult - -     GAD-7 Assessments: GAD 7 : Generalized Anxiety Score 01/27/2018 01/27/2018  Nervous, Anxious, on Edge 3 3  Control/stop worrying 3 3  Worry too much - different things 3 3  Trouble relaxing 2 3  Restless 1 1  Easily annoyed or irritable 2 3  Afraid - awful might happen 0 0   Total GAD 7 Score 14 16     Social Functioning Social maturity: Social Maturity: Isolates Social judgement: Social Judgement: Normal, "Fish farm manager  Stress Current stressors: Current Stressors: Grief/losses, Unknown cause, Work environment Familial stressors: Familial Stressors: None Sleep: Sleep: No problems Appetite: Appetite: No problems Coping ability: Coping ability: Overwhelmed  Patient taking medications as prescribed: Patient taking medications as prescribed: No(Re-startedon her medication today.)  Current medications:  Outpatient Encounter Medications as of 01/27/2018  Medication Sig  . busPIRone (BUSPAR) 5 MG tablet Take 1 tablet (5 mg total) by mouth 2 (two) times daily.  . clonazePAM (KLONOPIN) 1 MG tablet One tablet at bedtime for extreme uncontrolled anxiety  . FLUoxetine (PROZAC) 20 MG tablet Take 1 tablet (20 mg total) by mouth daily.  . lansoprazole (PREVACID) 30 MG capsule Take 1 capsule (30 mg total) by mouth 2 (two) times daily before a meal.  . Norethindrone Acetate-Ethinyl Estrad-FE (JUNEL FE 24) 1-20 MG-MCG(24) tablet Take 1 tablet by mouth daily. Take continuously, do not take 4 inert tabs  . pantoprazole (PROTONIX) 40 MG tablet TAKE 1 TABLET ONCE DAILY.  . rosuvastatin (CRESTOR) 10 MG tablet Take 1 tablet (10 mg total) by mouth daily.   Facility-Administered Encounter Medications as of 01/27/2018  Medication  . miconazole (MONISTAT 7) 2 % vaginal cream 1 Applicatorful  . triamcinolone acetonide (KENALOG) 10 MG/ML injection 10 mg    Self-harm Behaviors Risk Assessment Self-harm risk factors: Self-harm risk factors: (None Reported) Patient endorses recent thoughts of harming self: Have you recently had any  thoughts about harming yourself?: No    Danger to Others Risk Assessment Danger to others risk factors: Danger to Others Risk Factors: No risk factors noted Patient endorses recent thoughts of harming others: Notification required: No need or identified  person   Substance Use Assessment Patient recently consumed alcohol: Have you recently consumed alcohol?: No  Patient recently used drugs: Have you recently used any drugs?: No  Patient is concerned about dependence or abuse of substances: Does patient seem concerned about dependence or abuse of any substance?: No    Goals, Interventions and Follow-up Plan  Goals: Increase healthy adjustment to current life circumstances and Improve medication compliance  Interventions: Motivational Interviewing, Solution-Focused Strategies, Mindfulness or Psychologist, educational, Behavioral Activation, Brief CBT, Supportive Counseling and Medication Monitoring  Follow-up Plan: 1.  Re-start taking psychiatric medication   2.  Start exercising as a coping mechanisn     3.  Exploring how to discussherfeeings withut becoming anxious 4.  Follow up call on 01-29-2018 at 12:30pm in order to discuss compliance with medication,   Summary of Clinical Assessment Summary:   Patient is a 36-yearn old [place] American female that reports increased depression and anxiety since she stopped taking her psychiatric medication three weeks ago.   Patient reports that she does not like to take medication.  Patient reports that she only want to take a single pill or her depression and anxiety.   Patient reports depression associated with the death of her father in 39 and her brother in 2016.  Patient reports receiving outpatient therapy with Maurice Small in the [facility].    Graciella Freer La[place], LCAS-A

## 2018-01-27 NOTE — Progress Notes (Signed)
   [name] [name]     MRN: [id number]      DOB: Apr 13, 1971   HPI [name] [name] is here for follow up and re-evaluation of chronic medical conditions, medication management and review of any available recent lab and radiology data. Concerned about elevated cholesterol and wants to take medication to assist in getting this down. Preventive health is updated, specifically  Cancer screening and Immunization.   Heartburn x 3 months despite protonix 40 mg Increased and uncontroled  Wants to try another medication  ROS Denies recent fever or chills. Denies sinus pressure, nasal congestion, ear pain or sore throat. Denies chest congestion, productive cough or wheezing. Denies chest pains, palpitations and leg swelling Denies  vomiting,diarrhea or constipation.   Denies dysuria, frequency, hesitancy or incontinence. Denies joint pain, swelling and limitation in mobility. Denies headaches, seizures, numbness or tingling. Denies skin break down or rash.   PE  BP 118/80   Pulse 72   Resp 16   Ht 5\' 1"  (1.549 m)   Wt 132 lb 6.4 oz (60.1 kg)   SpO2 98%   BMI 25.02 kg/m   Patient alert and oriented and in no cardiopulmonary distress.  HEENT: No facial asymmetry, EOMI,   oropharynx pink and moist.  Neck supple no JVD, no mass.  Chest: Clear to auscultation bilaterally.  CVS: S1, S2 no murmurs, no S3.Regular rate.  ABD: Soft non tender.   Ext: No edema  MS: Adequate ROM spine, shoulders, hips and knees.  Skin: Intact, no ulcerations or rash noted.  Psych: Good eye contact, normal affect. Memory intact not anxious or depressed appearing.  CNS: CN 2-12 intact, power,  normal throughout.no focal deficits noted.   Assessment & Plan  GERD Symptomatic on protonix 40 mg twice daily, will change to prevacid, re educated pt about the  Need to avoid caffeine and not eat more than 3 hrs before sleep  Hyperlipidemia LDL goal <100 Elevated LDL, low fat diet discussed , pt to start  statin and lab will be repeated in 6 months   Insomnia Sleep hygiene reviewed and written information offered also. Prescription sent for  medication needed.on an intermittent basis , not regularly   Generalized anxiety disorder Start buspar twice daily and refer to telepsych, also pt to try to start regular exercise and work on improved sleep  Depression, recurrent ([name] [name]) Start fluoxetine 20 mg and refer to telepsychiatry

## 2018-01-28 ENCOUNTER — Encounter (HOSPITAL_COMMUNITY): Payer: Self-pay | Admitting: Psychiatry

## 2018-01-29 ENCOUNTER — Telehealth: Payer: Self-pay | Admitting: Family Medicine

## 2018-01-29 ENCOUNTER — Other Ambulatory Visit: Payer: Self-pay

## 2018-01-29 NOTE — Progress Notes (Signed)
[facility]) Psychiatric Consultant Case Review   Summary [name] [name] is a 47 y.o. year old female with history of adjustment disorder. She self discontinued medication as she did not want to take too many medication. She reinitiated medication a few weeks ago.   Psychosocial factors: death of her father in 3 and her brother in 2016.  Current Medications Current Outpatient Medications on File Prior to Visit  Medication Sig Dispense Refill  . busPIRone (BUSPAR) 5 MG tablet Take 1 tablet (5 mg total) by mouth 2 (two) times daily. 60 tablet 5  . clonazePAM (KLONOPIN) 1 MG tablet One tablet at bedtime for extreme uncontrolled anxiety 20 tablet 0  . FLUoxetine (PROZAC) 20 MG tablet Take 1 tablet (20 mg total) by mouth daily. 30 tablet 3  . lansoprazole (PREVACID) 30 MG capsule Take 1 capsule (30 mg total) by mouth 2 (two) times daily before a meal. 60 capsule 5  . Norethindrone Acetate-Ethinyl Estrad-FE (JUNEL FE 24) 1-20 MG-MCG(24) tablet Take 1 tablet by mouth daily. Take continuously, do not take 4 inert tabs 28 tablet 6  . pantoprazole (PROTONIX) 40 MG tablet TAKE 1 TABLET ONCE DAILY. 90 tablet 0  . rosuvastatin (CRESTOR) 10 MG tablet Take 1 tablet (10 mg total) by mouth daily. 30 tablet 5   Current Facility-Administered Medications on File Prior to Visit  Medication Dose Route Frequency Provider Last Rate Last Dose  . miconazole (MONISTAT 7) 2 % vaginal cream 1 Applicatorful  1 Applicatorful Vaginal QHS Jonnie Kind, MD      . triamcinolone acetonide (KENALOG) 10 MG/ML injection 10 mg  10 mg Other Once [name], DPM         Past psychiatry history Outpatient: used to be seen by Ms. Bynum in 2014, Dr. Harrington Challenger, last seen in 2017. Fluoxetine was switched to sertraline at the last encounter Psychiatry admission: denies Previous suicide attempt: denies Past trials of medication: sertraline, fluoxetine, clonazepam, Buspar History of violence:    Current measures Depression screen [facility] 2/9 01/27/2018 01/27/2018 07/10/2016 11/23/2013 07/31/2013  Decreased Interest 3 - 1 2 3   Down, Depressed, Hopeless 3 3 0 1 3  PHQ - 2 Score 6 3 1 3 6   Altered sleeping 2 2 3 3 3   Tired, decreased energy 2 3 1 3 3   Change in appetite 1 2 1  0 1  Feeling bad or failure about yourself  1 1 0 0 1  Trouble concentrating 0 3 0 3 3  Moving slowly or fidgety/restless 0 0 0 1 1  Suicidal thoughts 0 0 0 0 0  PHQ-9 Score 12 14 6 13 18   Difficult doing work/chores - - Somewhat difficult - -   GAD 7 : Generalized Anxiety Score 01/27/2018 01/27/2018  Nervous, Anxious, on Edge 3 3  Control/stop worrying 3 3  Worry too much - different things 3 3  Trouble relaxing 2 3  Restless 1 1  Easily annoyed or irritable 2 3  Afraid - awful might happen 0 0  Total GAD 7 Score 14 16    Goals (patient centered) Increase healthy adjustment to current life circumstances  Assessment/Provisional Diagnosis Adjustment disorder with depressed mood, anxiety  R/o MDD Given her medication is reinitiated recently, would recommend to continue current dose. Noted that patient reports preference to be on only one psychotropics. Buspar/clonazepam may be tapered if she has limited benefit from this medication.   Recommendation - Continue fluoxetine 20 mg daily. Consider uptitration to 40  mg daily as indicated in the future - Continue Buspar 5 mg twice a day  - She is on clonazepam 1 mg daily prn for anxiety - Consider checking TSH, Hb to rule out treatable cause of depression  - [place] specialist to coach behavioral activation  Thank you for your consult. We will continue to follow the patient. Please contact [facility]  for any questions or concerns.   The above treatment considerations and suggestions are based on consultation with the [facility] specialist and/or PCP and a review of information available in the shared registry and the patient's [place] Record (EHR). I have not personally  examined the patient. All recommendations should be implemented with consideration of the patient's relevant prior history and current clinical status. Please feel free to call me with any questions about the care of this patient.

## 2018-01-29 NOTE — Telephone Encounter (Signed)
This encounter was created in error - please disregard.

## 2018-01-29 NOTE — Telephone Encounter (Signed)
lansoprazole (PREVACID) 30 MG capsule 60 capsule 5 01/27/2018    Sig - Route: Take 1 capsule (30 mg total) by mouth 2 (two) times daily before a meal. - Oral   Sent to pharmacy as: lansoprazole (PREVACID) 30 MG capsule   E-Prescribing Status: Receipt confirmed by pharmacy (01/27/2018 3:58 PM EDT)    I called the pharmacy talked to [facility]- and he stated it needed a prior auth since it is written for more than 1--Please complete the Auth for the Pt.  Please call her back also and let her know what is going on

## 2018-01-30 NOTE — Telephone Encounter (Signed)
[name] [name]--is calling wanting to know about the medicine--we need to get a authorization on it for the member.  Please let me know if you need help

## 2018-01-30 NOTE — Telephone Encounter (Signed)
I spoke with patient before lunch and told her it was complete and I was just waiting on a decision

## 2018-01-30 NOTE — Telephone Encounter (Signed)
I have faxed her pharmacy but they didn't respond. I need to know what prescription drug coverage she has and the member ID. Tried to do the authorization yesterday but I didn't have her rx Environmental consultant. Can you call her pharmacy and find out that info for me if you have time?

## 2018-01-30 NOTE — Telephone Encounter (Signed)
Patient called in at 1:05 to check the status of medicaton prior auth Lasoprazole . She states that she has no separate coverage for rx, the pharmacy has the same insurance information as [facility].

## 2018-02-03 ENCOUNTER — Other Ambulatory Visit: Payer: Self-pay | Admitting: Family Medicine

## 2018-02-03 ENCOUNTER — Telehealth: Payer: Self-pay

## 2018-02-03 MED ORDER — ESOMEPRAZOLE MAGNESIUM 40 MG PO CPDR: 40.0000 mg | DELAYED_RELEASE_CAPSULE | Freq: Every day | ORAL | 5 refills | Status: DC

## 2018-02-03 NOTE — Telephone Encounter (Signed)
Insurance will not pay for the lansoprazole and she cannot afford it. Wants it changed to the preferred Esomeprazole instead.  Please send to her pharmacy

## 2018-02-03 NOTE — Telephone Encounter (Signed)
done

## 2018-02-08 ENCOUNTER — Encounter: Payer: Self-pay | Admitting: Family Medicine

## 2018-02-08 NOTE — Assessment & Plan Note (Signed)
Elevated LDL, low fat diet discussed , pt to start statin and lab will be repeated in 6 months

## 2018-02-08 NOTE — Assessment & Plan Note (Signed)
Sleep hygiene reviewed and written information offered also. Prescription sent for  medication needed.on an intermittent basis , not regularly

## 2018-02-08 NOTE — Assessment & Plan Note (Signed)
Start fluoxetine 20 mg and refer to telepsychiatry

## 2018-02-08 NOTE — Assessment & Plan Note (Signed)
Start buspar twice daily and refer to telepsych, also pt to try to start regular exercise and work on improved sleep

## 2018-02-18 ENCOUNTER — Telehealth: Payer: Self-pay

## 2018-02-18 NOTE — Telephone Encounter (Signed)
Writer left a message on [phone] and 02-18-2018

## 2018-03-10 ENCOUNTER — Other Ambulatory Visit: Payer: Self-pay | Admitting: Adult Health

## 2018-04-01 ENCOUNTER — Other Ambulatory Visit: Payer: Self-pay | Admitting: Adult Health

## 2018-04-01 ENCOUNTER — Other Ambulatory Visit: Payer: Self-pay | Admitting: Obstetrics and Gynecology

## 2018-04-04 ENCOUNTER — Telehealth: Payer: Self-pay

## 2018-04-04 NOTE — Telephone Encounter (Signed)
2nd attempt - [facility]   

## 2018-04-15 ENCOUNTER — Telehealth: Payer: Self-pay

## 2018-04-15 NOTE — Telephone Encounter (Signed)
3rd attempt - [facility]  

## 2018-04-28 ENCOUNTER — Telehealth: Payer: Self-pay

## 2018-04-28 NOTE — Telephone Encounter (Signed)
Several attempts have been made to contact patient without success. Patient will be placed on the inactive list.  If services are needed again.  Please contact [facility] at [phone].    Information will be routed to the PCP and Dr. Hisada  

## 2018-05-09 ENCOUNTER — Ambulatory Visit: Payer: Commercial Managed Care - PPO | Admitting: Family Medicine

## 2018-05-09 ENCOUNTER — Other Ambulatory Visit: Payer: Self-pay

## 2018-05-09 ENCOUNTER — Encounter (HOSPITAL_COMMUNITY): Payer: Self-pay | Admitting: Emergency Medicine

## 2018-05-09 ENCOUNTER — Other Ambulatory Visit (HOSPITAL_COMMUNITY)
Admission: RE | Admit: 2018-05-09 | Discharge: 2018-05-09 | Disposition: A | Discharge status: Home / Self Care | Payer: Commercial Managed Care - PPO | Source: Ambulatory Visit | Attending: Family Medicine | Admitting: Family Medicine

## 2018-05-09 ENCOUNTER — Encounter: Payer: Self-pay | Admitting: Family Medicine

## 2018-05-09 ENCOUNTER — Emergency Department (HOSPITAL_COMMUNITY)
Admission: EM | Admit: 2018-05-09 | Discharge: 2018-05-09 | Disposition: A | Discharge status: Home / Self Care | Payer: Commercial Managed Care - PPO | Attending: Emergency Medicine | Admitting: Emergency Medicine

## 2018-05-09 VITALS — BP 110/80 | HR 95 | Temp 98.2°F | Resp 14 | Ht 60.0 in | Wt 120.1 lb

## 2018-05-09 DIAGNOSIS — R5383 Other fatigue: Secondary | ICD-10-CM

## 2018-05-09 DIAGNOSIS — N939 Abnormal uterine and vaginal bleeding, unspecified: Secondary | ICD-10-CM | POA: Insufficient documentation

## 2018-05-09 DIAGNOSIS — Z8742 Personal history of other diseases of the female genital tract: Secondary | ICD-10-CM

## 2018-05-09 DIAGNOSIS — Z79899 Other long term (current) drug therapy: Secondary | ICD-10-CM | POA: Insufficient documentation

## 2018-05-09 DIAGNOSIS — Z87891 Personal history of nicotine dependence: Secondary | ICD-10-CM | POA: Insufficient documentation

## 2018-05-09 DIAGNOSIS — E785 Hyperlipidemia, unspecified: Secondary | ICD-10-CM | POA: Diagnosis not present

## 2018-05-09 DIAGNOSIS — D649 Anemia, unspecified: Secondary | ICD-10-CM

## 2018-05-09 DIAGNOSIS — I10 Essential (primary) hypertension: Secondary | ICD-10-CM | POA: Diagnosis not present

## 2018-05-09 LAB — CBC WITH DIFFERENTIAL/PLATELET
Basophils Absolute: 0 10*3/uL (ref 0.0–0.1)
Basophils Absolute: 0 10*3/uL (ref 0.0–0.1)
Basophils Relative: 1 %
Basophils Relative: 0 %
Eosinophils Absolute: 0 10*3/uL (ref 0.0–0.7)
Eosinophils Absolute: 0 10*3/uL (ref 0.0–0.7)
Eosinophils Relative: 0 %
Eosinophils Relative: 0 %
HCT: 25.3 % — ABNORMAL LOW (ref 36.0–46.0)
HCT: 25.7 % — ABNORMAL LOW (ref 36.0–46.0)
Hemoglobin: 8.3 g/dL — ABNORMAL LOW (ref 12.0–15.0)
Hemoglobin: 8.3 g/dL — ABNORMAL LOW (ref 12.0–15.0)
Lymphocytes Relative: 38 %
Lymphocytes Relative: 33 %
Lymphs Abs: 2.8 10*3/uL (ref 0.7–4.0)
Lymphs Abs: 3 10*3/uL (ref 0.7–4.0)
MCH: 28.4 pg (ref 26.0–34.0)
MCH: 27.9 pg (ref 26.0–34.0)
MCHC: 32.8 g/dL (ref 30.0–36.0)
MCHC: 32.3 g/dL (ref 30.0–36.0)
MCV: 86.6 fL (ref 78.0–100.0)
MCV: 86.5 fL (ref 78.0–100.0)
Monocytes Absolute: 0.7 10*3/uL (ref 0.1–1.0)
Monocytes Absolute: 0.8 10*3/uL (ref 0.1–1.0)
Monocytes Relative: 9 %
Monocytes Relative: 9 %
Neutro Abs: 3.9 10*3/uL (ref 1.7–7.7)
Neutro Abs: 5.2 10*3/uL (ref 1.7–7.7)
Neutrophils Relative %: 52 %
Neutrophils Relative %: 58 %
Platelets: 775 10*3/uL — ABNORMAL HIGH (ref 150–400)
Platelets: 734 10*3/uL — ABNORMAL HIGH (ref 150–400)
RBC: 2.92 MIL/uL — ABNORMAL LOW (ref 3.87–5.11)
RBC: 2.97 MIL/uL — ABNORMAL LOW (ref 3.87–5.11)
RDW: 14.3 % (ref 11.5–15.5)
RDW: 14.4 % (ref 11.5–15.5)
WBC: 7.5 10*3/uL (ref 4.0–10.5)
WBC: 9 10*3/uL (ref 4.0–10.5)

## 2018-05-09 LAB — BASIC METABOLIC PANEL
Anion gap: 11 (ref 5–15)
BUN: 14 mg/dL (ref 6–20)
CO2: 24 mmol/L (ref 22–32)
Calcium: 9.3 mg/dL (ref 8.9–10.3)
Chloride: 100 mmol/L (ref 98–111)
Creatinine, Ser: 0.96 mg/dL (ref 0.44–1.00)
GFR calc Af Amer: >60 mL/min (ref >60)
GFR calc non Af Amer: >60 mL/min (ref >60)
Glucose, Bld: 94 mg/dL (ref 70–99)
Potassium: 3.7 mmol/L (ref 3.5–5.1)
Sodium: 135 mmol/L (ref 135–145)

## 2018-05-09 LAB — TYPE AND SCREEN
ABO/RH(D): O POS
Antibody Screen: NEGATIVE

## 2018-05-09 LAB — PROTIME-INR
INR: 0.91
Prothrombin Time: 12.2 seconds (ref 11.4–15.2)

## 2018-05-09 MED ORDER — MEGESTROL ACETATE 40 MG PO TABS: 40.0000 mg | ORAL_TABLET | Freq: Three times a day (TID) | ORAL | 0 refills | Status: DC

## 2018-05-09 MED ORDER — MEDROXYPROGESTERONE ACETATE 400 MG/ML IM SUSP: 400.0000 mg | Freq: Once | INTRAMUSCULAR | Status: DC

## 2018-05-09 MED ORDER — MEGESTROL ACETATE 40 MG PO TABS
120.0000 mg | ORAL_TABLET | Freq: Every day | ORAL | Status: DC
  Administered 2018-05-09: 120 mg via ORAL
  Filled 2018-05-09 (×4): qty 3

## 2018-05-09 MED ORDER — MEDROXYPROGESTERONE ACETATE 150 MG/ML IM SUSP
150.0000 mg | Freq: Once | INTRAMUSCULAR | Status: AC
  Administered 2018-05-09: 150 mg via INTRAMUSCULAR
  Filled 2018-05-09: qty 1

## 2018-05-09 NOTE — Discharge Instructions (Signed)
Please stop taking the birth control tablet and continue taking the Gaster all 3 times daily until you follow-up with your OB/GYN at your appointment on September 23.  You should return to the emergency department for increasing bleeding or more symptoms including lightheadedness difficulty breathing or weakness.

## 2018-05-09 NOTE — ED Provider Notes (Signed)
[facility] EMERGENCY DEPARTMENT Provider Note   CSN: [id number] Arrival date & time: 05/09/18  1037     History   Chief Complaint Chief Complaint  Patient presents with  . Abnormal Lab    HPI [name] [name] is a 47 y.o. female.  HPI  47 year old female, she has a known history of vaginal bleeding which has been somewhat irregular which she states is secondary to having fibroids as well as having some irregular menstruation, she has been placed on different medications including birth control as well as Megesterol which she reports has not seemed to do that much.  She was seen today at her family doctor's office, she was told that she was anemic, she had been complaining of some lightheadedness, some generalized weakness on exertion, feeling like she was near syncopal with standing.  This is been progressive and she reports that she has had some associated abnormal vaginal bleeding since August 26, this is very long for her and she usually does not bleed that much.  [facility]  Past Medical History:  Diagnosis Date  . Abnormal vaginal Pap smear    Dr Glo Herring  . Allergic rhinitis   . BV (bacterial vaginosis) 02/09/2015  . Fibroids 10/28/2013  . Hyperlipidemia    x4 years   . Hypertension    whenshe wasa young but she changed diet and reducd salt intake   . Irregular intermenstrual bleeding 10/28/2013  . Kidney stones   . Mental disorder   . Unspecified symptom associated with female genital organs 10/28/2013  . Vaginal discharge 02/09/2015  . Vaginal odor 02/09/2015  . Vaginal Pap smear, abnormal     Patient Active Problem List   Diagnosis Date Noted  . Abnormal uterine bleeding (AUB) 04/01/2017  . Generalized anxiety disorder 11/23/2013  . Fibroids, submucosal 10/28/2013  . Depression, recurrent ([place]) 07/31/2013  . Insomnia 07/31/2013  . Vitamin D deficiency 12/24/2010  . Hyperlipidemia LDL goal <100 11/30/2009  . Allergic  rhinitis 07/27/2009  . GERD 07/27/2009    Past Surgical History:  Procedure Laterality Date  . BREAST REDUCTION SURGERY  Jan 2007  . CERVICAL BIOPSY  2010   for abnormal pap   . cyst removed from both breast , all benign both breast    . DILITATION & CURRETTAGE/HYSTROSCOPY WITH THERMACHOICE ABLATION N/A 12/15/2013   Procedure: DILATATION & CURETTAGE/HYSTEROSCOPY WITH THERMACHOICE ABLATION;  Surgeon: Jonnie Kind, MD;  Location: [facility];  Service: Gynecology;  Laterality: N/A;  total therapy time:30min 1sec D5W  86ml in, D5W  75ml out Temperature 87 degree c.  . EXCISION OF SKIN TAG Right 12/15/2013   Procedure: EXCISION OF SKIN TAG;  Surgeon: Jonnie Kind, MD;  Location: [facility];  Service: Gynecology;  Laterality: Right;  . LAPAROSCOPIC BILATERAL SALPINGECTOMY Bilateral 12/15/2013   Procedure: LAPAROSCOPIC BILATERAL SALPINGECTOMY;  Surgeon: Jonnie Kind, MD;  Location: [facility];  Service: Gynecology;  Laterality: Bilateral;  . REMOVAL OF NON VAGINAL CONTRACEPTIVE DEVICE Left 12/15/2013   Procedure: REMOVAL OF NON VAGINAL CONTRACEPTIVE [place];  Surgeon: Jonnie Kind, MD;  Location: [facility];  Service: Gynecology;  Laterality: Left;     OB History    Gravida  2   Para  0   Term  0   Preterm      AB  2   Living        SAB      TAB  2   Ectopic  Multiple      Live Births               Home Medications    Prior to Admission medications   Medication Sig Start Date End Date Taking? Authorizing Provider  busPIRone (BUSPAR) 5 MG tablet Take 1 tablet (5 mg total) by mouth 2 (two) times daily. 01/27/18  Yes Fayrene Helper, MD  clonazePAM Bobbye [place]) 1 MG tablet One tablet at bedtime for extreme uncontrolled anxiety 01/27/18  Yes Fayrene Helper, MD  FLUoxetine (PROZAC) 20 MG tablet Take 1 tablet (20 mg total) by mouth daily. 01/27/18  Yes Fayrene Helper, MD  lansoprazole (PREVACID) 30 MG capsule Take 30 mg by mouth daily at 12 noon.   Yes [provider]  rosuvastatin (CRESTOR) 10 MG tablet Take 1 tablet (10 mg total) by mouth daily. 01/27/18  Yes Fayrene Helper, MD  megestrol (MEGACE) 40 MG tablet Take 1 tablet (40 mg total) by mouth 3 (three) times daily for 10 days. Take twice daily until bleeding stops - consult with your Gynecologist before starting this medicine 05/09/18 05/19/18  Noemi Chapel, MD    Family History Family History  Problem Relation Age of Onset  . Fibromyalgia Mother   . Hypertension Mother   . Thyroid disease Mother   . Arthritis Mother   . Alcohol abuse Father   . COPD Father   . Bipolar disorder Father   . Bipolar disorder Sister   . Anxiety disorder Sister        has panic attacks  . Bipolar disorder Brother   . Alcohol abuse Brother   . Alcohol abuse Brother   . Alcohol abuse Brother   . Other Brother        handicap  . Alcohol abuse Brother   . Diabetes Maternal Grandfather   . Other Paternal Grandfather        commited suicide    Social History Social History   Tobacco Use  . Smoking status: Former Smoker    Packs/day: 0.20    Years: 20.00    Pack years: 4.00    Types: Cigarettes    Last attempt to quit: 06/05/2009    Years since quitting: 8.9  . Smokeless tobacco: Never Used  Substance Use Topics  . Alcohol use: Yes    Comment: occasionally, couple drinks per mo  . Drug use: No     Allergies   Patient has no known allergies.   Review of Systems Review of Systems  All other systems reviewed and are negative.    Physical Exam Updated Vital Signs BP 127/83 (BP Location: Left Arm)   Pulse 94   Temp 98.8 F (37.1 C) (Oral)   Resp 18   Ht 1.524 m (5')   Wt 54.4 kg   LMP 04/18/2018 Comment: States she is still bleeding from menstrual cycle  SpO2 100%   BMI 23.44 kg/m   Physical Exam  Constitutional: She appears well-developed and well-nourished. No distress.  HENT:  Head: Normocephalic and atraumatic.  Mouth/Throat: Oropharynx is clear and moist. No  oropharyngeal exudate.  Mucous membranes are slightly pale  Eyes: Pupils are equal, round, and reactive to light. EOM are normal. Right eye exhibits no discharge. Left eye exhibits no discharge. No scleral icterus.  Conjunctive a pale  Neck: Normal range of motion. Neck supple. No JVD present. No thyromegaly present.  Cardiovascular: Normal rate, regular rhythm, normal heart sounds and intact distal pulses. Exam reveals no gallop  and no friction rub.  No murmur heard. Pulmonary/Chest: Effort normal and breath sounds normal. No respiratory distress. She has no wheezes. She has no rales.  Abdominal: Soft. Bowel sounds are normal. She exhibits no distension and no mass. There is no tenderness.  No abdominal tenderness to palpation  Musculoskeletal: Normal range of motion. She exhibits no edema or tenderness.  Lymphadenopathy:    She has no cervical adenopathy.  Neurological: She is alert. Coordination normal.  Skin: Skin is warm and dry. No rash noted. No erythema.  Psychiatric: She has a normal mood and affect. Her behavior is normal.  Nursing note and vitals reviewed.    ED Treatments / Results  Labs (all labs ordered are listed, but only abnormal results are displayed) Labs Reviewed  CBC WITH DIFFERENTIAL/PLATELET - Abnormal; Notable for the following components:      Result Value   RBC 2.97 (*)    Hemoglobin 8.3 (*)    HCT 25.7 (*)    Platelets 734 (*)    All other components within normal limits  BASIC METABOLIC PANEL  TYPE AND SCREEN    EKG None  Radiology No results found.  Procedures Procedures (including critical care time)  Medications Ordered in ED Medications  megestrol (MEGACE) tablet 120 mg (120 mg Oral Given 05/09/18 1418)  medroxyPROGESTERone (DEPO-PROVERA) injection 150 mg (150 mg Intramuscular Given 05/09/18 1419)     Initial Impression / Assessment and Plan / ED Course  I have reviewed the triage vital signs and the nursing notes.  Pertinent labs &  imaging results that were available during my care of the patient were reviewed by me and considered in my medical decision making (see chart for details).     The patient's vital signs are unremarkable, she has no hypotension, no tachycardia, her exam is unremarkable except for her pale mucous membranes and conjunctiva suggestive of an anemic state.  Reportedly the hemoglobin was just above 8, the last test that we have in our system are from 3 years ago when she had a hemoglobin of over 14.  The patient reports she has had labs done at an outside hospital and is trying to pull up those records at this time.  Will discuss with gynecology regarding medications and follow-up versus transfusion.  Discussed with OB/GYN, recommends 120 mg of my Gaster all immediately as well as medroxyprogesterone, the hospital only carries 150 mg total, this was given instead of the 300 mg as this is all that we have.  I confirmed this dose with Dr. Glo Herring who said that that was acceptable and that she should be able to follow-up in the office.  Discussed the patient's results and treatment plan with her and she is in total agreement.  Vitals:   05/09/18 1102 05/09/18 1424  BP: (!) 141/82 127/83  Pulse: 92 94  Resp: 16 18  Temp: 98.8 F (37.1 C)   TempSrc: Oral   SpO2: 100% 100%  Weight: 54.4 kg   Height: 1.524 m (5')      Final Clinical Impressions(s) / ED Diagnoses   Final diagnoses:  History of heavy vaginal bleeding  Symptomatic anemia    ED Discharge Orders         Ordered    megestrol (MEGACE) 40 MG tablet  3 times daily     05/09/18 1449           Noemi Chapel, MD 05/09/18 1450

## 2018-05-09 NOTE — ED Triage Notes (Addendum)
Patient sent from Dr Mannie Stabile for hemoglobin of 8.3. Patient complains of dizziness. Patient states she had menstrual cycle on 04/18/18 and has not stopped bleeding at this time.

## 2018-05-09 NOTE — Progress Notes (Signed)
Patient ID: [name] [name], female    DOB: 1971-04-30, 47 y.o.   MRN: [id number]  Chief Complaint  Patient presents with  . blood pressure has been running high  . Fatigue    Allergies Patient has no known allergies.  Subjective:   [name] [name] is a 47 y.o. female who presents to [facility] today.  HPI Here for visit b/c feels really tired, low energy, dizzy, and short of breath with exertion.  Is currently being seen and evaluated by gynecology for uterine bleeding. She reports that she has been bleeding for 3 weeks. Has been put on megace and also on OCP. Has talked with gynecology and is going to see them on Monday. Is having a consultation for hysterectomy with Dr. Elonda Husky later on next week. Reports that she has had 2 change pad every 30-60 minutes.   Denies any syncopal episodes.  Denies any chest pain.  Reports that she does feel winded, short of breath, like her heart is racing when she exerts herself.  She reports her friends at work and told her she looks very pale.  She denies any melena, bright red blood per rectum.  She has never been told she has any bleeding diathesis.  She is not on any iron supplementation.  She has never received a blood transfusion.  She reports she tried to call her gynecologist office is not been able to get through to them.  She is usually followed by Dr. Moshe Cipro for routine care.  She reports that she has recently lost some weight because she is does not even have the energy to eat or cook.   Past Medical History:  Diagnosis Date  . Abnormal vaginal Pap smear    Dr Glo Herring  . Allergic rhinitis   . BV (bacterial vaginosis) 02/09/2015  . Fibroids 10/28/2013  . Hyperlipidemia    x4 years   . Hypertension    whenshe wasa young but she changed diet and reducd salt intake   . Irregular intermenstrual bleeding 10/28/2013  . Kidney stones   . Mental disorder   . Unspecified symptom associated with female genital organs 10/28/2013    . Vaginal discharge 02/09/2015  . Vaginal odor 02/09/2015  . Vaginal Pap smear, abnormal     Past Surgical History:  Procedure Laterality Date  . BREAST REDUCTION SURGERY  Jan 2007  . CERVICAL BIOPSY  2010   for abnormal pap   . cyst removed from both breast , all benign both breast    . DILITATION & CURRETTAGE/HYSTROSCOPY WITH THERMACHOICE ABLATION N/A 12/15/2013   Procedure: DILATATION & CURETTAGE/HYSTEROSCOPY WITH THERMACHOICE ABLATION;  Surgeon: Jonnie Kind, MD;  Location: [facility];  Service: Gynecology;  Laterality: N/A;  total therapy time:80min 1sec D5W  59ml in, D5W  18ml out Temperature 87 degree c.  . EXCISION OF SKIN TAG Right 12/15/2013   Procedure: EXCISION OF SKIN TAG;  Surgeon: Jonnie Kind, MD;  Location: [facility];  Service: Gynecology;  Laterality: Right;  . LAPAROSCOPIC BILATERAL SALPINGECTOMY Bilateral 12/15/2013   Procedure: LAPAROSCOPIC BILATERAL SALPINGECTOMY;  Surgeon: Jonnie Kind, MD;  Location: [facility];  Service: Gynecology;  Laterality: Bilateral;  . REMOVAL OF NON VAGINAL CONTRACEPTIVE DEVICE Left 12/15/2013   Procedure: REMOVAL OF NON VAGINAL CONTRACEPTIVE [place];  Surgeon: Jonnie Kind, MD;  Location: [facility];  Service: Gynecology;  Laterality: Left;    Family History  Problem Relation Age of Onset  . Fibromyalgia Mother   . Hypertension  Mother   . Thyroid disease Mother   . Arthritis Mother   . Alcohol abuse Father   . COPD Father   . Bipolar disorder Father   . Bipolar disorder Sister   . Anxiety disorder Sister        has panic attacks  . Bipolar disorder Brother   . Alcohol abuse Brother   . Alcohol abuse Brother   . Alcohol abuse Brother   . Other Brother        handicap  . Alcohol abuse Brother   . Diabetes Maternal Grandfather   . Other Paternal Grandfather        commited suicide     Social History   Socioeconomic History  . Marital status: Single    Spouse name: Not on file  . Number of children: 0  . Years of  education: Not on file  . Highest education level: Not on file  Occupational History  . 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 Financial resource strain: Not on file  . Food insecurity:    Worry: Not on file    Inability: Not on file  . Transportation needs:    Medical: Not on file    Non-medical: Not on file  Tobacco Use  . Smoking status: Former Smoker    Packs/day: 0.20    Years: 20.00    Pack years: 4.00    Types: Cigarettes    Last attempt to quit: 06/05/2009    Years since quitting: 8.9  . Smokeless tobacco: Never Used  Substance and Sexual Activity  . Alcohol use: Yes    Comment: occasionally, couple drinks per mo  . Drug use: No  . Sexual activity: Yes    Birth control/protection: Surgical    Comment: ablation  Lifestyle  . Physical activity:    Days per week: Not on file    Minutes per session: Not on file  . Stress: Not on file  Relationships  . Social connections:    Talks on phone: Not on file    Gets together: Not on file    Attends religious service: Not on file    Active member of club or organization: Not on file    Attends meetings of clubs or organizations: Not on file    Relationship status: Not on file  Other Topics Concern  . Not on file  Social History Narrative  . Not on file   Current Outpatient Medications on File Prior to Visit  Medication Sig Dispense Refill  . busPIRone (BUSPAR) 5 MG tablet Take 1 tablet (5 mg total) by mouth 2 (two) times daily. 60 tablet 5  . clonazePAM (KLONOPIN) 1 MG tablet One tablet at bedtime for extreme uncontrolled anxiety 20 tablet 0  . esomeprazole (NEXIUM) 40 MG capsule Take 1 capsule (40 mg total) by mouth daily at 12 noon. 30 capsule 5  . FLUoxetine (PROZAC) 20 MG tablet Take 1 tablet (20 mg total) by mouth daily. 30 tablet 3  . megestrol (MEGACE) 40 MG tablet TAKE 1 TABLET BY  MOUTH THREE TIMES DAILY UNTIL BLEEDING STOPS 45 tablet 0  . Norethindrone Acetate-Ethinyl Estrad-FE (JUNEL FE 24) 1-20 MG-MCG(24) tablet Take 1 tablet by mouth daily. Take continuously, do not take 4 inert tabs 28 tablet 6  . rosuvastatin (CRESTOR) 10 MG tablet Take 1 tablet (  10 mg total) by mouth daily. 30 tablet 5   Current Facility-Administered Medications on File Prior to Visit  Medication Dose Route Frequency Provider Last Rate Last Dose  . miconazole (MONISTAT 7) 2 % vaginal cream 1 Applicatorful  1 Applicatorful Vaginal QHS Jonnie Kind, MD      . triamcinolone acetonide (KENALOG) 10 MG/ML injection 10 mg  10 mg Other Once [name], DPM        Review of Systems  Constitutional: Positive for activity change, appetite change and fatigue. Negative for fever.  HENT: Negative for trouble swallowing.   Respiratory: Positive for shortness of breath. Negative for apnea, cough, choking, chest tightness, wheezing and stridor.   Cardiovascular: Positive for palpitations. Negative for chest pain and leg swelling.  Gastrointestinal: Negative for abdominal pain, anal bleeding, blood in stool, diarrhea, nausea and rectal pain.  Endocrine: Negative for polyuria.  Genitourinary: Positive for vaginal bleeding.  Musculoskeletal: Negative for neck pain.  Neurological: Positive for dizziness and light-headedness. Negative for tremors, syncope and headaches.  Psychiatric/Behavioral: Negative for dysphoric mood. The patient is not nervous/anxious.      Objective:   BP 110/80 (BP Location: Right Arm, Patient Position: Sitting, Cuff Size: Normal)   Pulse 95   Temp 98.2 F (36.8 C) (Temporal)   Resp 14   Ht 5' (1.524 m)   Wt 120 lb 1.9 oz (54.5 kg)   SpO2 100% Comment: room air  BMI 23.46 kg/m   Physical Exam  Constitutional: She is oriented to person, place, and time. She appears well-developed and well-nourished. No distress.  HENT:  Head: Normocephalic and atraumatic.    Mouth/Throat: Oropharynx is clear and moist.  Eyes: Pupils are equal, round, and reactive to light.  Pale conjunctiva.   Neck: Normal range of motion. Neck supple. No JVD present. No thyromegaly present.  Cardiovascular: Regular rhythm and normal heart sounds. Tachycardia present.  Pulmonary/Chest: Effort normal and breath sounds normal. No respiratory distress. She has no wheezes.  Neurological: She is alert and oriented to person, place, and time. No cranial nerve deficit.  Skin: Skin is warm and dry. There is pallor.  Psychiatric: She has a normal mood and affect. Her behavior is normal. Thought content normal.  Nursing note and vitals reviewed.  Sent patient to hospital for stat labs.  Patient's hemoglobin approximately 8.4.  PT/INR within normal limits.  Assessment and Plan  1. Symptomatic anemia Patient with symptomatic anemia secondary to vaginal bleeding.  She is currently on OCPs and Megace.  She is still experiencing profuse bleeding and symptoms associated with anemia.  Patient does wish to proceed with a transfusion.  I called and spoke with the emergency department to inform them of the situation.  Patient feels stable to go to the emergency department across the street via private vehicle.  She defers EMS transfer.  I have called and alerted the emergency department the patient is on her way.  2. Vaginal bleeding -Consider infusion at emergency department to aid with bleeding cessation. - CBC with Differential/Platelet - INR/PT  3. Fatigue, unspecified type Fatigue secondary to above. Did discuss with patient that she needs to follow with her PCP and gynecology within the next several days after discharge from the emergency department.  She voiced understanding. No follow-ups on file. Caren Macadam, MD 05/09/2018

## 2018-05-12 ENCOUNTER — Ambulatory Visit: Payer: Commercial Managed Care - PPO | Admitting: Women's Health

## 2018-05-12 ENCOUNTER — Ambulatory Visit: Payer: Commercial Managed Care - PPO | Admitting: Obstetrics and Gynecology

## 2018-05-12 ENCOUNTER — Other Ambulatory Visit: Payer: Self-pay

## 2018-05-12 ENCOUNTER — Encounter: Payer: Self-pay | Admitting: Women's Health

## 2018-05-12 VITALS — BP 126/76 | HR 116 | Ht 60.0 in | Wt 123.0 lb

## 2018-05-12 DIAGNOSIS — N760 Acute vaginitis: Secondary | ICD-10-CM

## 2018-05-12 DIAGNOSIS — B9689 Other specified bacterial agents as the cause of diseases classified elsewhere: Secondary | ICD-10-CM | POA: Diagnosis not present

## 2018-05-12 DIAGNOSIS — D649 Anemia, unspecified: Secondary | ICD-10-CM | POA: Diagnosis not present

## 2018-05-12 DIAGNOSIS — N921 Excessive and frequent menstruation with irregular cycle: Secondary | ICD-10-CM | POA: Insufficient documentation

## 2018-05-12 LAB — POCT WET PREP (WET MOUNT)
Clue Cells Wet Prep Whiff POC: POSITIVE
Trichomonas Wet Prep HPF POC: ABSENT

## 2018-05-12 MED ORDER — FERROUS SULFATE 325 (65 FE) MG PO TABS: 325.0000 mg | ORAL_TABLET | Freq: Two times a day (BID) | ORAL | 3 refills | Status: DC

## 2018-05-12 MED ORDER — METRONIDAZOLE 500 MG PO TABS: 500.0000 mg | ORAL_TABLET | Freq: Two times a day (BID) | ORAL | 3 refills | Status: DC

## 2018-05-12 NOTE — Progress Notes (Signed)
GYN VISIT Patient name: [name] [name] MRN [id number]  Date of birth: 05/11/1971 Chief Complaint:   Vaginal Bleeding (since Aug 25; )  History of Present Illness:   [name] [name] is a 47 y.o. G74P0020 African American female being seen today for f/u ED visit for heavy prolonged vaginal bleeding since 8/25. Went to PCP and hgb was 8 so she was sent to ED, was not given Fe rx. Has been taking megace TID 'for awhile' and has done nothing for bleeding. Received depo in ED, bleeding finally starting to slow some. Has appt w/ [facility] on 9/23 to discuss hysterectomy d/t menorrhagia and uterine fibroids. Denies abnormal discharge, itching/odor/irritation.     Patient's last menstrual period was 04/18/2018. The current method of family planning is was on COCs, was told to stop recently. Last pap 07/10/16. Results were:  neg w/ -HRHPV Review of Systems:   Pertinent items are noted in HPI Denies fever/chills, dizziness, headaches, visual disturbances, fatigue, shortness of breath, chest pain, abdominal pain, vomiting, abnormal vaginal discharge/itching/odor/irritation, problems with periods, bowel movements, urination, or intercourse unless otherwise stated above.  Pertinent History Reviewed:  Reviewed past medical,surgical, social, obstetrical and family history.  Reviewed problem list, medications and allergies. Physical Assessment:   Vitals:   05/12/18 0849 05/12/18 0922  BP: (!) 150/78 126/76  Pulse: (!) 118 (!) 116  Weight: 123 lb (55.8 kg)   Height: 5' (1.524 m)   Body mass index is 24.02 kg/m.       Physical Examination:   General appearance: alert, well appearing, and in no distress  Mental status: alert, oriented to person, place, and time  Skin: warm & dry   Cardiovascular: normal heart rate noted  Respiratory: normal respiratory effort, no distress  Abdomen: soft, non-tender   Pelvic: VULVA: normal appearing vulva with no masses, tenderness or lesions, VAGINA: normal appearing  vagina with normal color and small amt pink malodorous discharge, no lesions, no active bleeding CERVIX: normal appearing cervix without discharge or lesions  Extremities: no edema   Results for orders placed or performed in visit on 05/12/18 (from the past 24 hour(s))  POCT Wet Prep [facility])   Collection Time: 05/12/18  3:11 PM  Result Value Ref Range   Source Wet Prep POC vaginal    WBC, Wet Prep HPF POC few    Bacteria Wet Prep HPF POC Few Few   BACTERIA WET PREP MORPHOLOGY POC     Clue Cells Wet Prep HPF POC Many (A) None   Clue Cells Wet Prep Whiff POC Positive Whiff    Yeast Wet Prep HPF POC None None   KOH Wet Prep POC     Trichomonas Wet Prep HPF POC Absent Absent    Assessment & Plan:  1) Menorrhagia w/ irregular cycle, known uterine fibroids> has appt w/ [facility] 9/23 to discuss hysterectomy, continue megace, received depo in ED 9/13  2) BV> Rx metronidazole 500mg  BID x 7d for BV, no sex or etoh while taking   3) Anemia> rx fe bid, take w/ OJ, increase fe-rich foods  Meds:  Meds ordered this encounter  Medications  . metroNIDAZOLE (FLAGYL) 500 MG tablet    Sig: Take 1 tablet (500 mg total) by mouth 2 (two) times daily.    Dispense:  14 tablet    Refill:  3    Order Specific Question:   Supervising Provider    Answer:   Elonda Husky, LUTHER H [[id number]]  . ferrous sulfate 325 (65 FE)  MG tablet    Sig: Take 1 tablet (325 mg total) by mouth 2 (two) times daily with a meal.    Dispense:  60 tablet    Refill:  3    Order Specific Question:   Supervising Provider    Answer:   Tania Ade H [[id number]]    Orders Placed This Encounter  Procedures  . POCT Wet Prep [facility])    Return for As scheduled 9/23 w/ [facility].  [place], [facility] 05/12/2018 3:13 PM

## 2018-05-19 ENCOUNTER — Encounter: Payer: Self-pay | Admitting: Obstetrics & Gynecology

## 2018-05-19 ENCOUNTER — Ambulatory Visit: Payer: Commercial Managed Care - PPO | Admitting: Obstetrics & Gynecology

## 2018-05-19 ENCOUNTER — Encounter: Payer: Commercial Managed Care - PPO | Admitting: Obstetrics & Gynecology

## 2018-05-19 VITALS — BP 134/78 | HR 112 | Ht 60.0 in | Wt 125.0 lb

## 2018-05-19 DIAGNOSIS — N921 Excessive and frequent menstruation with irregular cycle: Secondary | ICD-10-CM | POA: Diagnosis not present

## 2018-05-19 DIAGNOSIS — N941 Unspecified dyspareunia: Secondary | ICD-10-CM

## 2018-05-19 DIAGNOSIS — D649 Anemia, unspecified: Secondary | ICD-10-CM | POA: Diagnosis not present

## 2018-05-19 LAB — POCT HEMOGLOBIN: Hemoglobin: 8.3 g/dL — AB (ref 12.2–16.2)

## 2018-05-19 MED ORDER — MEGESTROL ACETATE 40 MG PO TABS: 40.0000 mg | ORAL_TABLET | Freq: Three times a day (TID) | ORAL | 0 refills | Status: DC

## 2018-05-19 NOTE — Progress Notes (Signed)
Chief Complaint  Patient presents with  . Pre-op Exam      48 y.o. G2P0020 Patient's last menstrual period was 04/20/2018. The current method of family planning is tubal ligation.  Outpatient Encounter Medications as of 05/19/2018  Medication Sig  . busPIRone (BUSPAR) 5 MG tablet Take 1 tablet (5 mg total) by mouth 2 (two) times daily.  . clonazePAM (KLONOPIN) 1 MG tablet One tablet at bedtime for extreme uncontrolled anxiety  . ferrous sulfate 325 (65 FE) MG tablet Take 1 tablet (325 mg total) by mouth 2 (two) times daily with a meal.  . FLUoxetine (PROZAC) 20 MG tablet Take 1 tablet (20 mg total) by mouth daily.  . lansoprazole (PREVACID) 30 MG capsule Take 30 mg by mouth daily at 12 noon.  . megestrol (MEGACE) 40 MG tablet Take 1 tablet (40 mg total) by mouth 3 (three) times daily for 10 days. Take 3 times daily for 10 days  . rosuvastatin (CRESTOR) 10 MG tablet Take 1 tablet (10 mg total) by mouth daily.  . metroNIDAZOLE (FLAGYL) 500 MG tablet Take 1 tablet (500 mg total) by mouth 2 (two) times daily. (Patient not taking: Reported on 05/19/2018)   Facility-Administered Encounter Medications as of 05/19/2018  Medication  . miconazole (MONISTAT 7) 2 % vaginal cream 1 Applicatorful  . triamcinolone acetonide (KENALOG) 10 MG/ML injection 10 mg    Subjective Pt is in as a follow up from the ED S/P ablation 2015 with continued menometrorrhagia, also has bump dyspareunia, 100%, simulated on exam S/p depo provera 300 mg + megestrol 3 per day and still bleeding with anemia, hemoglobin increased from 8.3 to 8.8 today  Since she is s/p ablation will proceed with hysterectomy, bump dyspareunia need cervix removed, wants to preserve ovaries, patient decision Past Medical History:  Diagnosis Date  . Abnormal vaginal Pap smear    Dr Glo Herring  . Allergic rhinitis   . BV (bacterial vaginosis) 02/09/2015  . Fibroids 10/28/2013  . Hyperlipidemia    x4 years   . Hypertension    whenshe  wasa young but she changed diet and reducd salt intake   . Irregular intermenstrual bleeding 10/28/2013  . Kidney stones   . Mental disorder   . Unspecified symptom associated with female genital organs 10/28/2013  . Vaginal discharge 02/09/2015  . Vaginal odor 02/09/2015  . Vaginal Pap smear, abnormal     Past Surgical History:  Procedure Laterality Date  . BREAST REDUCTION SURGERY  Jan 2007  . CERVICAL BIOPSY  2010   for abnormal pap   . cyst removed from both breast , all benign both breast    . DILITATION & CURRETTAGE/HYSTROSCOPY WITH THERMACHOICE ABLATION N/A 12/15/2013   Procedure: DILATATION & CURETTAGE/HYSTEROSCOPY WITH THERMACHOICE ABLATION;  Surgeon: Jonnie Kind, MD;  Location: [facility];  Service: Gynecology;  Laterality: N/A;  total therapy time:68min 1sec D5W  83ml in, D5W  42ml out Temperature 87 degree c.  . EXCISION OF SKIN TAG Right 12/15/2013   Procedure: EXCISION OF SKIN TAG;  Surgeon: Jonnie Kind, MD;  Location: [facility];  Service: Gynecology;  Laterality: Right;  . LAPAROSCOPIC BILATERAL SALPINGECTOMY Bilateral 12/15/2013   Procedure: LAPAROSCOPIC BILATERAL SALPINGECTOMY;  Surgeon: Jonnie Kind, MD;  Location: [facility];  Service: Gynecology;  Laterality: Bilateral;  . REMOVAL OF NON VAGINAL CONTRACEPTIVE DEVICE Left 12/15/2013   Procedure: REMOVAL OF NON VAGINAL CONTRACEPTIVE [place];  Surgeon: Jonnie Kind, MD;  Location: [facility];  Service: Gynecology;  Laterality: Left;    OB History    Gravida  2   Para  0   Term  0   Preterm      AB  2   Living        SAB      TAB  2   Ectopic      Multiple      Live Births              No Known Allergies  Social History   Socioeconomic History  . Marital status: Single    Spouse name: Not on file  . Number of children: 0  . Years of education: Not on file  . Highest education level: Not on file  Occupational History  . 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 Financial resource strain: Not on file  . Food insecurity:    Worry: Not on file    Inability: Not on file  . Transportation needs:    Medical: Not on file    Non-medical: Not on file  Tobacco Use  . Smoking status: Former Smoker    Packs/day: 0.20    Years: 20.00    Pack years: 4.00    Types: Cigarettes    Last attempt to quit: 06/05/2009    Years since quitting: 8.9  . Smokeless tobacco: Never Used  Substance and Sexual Activity  . Alcohol use: Yes    Comment: occasionally, couple drinks per mo  . Drug use: No  . Sexual activity: Not Currently    Birth control/protection: Surgical    Comment: ablation  Lifestyle  . Physical activity:    Days per week: Not on file    Minutes per session: Not on file  . Stress: Not on file  Relationships  . Social connections:    Talks on phone: Not on file    Gets together: Not on file    Attends religious service: Not on file    Active member of club or organization: Not on file    Attends meetings of clubs or organizations: Not on file    Relationship status: Not on file  Other Topics Concern  . Not on file  Social History Narrative  . Not on file    Family History  Problem Relation Age of Onset  . Fibromyalgia Mother   . Hypertension Mother   . Thyroid disease Mother   . Arthritis Mother   . Alcohol abuse Father   . COPD Father   . Bipolar disorder Father   . Bipolar disorder Sister   . Anxiety disorder Sister        has panic attacks  . Bipolar disorder Brother   . Alcohol abuse Brother   . Alcohol abuse Brother   . Alcohol abuse Brother   . Other Brother        handicap  . Alcohol abuse Brother   . Diabetes Maternal Grandfather   . Other Paternal Grandfather        commited suicide    Medications:       Current Outpatient Medications:  .  busPIRone (BUSPAR) 5 MG tablet, Take 1 tablet (5 mg total) by mouth 2  (two) times daily., Disp: 60 tablet, Rfl: 5 .  clonazePAM (KLONOPIN) 1 MG tablet, One tablet at bedtime for extreme uncontrolled anxiety,  Disp: 20 tablet, Rfl: 0 .  ferrous sulfate 325 (65 FE) MG tablet, Take 1 tablet (325 mg total) by mouth 2 (two) times daily with a meal., Disp: 60 tablet, Rfl: 3 .  FLUoxetine (PROZAC) 20 MG tablet, Take 1 tablet (20 mg total) by mouth daily., Disp: 30 tablet, Rfl: 3 .  lansoprazole (PREVACID) 30 MG capsule, Take 30 mg by mouth daily at 12 noon., Disp: , Rfl:  .  megestrol (MEGACE) 40 MG tablet, Take 1 tablet (40 mg total) by mouth 3 (three) times daily for 10 days. Take 3 times daily for 10 days, Disp: 30 tablet, Rfl: 0 .  rosuvastatin (CRESTOR) 10 MG tablet, Take 1 tablet (10 mg total) by mouth daily., Disp: 30 tablet, Rfl: 5 .  metroNIDAZOLE (FLAGYL) 500 MG tablet, Take 1 tablet (500 mg total) by mouth 2 (two) times daily. (Patient not taking: Reported on 05/19/2018), Disp: 14 tablet, Rfl: 3  Current Facility-Administered Medications:  .  miconazole (MONISTAT 7) 2 % vaginal cream 1 Applicatorful, 1 Applicatorful, Vaginal, QHS, Glo Herring, John V, MD .  triamcinolone acetonide (KENALOG) 10 MG/ML injection 10 mg, 10 mg, Other, Once, Stover, [place], DPM  Objective Blood pressure 134/78, pulse (!) 112, height 5' (1.524 m), weight 125 lb (56.7 kg), last menstrual period 04/20/2018.  General WDWN female NAD Vulva:  normal appearing vulva with no masses, tenderness or lesions Vagina:  normal mucosa, no discharge Cervix:  no lesions and +pain with cervicl displacement Uterus:  normal size, contour, position, consistency, mobility, non-tender no descent Adnexa: ovaries:present,  normal adnexa in size, nontender and no masses   Pertinent ROS No burning with urination, frequency or urgency No nausea, vomiting or diarrhea Nor fever chills or other constitutional symptoms   Labs or studies Hemoglobin 8.8    Impression Diagnoses this Encounter::   ICD-10-CM    1. Menorrhagia with irregular cycle N92.1 POCT hemoglobin  2. Anemia, unspecified type D64.9   3. Dyspareunia, female N94.10     Established relevant diagnosis(es):   Plan/Recommendations: No orders of the defined types were placed in this encounter.   Labs or Scans Ordered: Orders Placed This Encounter  Procedures  . POCT hemoglobin    Management:: >plan Total abdominal hysterectomy 06/04/2018 with preservation of ovaries through a minilap incision  Follow up Return in about 24 days (around 06/12/2018) for [place], with Dr Elonda Husky.        All questions were answered.

## 2018-05-28 DIAGNOSIS — Z029 Encounter for administrative examinations, unspecified: Secondary | ICD-10-CM

## 2018-05-29 ENCOUNTER — Other Ambulatory Visit: Payer: Self-pay | Admitting: Obstetrics & Gynecology

## 2018-05-30 ENCOUNTER — Encounter (HOSPITAL_COMMUNITY)
Admission: RE | Admit: 2018-05-30 | Discharge: 2018-05-30 | Disposition: A | Discharge status: Home / Self Care | Payer: Commercial Managed Care - PPO | Source: Ambulatory Visit | Attending: Obstetrics & Gynecology | Admitting: Obstetrics & Gynecology

## 2018-05-30 ENCOUNTER — Encounter (HOSPITAL_COMMUNITY): Payer: Self-pay

## 2018-05-30 DIAGNOSIS — Z01812 Encounter for preprocedural laboratory examination: Secondary | ICD-10-CM | POA: Insufficient documentation

## 2018-05-30 LAB — COMPREHENSIVE METABOLIC PANEL
ALT: 11 U/L (ref 0–44)
AST: 16 U/L (ref 15–41)
Albumin: 3.5 g/dL (ref 3.5–5.0)
Alkaline Phosphatase: 49 U/L (ref 38–126)
Anion gap: 7 (ref 5–15)
BUN: 7 mg/dL (ref 6–20)
CO2: 23 mmol/L (ref 22–32)
Calcium: 8.8 mg/dL — ABNORMAL LOW (ref 8.9–10.3)
Chloride: 108 mmol/L (ref 98–111)
Creatinine, Ser: 0.66 mg/dL (ref 0.44–1.00)
GFR calc Af Amer: >60 mL/min (ref >60)
GFR calc non Af Amer: >60 mL/min (ref >60)
Glucose, Bld: 94 mg/dL (ref 70–99)
Potassium: 3.3 mmol/L — ABNORMAL LOW (ref 3.5–5.1)
Sodium: 138 mmol/L (ref 135–145)
Total Bilirubin: 0.3 mg/dL (ref 0.3–1.2)
Total Protein: 6.5 g/dL (ref 6.5–8.1)

## 2018-05-30 LAB — URINALYSIS, ROUTINE W REFLEX MICROSCOPIC
Bilirubin Urine: NEGATIVE
Glucose, UA: NEGATIVE mg/dL
Ketones, ur: NEGATIVE mg/dL
Leukocytes, UA: NEGATIVE
Nitrite: NEGATIVE
Protein, ur: NEGATIVE mg/dL
Specific Gravity, Urine: 1.013 (ref 1.005–1.030)
pH: 6 (ref 5.0–8.0)

## 2018-05-30 LAB — RAPID HIV SCREEN (HIV 1/2 AB+AG)
HIV 1/2 Antibodies: NONREACTIVE
HIV-1 P24 Antigen - HIV24: NONREACTIVE

## 2018-05-30 LAB — CBC
HCT: 32.1 % — ABNORMAL LOW (ref 36.0–46.0)
Hemoglobin: 10 g/dL — ABNORMAL LOW (ref 12.0–15.0)
MCH: 28.1 pg (ref 26.0–34.0)
MCHC: 31.2 g/dL (ref 30.0–36.0)
MCV: 90.2 fL (ref 78.0–100.0)
Platelets: 572 10*3/uL — ABNORMAL HIGH (ref 150–400)
RBC: 3.56 MIL/uL — ABNORMAL LOW (ref 3.87–5.11)
RDW: 17.8 % — ABNORMAL HIGH (ref 11.5–15.5)
WBC: 5.2 10*3/uL (ref 4.0–10.5)

## 2018-05-30 LAB — TYPE AND SCREEN
ABO/RH(D): O POS
Antibody Screen: NEGATIVE

## 2018-05-30 LAB — HCG, QUANTITATIVE, PREGNANCY: hCG, Beta Chain, Quant, S: <1 m[IU]/mL (ref <5)

## 2018-05-30 NOTE — Patient Instructions (Signed)
Your procedure is scheduled on: 06/04/2018  Report to [facility] at    [place]  AM.  Call this number if you have problems the morning of surgery: [phone]   Remember:   Do not drink or eat food:After Midnight.  :  Take these medicines the morning of surgery with A SIP OF WATER: prozac, klonopin   Do not wear jewelry, make-up or nail polish.  Do not wear lotions, powders, or perfumes. You may wear deodorant.  Do not shave 48 hours prior to surgery. Men may shave face and neck.  Do not bring valuables to the hospital.  Contacts, dentures or bridgework may not be worn into surgery.  Leave suitcase in the car. After surgery it may be brought to your room.  For patients admitted to the hospital, checkout time is 11:00 AM the day of discharge.   Patients discharged the day of surgery will not be allowed to drive home.    Special Instructions: Shower using CHG night before surgery and shower the day of surgery use CHG.  Use special wash - you have one bottle of CHG for all showers.  You should use approximately 1/2 of the bottle for each shower.  Hysterectomy Information A hysterectomy is a surgery to remove your uterus. After surgery, you will no longer have periods. Also, you will not be able to get pregnant. Reasons for this surgery  You have bleeding that is not normal and keeps coming back.  You have lasting (chronic) lower belly (pelvic) pain.  You have a lasting infection.  The lining of your uterus grows outside your uterus.  The lining of your uterus grows in the muscle of your uterus.  Your uterus falls down into your vagina.  You have a growth in your uterus that causes problems.  You have cells that could turn into cancer (precancerous cells).  You have cancer of the uterus or cervix. Types There are 3 types of hysterectomies. Depending on the type, the surgery will:  Remove the top part of the uterus only.  Remove the uterus and the cervix.  Remove the  uterus, cervix, and tissue that holds the uterus in place in the lower belly.  Ways a hysterectomy can be performed There are 5 ways this surgery can be performed.  A cut (incision) is made in the belly (abdomen). The uterus is taken out through the cut.  A cut is made in the vagina. The uterus is taken out through the cut.  Three or four cuts are made in the belly. A surgical device with a camera is put through one of the cuts. The uterus is cut into small pieces. The uterus is taken out through the cuts or the vagina.  Three or four cuts are made in the belly. A surgical device with a camera is put through one of the cuts. The uterus is taken out through the vagina.  Three or four cuts are made in the belly. A surgical device that is controlled by a computer makes a visual image. The device helps the surgeon control the surgical tools. The uterus is cut into small pieces. The pieces are taken out through the cuts or through the vagina.  What can I expect after the surgery?  You will be given pain medicine.  You will need help at home for 3-5 days after surgery.  You will need to see your doctor in 2-4 weeks after surgery.  You may get hot flashes, have night sweats,  and have trouble sleeping.  You may need to have Pap tests in the future if your surgery was related to cancer. Talk to your doctor. It is still good to have regular exams. This information is not intended to replace advice given to you by your health care provider. Make sure you discuss any questions you have with your health care provider. Document Released: 11/05/2011 Document Revised: 01/19/2016 Document Reviewed: 04/20/2013 Elsevier Interactive Patient Education  2018 [place] Anesthesia, Adult, Care After These instructions provide you with information about caring for yourself after your procedure. Your health care provider may also give you more specific instructions. Your treatment has been planned  according to current medical practices, but problems sometimes occur. Call your health care provider if you have any problems or questions after your procedure. What can I expect after the procedure? After the procedure, it is common to have:  Vomiting.  A sore throat.  Mental slowness.  It is common to feel:  Nauseous.  Cold or shivery.  Sleepy.  Tired.  Sore or achy, even in parts of your body where you did not have surgery.  Follow these instructions at home: For at least 24 hours after the procedure:  Do not: ? Participate in activities where you could fall or become injured. ? Drive. ? Use heavy machinery. ? Drink alcohol. ? Take sleeping pills or medicines that cause drowsiness. ? Make important decisions or sign legal documents. ? Take care of children on your own.  Rest. Eating and drinking  If you vomit, drink water, juice, or soup when you can drink without vomiting.  Drink enough fluid to keep your urine clear or pale yellow.  Make sure you have little or no nausea before eating solid foods.  Follow the diet recommended by your health care provider. General instructions  Have a responsible adult stay with you until you are awake and alert.  Return to your normal activities as told by your health care provider. Ask your health care provider what activities are safe for you.  Take over-the-counter and prescription medicines only as told by your health care provider.  If you smoke, do not smoke without supervision.  Keep all follow-up visits as told by your health care provider. This is important. Contact a health care provider if:  You continue to have nausea or vomiting at home, and medicines are not helpful.  You cannot drink fluids or start eating again.  You cannot urinate after 8-12 hours.  You develop a skin rash.  You have fever.  You have increasing redness at the site of your procedure. Get help right away if:  You have  difficulty breathing.  You have chest pain.  You have unexpected bleeding.  You feel that you are having a life-threatening or urgent problem. This information is not intended to replace advice given to you by your health care provider. Make sure you discuss any questions you have with your health care provider. Document Released: 11/19/2000 Document Revised: 01/16/2016 Document Reviewed: 07/28/2015 Elsevier Interactive Patient Education  [employer].

## 2018-06-02 ENCOUNTER — Telehealth: Payer: Self-pay | Admitting: Obstetrics & Gynecology

## 2018-06-02 NOTE — Telephone Encounter (Signed)
Patient called stating that she would like a call back from [place], [place] states that she has a question regarding her FMLA paperwork. Pt states that it is very important. Please contact pt

## 2018-06-02 NOTE — Telephone Encounter (Signed)
Patient states she needs length of time for intermittent leave added to her forms.  Will fix and refax.

## 2018-06-03 NOTE — H&P (Signed)
Preoperative History and Physical  [name] [name] is a 47 y.o. G2P0020 with No LMP recorded. admitted for a TAH.  Pt is in as a follow up from the ED S/P ablation 2015 with continued menometrorrhagia, also has bump dyspareunia, 100%, simulated on exam S/p depo provera 300 mg + megestrol 3 per day and still bleeding with anemia, hemoglobin increased from 8.3 to 8.8 today  Since she is s/p ablation will proceed with hysterectomy, bump dyspareunia need cervix removed, wants to preserve ovaries, patient decision  PMH:    Past Medical History:  Diagnosis Date  . Abnormal vaginal Pap smear    Dr Glo Herring  . Allergic rhinitis   . BV (bacterial vaginosis) 02/09/2015  . Fibroids 10/28/2013  . Hyperlipidemia    x4 years   . Hypertension    whenshe wasa young but she changed diet and reducd salt intake   . Irregular intermenstrual bleeding 10/28/2013  . Kidney stones   . Mental disorder   . Unspecified symptom associated with female genital organs 10/28/2013  . Vaginal discharge 02/09/2015  . Vaginal odor 02/09/2015  . Vaginal Pap smear, abnormal     PSH:     Past Surgical History:  Procedure Laterality Date  . BREAST REDUCTION SURGERY  Jan 2007  . CERVICAL BIOPSY  2010   for abnormal pap   . cyst removed from both breast , all benign both breast    . DILITATION & CURRETTAGE/HYSTROSCOPY WITH THERMACHOICE ABLATION N/A 12/15/2013   Procedure: DILATATION & CURETTAGE/HYSTEROSCOPY WITH THERMACHOICE ABLATION;  Surgeon: Jonnie Kind, MD;  Location: [facility];  Service: Gynecology;  Laterality: N/A;  total therapy time:12min 1sec D5W  70ml in, D5W  64ml out Temperature 87 degree c.  . EXCISION OF SKIN TAG Right 12/15/2013   Procedure: EXCISION OF SKIN TAG;  Surgeon: Jonnie Kind, MD;  Location: [facility];  Service: Gynecology;  Laterality: Right;  . LAPAROSCOPIC BILATERAL SALPINGECTOMY Bilateral 12/15/2013   Procedure: LAPAROSCOPIC BILATERAL SALPINGECTOMY;  Surgeon: Jonnie Kind, MD;  Location:  [facility];  Service: Gynecology;  Laterality: Bilateral;  . REMOVAL OF NON VAGINAL CONTRACEPTIVE DEVICE Left 12/15/2013   Procedure: REMOVAL OF NON VAGINAL CONTRACEPTIVE [place];  Surgeon: Jonnie Kind, MD;  Location: [facility];  Service: Gynecology;  Laterality: Left;    POb/GynH:      OB History    Gravida  2   Para  0   Term  0   Preterm      AB  2   Living        SAB      TAB  2   Ectopic      Multiple      Live Births              SH:   Social History   Tobacco Use  . Smoking status: Former Smoker    Packs/day: 0.20    Years: 20.00    Pack years: 4.00    Types: Cigarettes    Last attempt to quit: 06/05/2009    Years since quitting: 9.0  . Smokeless tobacco: Never Used  Substance Use Topics  . Alcohol use: Yes    Comment: occasionally, couple drinks per mo  . Drug use: No    FH:    Family History  Problem Relation Age of Onset  . Fibromyalgia Mother   . Hypertension Mother   . Thyroid disease Mother   . Arthritis Mother   . Alcohol abuse Father   .  COPD Father   . Bipolar disorder Father   . Bipolar disorder Sister   . Anxiety disorder Sister        has panic attacks  . Bipolar disorder Brother   . Alcohol abuse Brother   . Alcohol abuse Brother   . Alcohol abuse Brother   . Other Brother        handicap  . Alcohol abuse Brother   . Diabetes Maternal Grandfather   . Other Paternal Grandfather        commited suicide     Allergies: No Known Allergies  Medications:       Current Facility-Administered Medications:  .  miconazole (MONISTAT 7) 2 % vaginal cream 1 Applicatorful, 1 Applicatorful, Vaginal, QHS, Glo Herring, John V, MD .  triamcinolone acetonide (KENALOG) 10 MG/ML injection 10 mg, 10 mg, Other, Once, [name], DPM  Current Outpatient Medications:  .  aspirin-acetaminophen-caffeine (EXCEDRIN MIGRAINE) 250-250-65 MG tablet, Take 1 tablet by mouth daily as needed for headache., Disp: , Rfl:  .  busPIRone (BUSPAR)  5 MG tablet, Take 1 tablet (5 mg total) by mouth 2 (two) times daily., Disp: 60 tablet, Rfl: 5 .  clonazePAM (KLONOPIN) 1 MG tablet, One tablet at bedtime for extreme uncontrolled anxiety (Patient taking differently: Take 1 mg by mouth daily as needed for anxiety. ), Disp: 20 tablet, Rfl: 0 .  ferrous sulfate 325 (65 FE) MG tablet, Take 1 tablet (325 mg total) by mouth 2 (two) times daily with a meal., Disp: 60 tablet, Rfl: 3 .  FLUoxetine (PROZAC) 20 MG tablet, Take 1 tablet (20 mg total) by mouth daily., Disp: 30 tablet, Rfl: 3 .  ibuprofen (ADVIL,MOTRIN) 200 MG tablet, Take 400 mg by mouth daily as needed for headache or moderate pain., Disp: , Rfl:  .  lansoprazole (PREVACID) 30 MG capsule, Take 30 mg by mouth daily. , Disp: , Rfl:  .  megestrol (MEGACE) 40 MG tablet, Take 1 tablet (40 mg total) by mouth 3 (three) times daily. (Patient taking differently: Take 120 mg by mouth daily. ), Disp: 90 tablet, Rfl: 0 .  rosuvastatin (CRESTOR) 10 MG tablet, Take 1 tablet (10 mg total) by mouth daily., Disp: 30 tablet, Rfl: 5 .  metroNIDAZOLE (FLAGYL) 500 MG tablet, Take 1 tablet (500 mg total) by mouth 2 (two) times daily. (Patient not taking: Reported on 05/19/2018), Disp: 14 tablet, Rfl: 3  Review of Systems:   Review of Systems  Constitutional: Negative for fever, chills, weight loss, malaise/fatigue and diaphoresis.  HENT: Negative for hearing loss, ear pain, nosebleeds, congestion, sore throat, neck pain, tinnitus and ear discharge.   Eyes: Negative for blurred vision, double vision, photophobia, pain, discharge and redness.  Respiratory: Negative for cough, hemoptysis, sputum production, shortness of breath, wheezing and stridor.   Cardiovascular: Negative for chest pain, palpitations, orthopnea, claudication, leg swelling and PND.  Gastrointestinal: Positive for abdominal pain. Negative for heartburn, nausea, vomiting, diarrhea, constipation, blood in stool and melena.  Genitourinary: Negative  for dysuria, urgency, frequency, hematuria and flank pain.  Musculoskeletal: Negative for myalgias, back pain, joint pain and falls.  Skin: Negative for itching and rash.  Neurological: Negative for dizziness, tingling, tremors, sensory change, speech change, focal weakness, seizures, loss of consciousness, weakness and headaches.  Endo/Heme/Allergies: Negative for environmental allergies and polydipsia. Does not bruise/bleed easily.  Psychiatric/Behavioral: Negative for depression, suicidal ideas, hallucinations, memory loss and substance abuse. The patient is not nervous/anxious and does not have insomnia.      PHYSICAL  EXAM:  There were no vitals taken for this visit.    Vitals reviewed. Constitutional: She is oriented to person, place, and time. She appears well-developed and well-nourished.  HENT:  Head: Normocephalic and atraumatic.  Right Ear: External ear normal.  Left Ear: External ear normal.  Nose: Nose normal.  Mouth/Throat: Oropharynx is clear and moist.  Eyes: Conjunctivae and EOM are normal. Pupils are equal, round, and reactive to light. Right eye exhibits no discharge. Left eye exhibits no discharge. No scleral icterus.  Neck: Normal range of motion. Neck supple. No tracheal deviation present. No thyromegaly present.  Cardiovascular: Normal rate, regular rhythm, normal heart sounds and intact distal pulses.  Exam reveals no gallop and no friction rub.   No murmur heard. Respiratory: Effort normal and breath sounds normal. No respiratory distress. She has no wheezes. She has no rales. She exhibits no tenderness.  GI: Soft. Bowel sounds are normal. She exhibits no distension and no mass. There is tenderness. There is no rebound and no guarding.  Genitourinary:       Vulva is normal without lesions Vagina is pink moist without discharge Cervix normal in appearance and pap is normal Uterus is normal size, contour, position, consistency, mobility, non-tender Adnexa is  negative with normal sized ovaries by sonogram  Musculoskeletal: Normal range of motion. She exhibits no edema and no tenderness.  Neurological: She is alert and oriented to person, place, and time. She has normal reflexes. She displays normal reflexes. No cranial nerve deficit. She exhibits normal muscle tone. Coordination normal.  Skin: Skin is warm and dry. No rash noted. No erythema. No pallor.  Psychiatric: She has a normal mood and affect. Her behavior is normal. Judgment and thought content normal.    Labs: Results for orders placed or performed during the hospital encounter of 05/30/18 (from the past 336 hour(s))  CBC   Collection Time: 05/30/18  2:22 PM  Result Value Ref Range   WBC 5.2 4.0 - 10.5 K/uL   RBC 3.56 (L) 3.87 - 5.11 MIL/uL   Hemoglobin 10.0 (L) 12.0 - 15.0 g/dL   HCT 32.1 (L) 36.0 - 46.0 %   MCV 90.2 78.0 - 100.0 fL   MCH 28.1 26.0 - 34.0 pg   MCHC 31.2 30.0 - 36.0 g/dL   RDW 17.8 (H) 11.5 - 15.5 %   Platelets 572 (H) 150 - 400 K/uL  Comprehensive metabolic panel   Collection Time: 05/30/18  2:22 PM  Result Value Ref Range   Sodium 138 135 - 145 mmol/L   Potassium 3.3 (L) 3.5 - 5.1 mmol/L   Chloride 108 98 - 111 mmol/L   CO2 23 22 - 32 mmol/L   Glucose, Bld 94 70 - 99 mg/dL   BUN 7 6 - 20 mg/dL   Creatinine, Ser 0.66 0.44 - 1.00 mg/dL   Calcium 8.8 (L) 8.9 - 10.3 mg/dL   Total Protein 6.5 6.5 - 8.1 g/dL   Albumin 3.5 3.5 - 5.0 g/dL   AST 16 15 - 41 U/L   ALT 11 0 - 44 U/L   Alkaline Phosphatase 49 38 - 126 U/L   Total Bilirubin 0.3 0.3 - 1.2 mg/dL   GFR calc non Af Amer >60 >60 mL/min   GFR calc Af Amer >60 >60 mL/min   Anion gap 7 5 - 15  hCG, quantitative, pregnancy   Collection Time: 05/30/18  2:22 PM  Result Value Ref Range   hCG, Beta Chain, Quant, S <1 <5 mIU/mL  Rapid HIV screen (HIV 1/2 Ab+Ag)   Collection Time: 05/30/18  2:22 PM  Result Value Ref Range   HIV-1 P24 Antigen - HIV24 NON REACTIVE NON REACTIVE   HIV 1/2 Antibodies NON  REACTIVE NON REACTIVE   Interpretation (HIV Ag Ab)      A non reactive test result means that HIV 1 or HIV 2 antibodies and HIV 1 p24 antigen were not detected in the specimen.  Urinalysis, Routine w reflex microscopic   Collection Time: 05/30/18  2:22 PM  Result Value Ref Range   Color, Urine YELLOW YELLOW   APPearance HAZY (A) CLEAR   Specific Gravity, Urine 1.013 1.005 - 1.030   pH 6.0 5.0 - 8.0   Glucose, UA NEGATIVE NEGATIVE mg/dL   Hgb urine dipstick MODERATE (A) NEGATIVE   Bilirubin Urine NEGATIVE NEGATIVE   Ketones, ur NEGATIVE NEGATIVE mg/dL   Protein, ur NEGATIVE NEGATIVE mg/dL   Nitrite NEGATIVE NEGATIVE   Leukocytes, UA NEGATIVE NEGATIVE   RBC / HPF 0-5 0 - 5 RBC/hpf   WBC, UA 0-5 0 - 5 WBC/hpf   Bacteria, UA RARE (A) NONE SEEN   Squamous Epithelial / LPF 6-10 0 - 5   Mucus PRESENT   Type and screen   Collection Time: 05/30/18  2:22 PM  Result Value Ref Range   ABO/RH(D) O POS    Antibody Screen NEG    Sample Expiration 06/12/2018    Extend sample reason      NO TRANSFUSIONS OR PREGNANCY IN THE PAST 3 MONTHS Performed at [facility], [address], [place], [place] [address]     EKG: No orders found for this or any previous visit.  Imaging Studies: No results found.    Assessment: Menometrorrhagia Dysmenorrhea Bump dyspareunia Anemia S/P ablation 2015  Patient Active Problem List   Diagnosis Date Noted  . Menorrhagia with irregular cycle 05/12/2018  . Abnormal uterine bleeding (AUB) 04/01/2017  . Generalized anxiety disorder 11/23/2013  . Fibroids, submucosal 10/28/2013  . Depression, recurrent ([place]) 07/31/2013  . Insomnia 07/31/2013  . Vitamin D deficiency 12/24/2010  . Hyperlipidemia LDL goal <100 11/30/2009  . Allergic rhinitis 07/27/2009  . GERD 07/27/2009    Plan: Total abdominal hysterectomy, removal of tubes  Pt understands the risks of surgery including but not limited t  excessive bleeding requiring transfusion or reoperation,  post-operative infection requiring prolonged hospitalization or re-hospitalization and antibiotic therapy, and damage to other organs including bladder, bowel, ureters and major vessels.  The patient also understands the alternative treatment options which were discussed in full.  All questions were answered.    Florian Buff 06/03/2018 4:29 PM

## 2018-06-04 ENCOUNTER — Inpatient Hospital Stay (HOSPITAL_COMMUNITY): Payer: Commercial Managed Care - PPO | Admitting: Anesthesiology

## 2018-06-04 ENCOUNTER — Other Ambulatory Visit: Payer: Self-pay

## 2018-06-04 ENCOUNTER — Inpatient Hospital Stay (HOSPITAL_COMMUNITY)
Admission: RE | Admit: 2018-06-04 | Discharge: 2018-06-05 | DRG: 743 | Disposition: A | Discharge status: Home / Self Care | Payer: Commercial Managed Care - PPO | Source: Ambulatory Visit | Attending: Obstetrics & Gynecology | Admitting: Obstetrics & Gynecology

## 2018-06-04 ENCOUNTER — Encounter (HOSPITAL_COMMUNITY): Payer: Self-pay | Admitting: *Deleted

## 2018-06-04 ENCOUNTER — Encounter (HOSPITAL_COMMUNITY)
Admission: RE | Disposition: A | Discharge status: Home / Self Care | Payer: Self-pay | Source: Ambulatory Visit | Attending: Obstetrics & Gynecology

## 2018-06-04 DIAGNOSIS — N921 Excessive and frequent menstruation with irregular cycle: Principal | ICD-10-CM | POA: Diagnosis present

## 2018-06-04 DIAGNOSIS — Z90710 Acquired absence of both cervix and uterus: Secondary | ICD-10-CM | POA: Diagnosis present

## 2018-06-04 DIAGNOSIS — Z825 Family history of asthma and other chronic lower respiratory diseases: Secondary | ICD-10-CM | POA: Diagnosis not present

## 2018-06-04 DIAGNOSIS — Z87891 Personal history of nicotine dependence: Secondary | ICD-10-CM | POA: Diagnosis not present

## 2018-06-04 DIAGNOSIS — N9419 Other specified dyspareunia: Secondary | ICD-10-CM | POA: Diagnosis present

## 2018-06-04 DIAGNOSIS — D500 Iron deficiency anemia secondary to blood loss (chronic): Secondary | ICD-10-CM | POA: Diagnosis present

## 2018-06-04 DIAGNOSIS — Z8261 Family history of arthritis: Secondary | ICD-10-CM | POA: Diagnosis not present

## 2018-06-04 DIAGNOSIS — Z8249 Family history of ischemic heart disease and other diseases of the circulatory system: Secondary | ICD-10-CM

## 2018-06-04 DIAGNOSIS — N736 Female pelvic peritoneal adhesions (postinfective): Secondary | ICD-10-CM | POA: Diagnosis present

## 2018-06-04 DIAGNOSIS — N946 Dysmenorrhea, unspecified: Secondary | ICD-10-CM | POA: Diagnosis present

## 2018-06-04 DIAGNOSIS — D649 Anemia, unspecified: Secondary | ICD-10-CM | POA: Diagnosis not present

## 2018-06-04 HISTORY — PX: ABDOMINAL HYSTERECTOMY: SHX81

## 2018-06-04 SURGERY — HYSTERECTOMY, ABDOMINAL
Anesthesia: General

## 2018-06-04 MED ORDER — OXYCODONE-ACETAMINOPHEN 5-325 MG PO TABS
1.0000 | ORAL_TABLET | ORAL | Status: DC | PRN
  Administered 2018-06-04: 1 via ORAL
  Filled 2018-06-04: qty 1

## 2018-06-04 MED ORDER — ENOXAPARIN SODIUM 40 MG/0.4ML ~~LOC~~ SOLN
40.0000 mg | SUBCUTANEOUS | Status: DC
  Administered 2018-06-05: 40 mg via SUBCUTANEOUS
  Filled 2018-06-04: qty 0.4

## 2018-06-04 MED ORDER — SODIUM CHLORIDE 0.9 % IV SOLN
510.0000 mg | INTRAVENOUS | Status: DC
  Administered 2018-06-04: 510 mg via INTRAVENOUS
  Filled 2018-06-04: qty 17

## 2018-06-04 MED ORDER — BUPIVACAINE LIPOSOME 1.3 % IJ SUSP
20.0000 mL | Freq: Once | INTRAMUSCULAR | Status: DC
  Filled 2018-06-04: qty 20

## 2018-06-04 MED ORDER — SUGAMMADEX SODIUM 200 MG/2ML IV SOLN
INTRAVENOUS | Status: DC | PRN
  Administered 2018-06-04: 200 mg via INTRAVENOUS

## 2018-06-04 MED ORDER — PROPOFOL 10 MG/ML IV BOLUS
INTRAVENOUS | Status: DC | PRN
  Administered 2018-06-04: 200 mg via INTRAVENOUS

## 2018-06-04 MED ORDER — KCL IN DEXTROSE-NACL 20-5-0.45 MEQ/L-%-% IV SOLN
INTRAVENOUS | Status: AC
  Administered 2018-06-04: 14:00:00 via INTRAVENOUS

## 2018-06-04 MED ORDER — SUCCINYLCHOLINE CHLORIDE 20 MG/ML IJ SOLN
INTRAMUSCULAR | Status: AC
  Filled 2018-06-04: qty 1

## 2018-06-04 MED ORDER — DEXAMETHASONE SODIUM PHOSPHATE 4 MG/ML IJ SOLN
INTRAMUSCULAR | Status: AC
  Filled 2018-06-04: qty 1

## 2018-06-04 MED ORDER — BUSPIRONE HCL 5 MG PO TABS
5.0000 mg | ORAL_TABLET | Freq: Two times a day (BID) | ORAL | Status: DC
  Administered 2018-06-04 – 2018-06-05 (×2): 5 mg via ORAL
  Filled 2018-06-04 (×2): qty 1

## 2018-06-04 MED ORDER — ROCURONIUM BROMIDE 50 MG/5ML IV SOLN
INTRAVENOUS | Status: AC
  Filled 2018-06-04: qty 1

## 2018-06-04 MED ORDER — KETOROLAC TROMETHAMINE 30 MG/ML IJ SOLN
INTRAMUSCULAR | Status: AC
  Filled 2018-06-04: qty 1

## 2018-06-04 MED ORDER — DIPHENHYDRAMINE HCL 50 MG/ML IJ SOLN: 25.0000 mg | Freq: Four times a day (QID) | INTRAMUSCULAR | Status: DC | PRN

## 2018-06-04 MED ORDER — FLUOXETINE HCL 20 MG PO CAPS
20.0000 mg | ORAL_CAPSULE | Freq: Every day | ORAL | Status: DC
  Administered 2018-06-04 – 2018-06-05 (×2): 20 mg via ORAL
  Filled 2018-06-04 (×5): qty 1

## 2018-06-04 MED ORDER — KETOROLAC TROMETHAMINE 30 MG/ML IJ SOLN
30.0000 mg | Freq: Once | INTRAMUSCULAR | Status: AC
  Administered 2018-06-04: 30 mg via INTRAVENOUS

## 2018-06-04 MED ORDER — LACTATED RINGERS IV SOLN
INTRAVENOUS | Status: DC
  Administered 2018-06-04: 1000 mL via INTRAVENOUS
  Administered 2018-06-04 (×2): via INTRAVENOUS

## 2018-06-04 MED ORDER — BUPIVACAINE LIPOSOME 1.3 % IJ SUSP
INTRAMUSCULAR | Status: AC
  Filled 2018-06-04: qty 20

## 2018-06-04 MED ORDER — ALUM & MAG HYDROXIDE-SIMETH 200-200-20 MG/5ML PO SUSP: 30.0000 mL | ORAL | Status: DC | PRN

## 2018-06-04 MED ORDER — ONDANSETRON HCL 4 MG/2ML IJ SOLN
INTRAMUSCULAR | Status: DC | PRN
  Administered 2018-06-04: 4 mg via INTRAVENOUS

## 2018-06-04 MED ORDER — PROMETHAZINE HCL 25 MG/ML IJ SOLN
25.0000 mg | Freq: Four times a day (QID) | INTRAMUSCULAR | Status: DC | PRN
  Administered 2018-06-04: 25 mg via INTRAVENOUS
  Filled 2018-06-04: qty 1

## 2018-06-04 MED ORDER — BISACODYL 10 MG RE SUPP: 10.0000 mg | Freq: Every day | RECTAL | Status: DC | PRN

## 2018-06-04 MED ORDER — HYDROMORPHONE HCL 1 MG/ML IJ SOLN
0.5000 mg | INTRAMUSCULAR | Status: DC | PRN
  Administered 2018-06-04: 0.5 mg via INTRAVENOUS
  Filled 2018-06-04: qty 0.5

## 2018-06-04 MED ORDER — FENTANYL CITRATE (PF) 250 MCG/5ML IJ SOLN
INTRAMUSCULAR | Status: AC
  Filled 2018-06-04: qty 5

## 2018-06-04 MED ORDER — HYDROCODONE-ACETAMINOPHEN 7.5-325 MG PO TABS: 1.0000 | ORAL_TABLET | Freq: Once | ORAL | Status: DC | PRN

## 2018-06-04 MED ORDER — FENTANYL CITRATE (PF) 100 MCG/2ML IJ SOLN
INTRAMUSCULAR | Status: AC
  Filled 2018-06-04: qty 2

## 2018-06-04 MED ORDER — KETOROLAC TROMETHAMINE 30 MG/ML IJ SOLN
30.0000 mg | Freq: Four times a day (QID) | INTRAMUSCULAR | Status: AC
  Administered 2018-06-04 – 2018-06-05 (×3): 30 mg via INTRAVENOUS
  Filled 2018-06-04 (×3): qty 1

## 2018-06-04 MED ORDER — CEFAZOLIN SODIUM-DEXTROSE 2-4 GM/100ML-% IV SOLN
INTRAVENOUS | Status: AC
  Filled 2018-06-04: qty 100

## 2018-06-04 MED ORDER — MIDAZOLAM HCL 2 MG/2ML IJ SOLN: 0.5000 mg | Freq: Once | INTRAMUSCULAR | Status: DC | PRN

## 2018-06-04 MED ORDER — MIDAZOLAM HCL 2 MG/2ML IJ SOLN
INTRAMUSCULAR | Status: AC
  Filled 2018-06-04: qty 2

## 2018-06-04 MED ORDER — CEFAZOLIN SODIUM-DEXTROSE 2-4 GM/100ML-% IV SOLN
2.0000 g | INTRAVENOUS | Status: AC
  Administered 2018-06-04: 2 g via INTRAVENOUS

## 2018-06-04 MED ORDER — HEMOSTATIC AGENTS (NO CHARGE) OPTIME
TOPICAL | Status: DC | PRN
  Administered 2018-06-04: 1 via TOPICAL

## 2018-06-04 MED ORDER — SUGAMMADEX SODIUM 200 MG/2ML IV SOLN
INTRAVENOUS | Status: AC
  Filled 2018-06-04: qty 2

## 2018-06-04 MED ORDER — SODIUM CHLORIDE 0.9 % IR SOLN
Status: DC | PRN
  Administered 2018-06-04 (×3): 1000 mL

## 2018-06-04 MED ORDER — LIDOCAINE HCL (CARDIAC) PF 50 MG/5ML IV SOSY
PREFILLED_SYRINGE | INTRAVENOUS | Status: DC | PRN
  Administered 2018-06-04: 30 mg via INTRAVENOUS

## 2018-06-04 MED ORDER — ROSUVASTATIN CALCIUM 10 MG PO TABS
10.0000 mg | ORAL_TABLET | Freq: Every day | ORAL | Status: DC
  Administered 2018-06-04 – 2018-06-05 (×2): 10 mg via ORAL
  Filled 2018-06-04 (×2): qty 1

## 2018-06-04 MED ORDER — SENNOSIDES-DOCUSATE SODIUM 8.6-50 MG PO TABS: 1.0000 | ORAL_TABLET | Freq: Every evening | ORAL | Status: DC | PRN

## 2018-06-04 MED ORDER — PROMETHAZINE HCL 25 MG/ML IJ SOLN: 6.2500 mg | INTRAMUSCULAR | Status: DC | PRN

## 2018-06-04 MED ORDER — DOCUSATE SODIUM 100 MG PO CAPS
100.0000 mg | ORAL_CAPSULE | Freq: Two times a day (BID) | ORAL | Status: DC
  Administered 2018-06-04 – 2018-06-05 (×2): 100 mg via ORAL
  Filled 2018-06-04 (×2): qty 1

## 2018-06-04 MED ORDER — ROCURONIUM BROMIDE 100 MG/10ML IV SOLN
INTRAVENOUS | Status: DC | PRN
  Administered 2018-06-04: 50 mg via INTRAVENOUS

## 2018-06-04 MED ORDER — FENTANYL CITRATE (PF) 100 MCG/2ML IJ SOLN
INTRAMUSCULAR | Status: DC | PRN
  Administered 2018-06-04 (×2): 100 ug via INTRAVENOUS
  Administered 2018-06-04: 50 ug via INTRAVENOUS
  Administered 2018-06-04: 100 ug via INTRAVENOUS

## 2018-06-04 MED ORDER — PROPOFOL 10 MG/ML IV BOLUS
INTRAVENOUS | Status: AC
  Filled 2018-06-04: qty 40

## 2018-06-04 MED ORDER — HYDROMORPHONE HCL 1 MG/ML IJ SOLN
0.2500 mg | INTRAMUSCULAR | Status: DC | PRN
  Administered 2018-06-04 (×4): 0.5 mg via INTRAVENOUS
  Filled 2018-06-04 (×4): qty 0.5

## 2018-06-04 MED ORDER — LABETALOL HCL 5 MG/ML IV SOLN
INTRAVENOUS | Status: AC
  Filled 2018-06-04: qty 4

## 2018-06-04 MED ORDER — DEXAMETHASONE SODIUM PHOSPHATE 10 MG/ML IJ SOLN
INTRAMUSCULAR | Status: DC | PRN
  Administered 2018-06-04: 4 mg via INTRAVENOUS

## 2018-06-04 MED ORDER — BUPIVACAINE LIPOSOME 1.3 % IJ SUSP
INTRAMUSCULAR | Status: DC | PRN
  Administered 2018-06-04: 20 mL

## 2018-06-04 MED ORDER — ONDANSETRON HCL 4 MG PO TABS
8.0000 mg | ORAL_TABLET | Freq: Four times a day (QID) | ORAL | Status: DC | PRN
  Administered 2018-06-04: 8 mg via ORAL
  Filled 2018-06-04: qty 2

## 2018-06-04 MED ORDER — CLONAZEPAM 0.5 MG PO TABS: 1.0000 mg | ORAL_TABLET | Freq: Every evening | ORAL | Status: DC | PRN

## 2018-06-04 MED ORDER — MIDAZOLAM HCL 5 MG/5ML IJ SOLN
INTRAMUSCULAR | Status: DC | PRN
  Administered 2018-06-04: 2 mg via INTRAVENOUS

## 2018-06-04 MED ORDER — PANTOPRAZOLE SODIUM 40 MG PO TBEC
40.0000 mg | DELAYED_RELEASE_TABLET | Freq: Every day | ORAL | Status: DC
  Administered 2018-06-04: 40 mg via ORAL
  Filled 2018-06-04 (×2): qty 1

## 2018-06-04 MED ORDER — ONDANSETRON HCL 4 MG/2ML IJ SOLN
INTRAMUSCULAR | Status: AC
  Filled 2018-06-04: qty 2

## 2018-06-04 MED ORDER — LABETALOL HCL 5 MG/ML IV SOLN
5.0000 mg | INTRAVENOUS | Status: DC | PRN
  Administered 2018-06-04 (×3): 5 mg via INTRAVENOUS

## 2018-06-04 MED ORDER — LIDOCAINE HCL (PF) 1 % IJ SOLN
INTRAMUSCULAR | Status: AC
  Filled 2018-06-04: qty 5

## 2018-06-04 MED ORDER — SODIUM CHLORIDE 0.9 % IV SOLN
8.0000 mg | Freq: Four times a day (QID) | INTRAVENOUS | Status: DC | PRN
  Filled 2018-06-04: qty 4

## 2018-06-04 SURGICAL SUPPLY — 41 items
ADH SKN CLS APL DERMABOND .7 (GAUZE/BANDAGES/DRESSINGS) ×2
CELLS DAT CNTRL 66122 CELL SVR (MISCELLANEOUS) ×1 IMPLANT
CLOTH BEACON ORANGE TIMEOUT ST (SAFETY) ×3 IMPLANT
COVER LIGHT HANDLE STERIS (MISCELLANEOUS) ×6 IMPLANT
DERMABOND ADVANCED (GAUZE/BANDAGES/DRESSINGS) ×4
DERMABOND ADVANCED .7 DNX12 (GAUZE/BANDAGES/DRESSINGS) IMPLANT
DRAPE WARM FLUID 44X44 (DRAPE) ×3 IMPLANT
DRSG OPSITE POSTOP 4X8 (GAUZE/BANDAGES/DRESSINGS) ×3 IMPLANT
ELECT REM PT RETURN 9FT ADLT (ELECTROSURGICAL) ×3
ELECTRODE REM PT RTRN 9FT ADLT (ELECTROSURGICAL) ×1 IMPLANT
GAUZE SPONGE 4X4 16PLY XRAY LF (GAUZE/BANDAGES/DRESSINGS) ×3 IMPLANT
GLOVE BIOGEL PI IND STRL 7.0 (GLOVE) ×2 IMPLANT
GLOVE BIOGEL PI IND STRL 8 (GLOVE) ×1 IMPLANT
GLOVE BIOGEL PI INDICATOR 7.0 (GLOVE) ×8
GLOVE BIOGEL PI INDICATOR 8 (GLOVE) ×2
GLOVE ECLIPSE 8.0 STRL XLNG CF (GLOVE) ×3 IMPLANT
GOWN STRL REUS W/TWL LRG LVL3 (GOWN DISPOSABLE) ×6 IMPLANT
GOWN STRL REUS W/TWL XL LVL3 (GOWN DISPOSABLE) ×3 IMPLANT
HEMOSTAT ARISTA ABSORB 3G PWDR (MISCELLANEOUS) ×2 IMPLANT
INST SET MAJOR GENERAL (KITS) ×3 IMPLANT
KIT TURNOVER KIT A (KITS) ×3 IMPLANT
MANIFOLD NEPTUNE II (INSTRUMENTS) ×3 IMPLANT
NDL HYPO 21X1.5 SAFETY (NEEDLE) ×1 IMPLANT
NEEDLE HYPO 21X1.5 SAFETY (NEEDLE) ×3 IMPLANT
NS IRRIG 1000ML POUR BTL (IV SOLUTION) ×8 IMPLANT
PACK ABDOMINAL MAJOR (CUSTOM PROCEDURE TRAY) ×3 IMPLANT
PAD ARMBOARD 7.5X6 YLW CONV (MISCELLANEOUS) ×3 IMPLANT
RETRACTOR WND ALEXIS 18 MED (MISCELLANEOUS) IMPLANT
RTRCTR WOUND ALEXIS 18CM MED (MISCELLANEOUS) ×3
SET BASIN LINEN APH (SET/KITS/TRAYS/PACK) ×3 IMPLANT
SUT CHROMIC 0 CT 1 (SUTURE) ×3 IMPLANT
SUT MON AB 3-0 SH 27 (SUTURE) ×4 IMPLANT
SUT PLAIN 2 0 XLH (SUTURE) ×2 IMPLANT
SUT VIC AB 0 CT1 27 (SUTURE) ×6
SUT VIC AB 0 CT1 27XCR 8 STRN (SUTURE) ×2 IMPLANT
SUT VIC AB 0 CTX 36 (SUTURE) ×3
SUT VIC AB 0 CTX36XBRD ANTBCTR (SUTURE) ×1 IMPLANT
SUT VICRYL 3 0 (SUTURE) ×3 IMPLANT
SYR 20CC LL (SYRINGE) ×3 IMPLANT
TOWEL SURG RFD BLUE STRL DISP (DISPOSABLE) ×3 IMPLANT
TRAY FOLEY MTR SLVR 16FR STAT (SET/KITS/TRAYS/PACK) ×3 IMPLANT

## 2018-06-04 NOTE — Op Note (Signed)
Preoperative diagnosis:  1.  Menometrorrhagia                                           2.  Dysmenorrhea                                          3.  Anemia                                          4.  Bump dyspareunia                                         5.  S/P endometrial ablation 2015  Postoperative diagnosis:  Same as above + densely adherent left ovary to posterior uterus and left pelvic sidewall  Procedure:  Abdominal hysterectomy, total, with left oophorectomy  Surgeon:  Florian Buff  Assistant:    Anesthesia:  General endotracheal  Preoperative clinical summary:    Intraoperative findings: uterus with small leiomyomata, adhesions of posterior uterus to cul de sac, left ovary was densely adherent  Description of operation:  Patient was taken to the operating room and placed in the supine position where she underwent general endotracheal anesthesia.  She was then prepped and draped in the usual sterile fashion and a Foley catheter was placed for continuous bladder drainage.  A 8 cm minilap skin incision was made and carried down sharply to the rectus fascia which was scored in the midline and extended laterally.  The fascia was taken off the muscles superiorly and inferiorly without difficulty.  The muscles were divided.  The peritoneal cavity was entered.  A medium Alexis self-retaining retractor was placed.  The upper abdomen was packed away. Both uterine cornu were grasped with Coker clamps.  The left round ligament was suture ligated and coagulated with the electrocautery unit.  The left vesicouterine serosal flap was created.  An avascular window in in the peritoneum was created and the utero-ovarian ligament was cross clamped, cut and suture ligated.  The right round ligament was suture ligated and cut with the electrocautery unit.  The vesicouterine serosal flap on the right was created.  An avascular window in the peritoneum was created and the right utero-ovarian ligament was  cross clamped, cut and double suture ligated.  Thus both ovaries were preserved.  The uterine vessels were skeletonized bilaterally.  The uterine vessels were clamped bilaterally,  then cut and suture ligated.  Two more pedicles were taken down the cervix medial to the uterine vessels.  Each pedicle was clamped cut and suture ligated with good resulting hemostasis.  The vagina was cross clamped and the specimen was removed.  Vaginal angle sutures were placed and the vagina was closed with interrupted figure of eight sutures..  The pelvis was irrigated vigorously and all pedicles were examined and found to be hemostatic. The left ovary was densely adherent posteriorly and laterally and would have contributed to bump dyspareunia so I decided it was in the patient's best interest to remove it.  I clamped acrossed it and sharply removed it and did  a double fore and aft suture technique for hemostasis.  Thus the right ovary was preserved and the left ovary was removed.   All specimens were sent to pathology for routine evaluation.  The Alexis self-retaining retractor was removed and the pelvis was irrigated vigorously.  All packs were removed and all counts were correct at this point x 3.  The muscles and peritoneum were reapproximated loosely.  The fascia was closed with 0 Vicryl running.  .  The skin was closed using 3-0 Vicryl on a Keith needle in a subcuticular fashion.  Dermabond was then applied for additional wound integrity and to serve as a postoperative bacterial barrier.  The patient was awakened from anesthesia taken to the recovery room in good stable condition. All sponge instrument and needle counts were correct x 3. Radio frequency evaluation was also performed to "look" for sponges and the scan was negative.  The patient received 2 grams Ancef and 30 mg Toradol prophylactically preoperatively.    Estimated blood loss for the procedure was 100  cc.  Florian Buff, MD  06/04/2018 11:16 AM

## 2018-06-04 NOTE — Anesthesia Postprocedure Evaluation (Signed)
Anesthesia Post Note Late Entry for 1145  Patient: [name] [name]  Procedure(s) Performed: TOTAL ABDOMINAL HYSTERECTOMY WITH LEFT OOPHORECTOMY (N/A )  Patient location during evaluation: PACU Anesthesia Type: General Level of consciousness: awake and alert and oriented Pain management: pain level controlled Vital Signs Assessment: post-procedure vital signs reviewed and stable Respiratory status: spontaneous breathing Cardiovascular status: stable Postop Assessment: no apparent nausea or vomiting Anesthetic complications: no     Last Vitals:  Vitals:   06/04/18 1205 06/04/18 1210  BP: (!) 154/98   Pulse: 75 76  Resp: (!) 9 11  Temp:    SpO2: 99% 100%    Last Pain:  Vitals:   06/04/18 1210  TempSrc:   PainSc: 5                  Faythe Heitzenrater A

## 2018-06-04 NOTE — Interval H&P Note (Signed)
History and Physical Interval Note:  06/04/2018 8:37 AM  [name] [name]  has presented today for surgery, with the diagnosis of Menorrhagia Dyspareyia Anemia  The various methods of treatment have been discussed with the patient and family. After consideration of risks, benefits and other options for treatment, the patient has consented to  Procedure(s) with comments: HYSTERECTOMY ABDOMINAL (Total) with Mini Lap incision with preservation of ovaries (N/A) - pt knows to arrive at 7:15 as a surgical intervention .  The patient's history has been reviewed, patient examined, no change in status, stable for surgery.  I have reviewed the patient's chart and labs.  Questions were answered to the patient's satisfaction.     Florian Buff

## 2018-06-04 NOTE — Anesthesia Preprocedure Evaluation (Signed)
Anesthesia Evaluation  Patient identified by MRN, date of birth, ID band Patient awake    Reviewed: Allergy & Precautions, NPO status , Patient's Chart, lab work & pertinent test results  Airway Mallampati: I  TM Distance: >3 FB Neck ROM: Full    Dental no notable dental hx. (+) Teeth Intact   Pulmonary neg pulmonary ROS, former smoker,    Pulmonary exam normal breath sounds clear to auscultation       Cardiovascular Exercise Tolerance: Good hypertension, Pt. on medications negative cardio ROS Normal cardiovascular examI Rhythm:Regular Rate:Normal     Neuro/Psych Anxiety Depression negative neurological ROS  negative psych ROS   GI/Hepatic Neg liver ROS, GERD  Medicated and Controlled,  Endo/Other  negative endocrine ROS  Renal/GU negative Renal ROS  negative genitourinary   Musculoskeletal negative musculoskeletal ROS (+)   Abdominal   Peds negative pediatric ROS (+)  Hematology negative hematology ROS (+)   Anesthesia Other Findings   Reproductive/Obstetrics negative OB ROS                             Anesthesia Physical Anesthesia Plan  ASA: II  Anesthesia Plan: General   Post-op Pain Management:    Induction: Intravenous  PONV Risk Score and Plan:   Airway Management Planned: Oral ETT  Additional Equipment:   Intra-op Plan:   Post-operative Plan: Extubation in OR  Informed Consent: I have reviewed the patients History and Physical, chart, labs and discussed the procedure including the risks, benefits and alternatives for the proposed anesthesia with the patient or authorized representative who has indicated his/her understanding and acceptance.   Dental advisory given  Plan Discussed with: CRNA  Anesthesia Plan Comments:         Anesthesia Quick Evaluation

## 2018-06-04 NOTE — Anesthesia Procedure Notes (Signed)
Procedure Name: Intubation Date/Time: 06/04/2018 9:20 AM Performed by: Alvy Bimler, CRNA Pre-anesthesia Checklist: Patient identified, Emergency Drugs available, Suction available and Patient being monitored Patient Re-evaluated:Patient Re-evaluated prior to induction Oxygen Delivery Method: Circle system utilized Preoxygenation: Pre-oxygenation with 100% oxygen Induction Type: IV induction Ventilation: Mask ventilation without difficulty Laryngoscope Size: Mac and 3 Grade View: Grade II Tube type: Oral Tube size: 7.0 mm Number of attempts: 1 Airway Equipment and Method: Stylet Placement Confirmation: ETT inserted through vocal cords under direct vision,  positive ETCO2 and breath sounds checked- equal and bilateral Secured at: 21 (right lip) cm Tube secured with: Tape Dental Injury: Teeth and Oropharynx as per pre-operative assessment

## 2018-06-04 NOTE — Transfer of Care (Signed)
Immediate Anesthesia Transfer of Care Note  Patient: [name] [name]  Procedure(s) Performed: TOTAL ABDOMINAL HYSTERECTOMY WITH LEFT OOPHORECTOMY (N/A )  Patient Location: PACU  Anesthesia Type:General  Level of Consciousness: awake, alert , oriented and patient cooperative  Airway & Oxygen Therapy: Patient Spontanous Breathing and Patient connected to face mask oxygen  Post-op Assessment: Report given to RN, Post -op Vital signs reviewed and stable and Patient moving all extremities X 4  Post vital signs: Reviewed and stable  Last Vitals:  Vitals Value Taken Time  BP 153/102 06/04/2018 11:00 AM  Temp    Pulse 102 06/04/2018 11:01 AM  Resp 13 06/04/2018 11:01 AM  SpO2 100 % 06/04/2018 11:01 AM  Vitals shown include unvalidated device data.  Last Pain:  Vitals:   06/04/18 1100  TempSrc:   PainSc: (P) 0-No pain      Patients Stated Pain Goal: 7 (20/23/34 3568)  Complications: No apparent anesthesia complications

## 2018-06-04 NOTE — Progress Notes (Signed)
Ambulated patient to bathroom twice within the hr. without difficulty, voiding well. Will continue to monitor.

## 2018-06-05 ENCOUNTER — Encounter (HOSPITAL_COMMUNITY): Payer: Self-pay | Admitting: Obstetrics & Gynecology

## 2018-06-05 LAB — BASIC METABOLIC PANEL
Anion gap: 8 (ref 5–15)
BUN: 6 mg/dL (ref 6–20)
CO2: 22 mmol/L (ref 22–32)
Calcium: 9.1 mg/dL (ref 8.9–10.3)
Chloride: 108 mmol/L (ref 98–111)
Creatinine, Ser: 0.76 mg/dL (ref 0.44–1.00)
GFR calc Af Amer: >60 mL/min (ref >60)
GFR calc non Af Amer: >60 mL/min (ref >60)
Glucose, Bld: 113 mg/dL — ABNORMAL HIGH (ref 70–99)
Potassium: 3.9 mmol/L (ref 3.5–5.1)
Sodium: 138 mmol/L (ref 135–145)

## 2018-06-05 LAB — CBC
HCT: 27.4 % — ABNORMAL LOW (ref 36.0–46.0)
Hemoglobin: 8.3 g/dL — ABNORMAL LOW (ref 12.0–15.0)
MCH: 27.9 pg (ref 26.0–34.0)
MCHC: 30.3 g/dL (ref 30.0–36.0)
MCV: 91.9 fL (ref 80.0–100.0)
Platelets: 454 10*3/uL — ABNORMAL HIGH (ref 150–400)
RBC: 2.98 MIL/uL — ABNORMAL LOW (ref 3.87–5.11)
RDW: 17.2 % — ABNORMAL HIGH (ref 11.5–15.5)
WBC: 11.2 10*3/uL — ABNORMAL HIGH (ref 4.0–10.5)
nRBC: 0 % (ref 0.0–0.2)

## 2018-06-05 MED ORDER — KETOROLAC TROMETHAMINE 10 MG PO TABS: 10.0000 mg | ORAL_TABLET | Freq: Three times a day (TID) | ORAL | 0 refills | Status: DC | PRN

## 2018-06-05 MED ORDER — ONDANSETRON HCL 8 MG PO TABS: 8.0000 mg | ORAL_TABLET | Freq: Four times a day (QID) | ORAL | 0 refills | Status: DC | PRN

## 2018-06-05 MED ORDER — OXYCODONE-ACETAMINOPHEN 5-325 MG PO TABS: 1.0000 | ORAL_TABLET | ORAL | 0 refills | Status: DC | PRN

## 2018-06-05 MED ORDER — LEVOFLOXACIN IN D5W 750 MG/150ML IV SOLN
750.0000 mg | Freq: Once | INTRAVENOUS | Status: AC
  Administered 2018-06-05: 750 mg via INTRAVENOUS
  Filled 2018-06-05: qty 150

## 2018-06-05 NOTE — Progress Notes (Signed)
Patient discharged home.  IV removed - WNL.  Reviewed AVS and medications.  Follow up in place.  Verbalizes understadning - no questions at this time.  Awaiting arrival of family for ride home, in NAD at this time

## 2018-06-05 NOTE — Discharge Instructions (Signed)
Abdominal Hysterectomy, Care After °This sheet gives you information about how to care for yourself after your procedure. Your health care provider may also give you more specific instructions. If you have problems or questions, contact your health care provider. °What can I expect after the procedure? °After your procedure, it is common to have: °· Pain. °· Fatigue. °· Poor appetite. °· Less interest in sex. °· Vaginal bleeding and discharge. You may need to use a sanitary napkin after this procedure. ° °Follow these instructions at home: °Bathing °· Do not take baths, swim, or use a hot tub until your health care provider approves. Ask your health care provider if you can take showers. You may only be allowed to take sponge baths for bathing. °· Keep the bandage (dressing) dry until your health care provider says it can be removed. °Incision care °· Follow instructions from your health care provider about how to take care of your incision. Make sure you: °? Wash your hands with soap and water before you change your bandage (dressing). If soap and water are not available, use hand sanitizer. °? Change your dressing as told by your health care provider. °? Leave stitches (sutures), skin glue, or adhesive strips in place. These skin closures may need to stay in place for 2 weeks or longer. If adhesive strip edges start to loosen and curl up, you may trim the loose edges. Do not remove adhesive strips completely unless your health care provider tells you to do that. °· Check your incision area every day for signs of infection. Check for: °? Redness, swelling, or pain. °? Fluid or blood. °? Warmth. °? Pus or a bad smell. °Activity °· Do gentle, daily exercises as told by your health care provider. You may be told to take short walks every day and go farther each time. °· Do not lift anything that is heavier than 10 lb (4.5 kg), or the limit that your health care provider tells you, until he or she says that it is  safe. °· Do not drive or use heavy machinery while taking prescription pain medicine. °· Do not drive for 24 hours if you were given a medicine to help you relax (sedative). °· Follow your health care provider's instructions about exercise, driving, and general activities. Ask your health care provider what activities are safe for you. °Lifestyle °· Do not douche, use tampons, or have sex for at least 6 weeks or as told by your health care provider. °· Do not drink alcohol until your health care provider approves. °· Drink enough fluid to keep your urine clear or pale yellow. °· Try to have someone at home with you for the first 1-2 weeks to help. °· Do not use any products that contain nicotine or tobacco, such as cigarettes and e-cigarettes. These can delay healing. If you need help quitting, ask your health care provider. °General instructions °· Take over-the-counter and prescription medicines only as told by your health care provider. °· Do not take aspirin or ibuprofen. These medicines can cause bleeding. °· To prevent or treat constipation while you are taking prescription pain medicine, your health care provider may recommend that you: °? Drink enough fluid to keep your urine clear or pale yellow. °? Take over-the-counter or prescription medicines. °? Eat foods that are high in fiber, such as fresh fruits and vegetables, whole grains, and beans. °? Limit foods that are high in fat and processed sugars, such as fried and sweet foods. °· Keep all   follow-up visits as told by your health care provider. This is important. °Contact a health care provider if: °· You have chills or fever. °· You have redness, swelling, or pain around your incision. °· You have fluid or blood coming from your incision. °· Your incision feels warm to the touch. °· You have pus or a bad smell coming from your incision. °· Your incision breaks open. °· You feel dizzy or light-headed. °· You have pain or bleeding when you urinate. °· You  have persistent diarrhea. °· You have persistent nausea and vomiting. °· You have abnormal vaginal discharge. °· You have a rash. °· You have any type of abnormal reaction or you develop an allergy to your medicine. °· Your pain medicine does not help. °Get help right away if: °· You have a fever and your symptoms suddenly get worse. °· You have severe abdominal pain. °· You have shortness of breath. °· You faint. °· You have pain, swelling, or redness in your leg. °· You have heavy vaginal bleeding with blood clots. °Summary °· After your procedure, it is common to have pain, fatigue and vaginal discharge. °· Do not take baths, swim, or use a hot tub until your health care provider approves. Ask your health care provider if you can take showers. You may only be allowed to take sponge baths for bathing. °· Follow your health care provider's instructions about exercise, driving, and general activities. Ask your health care provider what activities are safe for you. °· Do not lift anything that is heavier than 10 lb (4.5 kg), or the limit that your health care provider tells you, until he or she says that it is safe. °· Try to have someone at home with you for the first 1-2 weeks to help. °This information is not intended to replace advice given to you by your health care provider. Make sure you discuss any questions you have with your health care provider. °Document Released: 03/02/2005 Document Revised: 08/01/2016 Document Reviewed: 08/01/2016 °Elsevier Interactive Patient Education © 2017 [employer] ° °

## 2018-06-05 NOTE — Discharge Summary (Signed)
Physician Discharge Summary  Patient ID: [name] [name] MRN: [id number] DOB/AGE: 48-Dec-1972 48 y.o.  Admit date: 06/04/2018 Discharge date: 06/05/2018  Admission Diagnoses: Menometrorrhagia dysmenorrhea anemia  Discharge Diagnoses:  Active Problems:   S/P hysterectomy   Discharged Condition: good  Hospital Course: unremarkable  Consults: None  Significant Diagnostic Studies: labs:   Results for orders placed or performed during the hospital encounter of 06/04/18 (from the past 24 hour(s))  CBC     Status: Abnormal   Collection Time: 06/05/18  4:54 AM  Result Value Ref Range   WBC 11.2 (H) 4.0 - 10.5 K/uL   RBC 2.98 (L) 3.87 - 5.11 MIL/uL   Hemoglobin 8.3 (L) 12.0 - 15.0 g/dL   HCT 27.4 (L) 36.0 - 46.0 %   MCV 91.9 80.0 - 100.0 fL   MCH 27.9 26.0 - 34.0 pg   MCHC 30.3 30.0 - 36.0 g/dL   RDW 17.2 (H) 11.5 - 15.5 %   Platelets 454 (H) 150 - 400 K/uL   nRBC 0.0 0.0 - 0.2 %  Basic metabolic panel     Status: Abnormal   Collection Time: 06/05/18  4:54 AM  Result Value Ref Range   Sodium 138 135 - 145 mmol/L   Potassium 3.9 3.5 - 5.1 mmol/L   Chloride 108 98 - 111 mmol/L   CO2 22 22 - 32 mmol/L   Glucose, Bld 113 (H) 70 - 99 mg/dL   BUN 6 6 - 20 mg/dL   Creatinine, Ser 0.76 0.44 - 1.00 mg/dL   Calcium 9.1 8.9 - 10.3 mg/dL   GFR calc non Af Amer >60 >60 mL/min   GFR calc Af Amer >60 >60 mL/min   Anion gap 8 5 - 15    Treatments: surgery: TAH LO  Discharge Exam: Blood pressure 127/81, pulse 89, temperature 98.7 F (37.1 C), temperature source Oral, resp. rate 16, last menstrual period 04/20/2018, SpO2 98 %. General appearance: alert, cooperative and no distress GI: soft, non-tender; bowel sounds normal; no masses,  no organomegaly Incision/Wound:clean dry intact  Disposition: Discharge disposition: 01-Home or Self Care       Discharge Instructions    Call MD for:  persistant nausea and vomiting   Complete by:  As directed    Call MD for:  severe  uncontrolled pain   Complete by:  As directed    Call MD for:  temperature >100.4   Complete by:  As directed    Diet - low sodium heart healthy   Complete by:  As directed    Driving Restrictions   Complete by:  As directed    No driving for 1 week   Increase activity slowly   Complete by:  As directed    Leave dressing on - Keep it clean, dry, and intact until clinic visit   Complete by:  As directed    Lifting restrictions   Complete by:  As directed    Do not lift more than 10 pounds   Sexual Activity Restrictions   Complete by:  As directed    No sex for 6 weeks     Allergies as of 06/05/2018   No Known Allergies     Medication List    STOP taking these medications   ferrous sulfate 325 (65 FE) MG tablet   ibuprofen 200 MG tablet Commonly known as:  ADVIL,MOTRIN   megestrol 40 MG tablet Commonly known as:  MEGACE   metroNIDAZOLE 500 MG tablet Commonly known as:  FLAGYL     TAKE these medications   aspirin-acetaminophen-caffeine 250-250-65 MG tablet Commonly known as:  EXCEDRIN MIGRAINE Take 1 tablet by mouth daily as needed for headache.   busPIRone 5 MG tablet Commonly known as:  BUSPAR Take 1 tablet (5 mg total) by mouth 2 (two) times daily.   clonazePAM 1 MG tablet Commonly known as:  KLONOPIN One tablet at bedtime for extreme uncontrolled anxiety What changed:    how much to take  how to take this  when to take this  reasons to take this  additional instructions   FLUoxetine 20 MG tablet Commonly known as:  PROZAC Take 1 tablet (20 mg total) by mouth daily.   ketorolac 10 MG tablet Commonly known as:  TORADOL Take 1 tablet (10 mg total) by mouth every 8 (eight) hours as needed.   lansoprazole 30 MG capsule Commonly known as:  PREVACID Take 30 mg by mouth daily.   ondansetron 8 MG tablet Commonly known as:  ZOFRAN Take 1 tablet (8 mg total) by mouth every 6 (six) hours as needed for nausea.   oxyCODONE-acetaminophen 5-325 MG  tablet Commonly known as:  PERCOCET/ROXICET Take 1 tablet by mouth every 4 (four) hours as needed for moderate pain ((when tolerating fluids)).   rosuvastatin 10 MG tablet Commonly known as:  CRESTOR Take 1 tablet (10 mg total) by mouth daily.      Follow-up Information    Florian Buff, MD Follow up on 06/12/2018.   Specialties:  Obstetrics and Gynecology, Radiology Contact information: [place] [address] [phone]           Signed: Florian Buff 06/05/2018, 1:42 PM

## 2018-06-09 ENCOUNTER — Ambulatory Visit: Payer: Self-pay | Admitting: Family Medicine

## 2018-06-12 ENCOUNTER — Other Ambulatory Visit: Payer: Self-pay

## 2018-06-12 ENCOUNTER — Ambulatory Visit (INDEPENDENT_AMBULATORY_CARE_PROVIDER_SITE_OTHER): Payer: Commercial Managed Care - PPO | Admitting: Obstetrics & Gynecology

## 2018-06-12 ENCOUNTER — Encounter: Payer: Self-pay | Admitting: Obstetrics & Gynecology

## 2018-06-12 VITALS — BP 112/75 | HR 79 | Ht 60.0 in | Wt 123.0 lb

## 2018-06-12 DIAGNOSIS — Z9889 Other specified postprocedural states: Secondary | ICD-10-CM

## 2018-06-12 DIAGNOSIS — Z90710 Acquired absence of both cervix and uterus: Secondary | ICD-10-CM

## 2018-06-12 NOTE — Progress Notes (Signed)
  HPI: Patient returns for routine postoperative follow-up having undergone TAH LSO on 06/04/2018.  The patient's immediate postoperative recovery has been unremarkable. Since hospital discharge the patient reports no problems.   Current Outpatient Medications: busPIRone (BUSPAR) 5 MG tablet, Take 1 tablet (5 mg total) by mouth 2 (two) times daily., Disp: 60 tablet, Rfl: 5 clonazePAM (KLONOPIN) 1 MG tablet, One tablet at bedtime for extreme uncontrolled anxiety (Patient taking differently: Take 1 mg by mouth daily as needed for anxiety. ), Disp: 20 tablet, Rfl: 0 FLUoxetine (PROZAC) 20 MG tablet, Take 1 tablet (20 mg total) by mouth daily., Disp: 30 tablet, Rfl: 3 lansoprazole (PREVACID) 30 MG capsule, Take 30 mg by mouth daily. , Disp: , Rfl:  ondansetron (ZOFRAN) 8 MG tablet, Take 1 tablet (8 mg total) by mouth every 6 (six) hours as needed for nausea., Disp: 20 tablet, Rfl: 0 rosuvastatin (CRESTOR) 10 MG tablet, Take 1 tablet (10 mg total) by mouth daily., Disp: 30 tablet, Rfl: 5 aspirin-acetaminophen-caffeine (EXCEDRIN MIGRAINE) 250-250-65 MG tablet, Take 1 tablet by mouth daily as needed for headache., Disp: , Rfl:  ketorolac (TORADOL) 10 MG tablet, Take 1 tablet (10 mg total) by mouth every 8 (eight) hours as needed. (Patient not taking: Reported on 06/12/2018), Disp: 15 tablet, Rfl: 0 oxyCODONE-acetaminophen (PERCOCET/ROXICET) 5-325 MG tablet, Take 1 tablet by mouth every 4 (four) hours as needed for moderate pain ((when tolerating fluids)). (Patient not taking: Reported on 06/12/2018), Disp: 30 tablet, Rfl: 0  No current facility-administered medications for this visit.     Blood pressure 112/75, pulse 79, height 5' (1.524 m), weight 123 lb (55.8 kg), last menstrual period 04/20/2018.  Physical Exam: Obvious wound hematoma present Evacuated with 7 Fr urinary catheter + 1/2 peroxide 1/2 water with excellent results Abdomen otherwise is benign  Diagnostic  Tests:   Pathology: benign  Impression: S/p TAH LSO with post op wound hematoma, evacuated  Plan: Follow up 4 days for eval of the wound again  Follow up: 4  days  Florian Buff, MD

## 2018-06-15 ENCOUNTER — Other Ambulatory Visit: Payer: Self-pay | Admitting: Family Medicine

## 2018-06-16 ENCOUNTER — Encounter: Payer: Self-pay | Admitting: Obstetrics & Gynecology

## 2018-06-16 ENCOUNTER — Ambulatory Visit (INDEPENDENT_AMBULATORY_CARE_PROVIDER_SITE_OTHER): Payer: Commercial Managed Care - PPO | Admitting: Obstetrics & Gynecology

## 2018-06-16 VITALS — BP 125/74 | HR 78 | Ht 60.0 in | Wt 123.0 lb

## 2018-06-16 DIAGNOSIS — Z90710 Acquired absence of both cervix and uterus: Secondary | ICD-10-CM

## 2018-06-16 NOTE — Progress Notes (Signed)
  HPI: Patient returns for routine postoperative follow-up having undergone TAH on 06/04/2018.  The patient's immediate postoperative recovery has been unremarkable. Since hospital discharge the patient reports no problems minimal drainage.   Current Outpatient Medications: busPIRone (BUSPAR) 5 MG tablet, Take 1 tablet (5 mg total) by mouth 2 (two) times daily., Disp: 60 tablet, Rfl: 5 clonazePAM (KLONOPIN) 1 MG tablet, One tablet at bedtime for extreme uncontrolled anxiety (Patient taking differently: Take 1 mg by mouth daily as needed for anxiety. ), Disp: 20 tablet, Rfl: 0 FLUoxetine (PROZAC) 20 MG tablet, Take 1 tablet (20 mg total) by mouth daily., Disp: 30 tablet, Rfl: 3 lansoprazole (PREVACID) 30 MG capsule, Take 30 mg by mouth daily. , Disp: , Rfl:  ondansetron (ZOFRAN) 8 MG tablet, Take 1 tablet (8 mg total) by mouth every 6 (six) hours as needed for nausea., Disp: 20 tablet, Rfl: 0 rosuvastatin (CRESTOR) 10 MG tablet, Take 1 tablet (10 mg total) by mouth daily., Disp: 30 tablet, Rfl: 5 aspirin-acetaminophen-caffeine (EXCEDRIN MIGRAINE) 250-250-65 MG tablet, Take 1 tablet by mouth daily as needed for headache., Disp: , Rfl:  ketorolac (TORADOL) 10 MG tablet, Take 1 tablet (10 mg total) by mouth every 8 (eight) hours as needed. (Patient not taking: Reported on 06/12/2018), Disp: 15 tablet, Rfl: 0 oxyCODONE-acetaminophen (PERCOCET/ROXICET) 5-325 MG tablet, Take 1 tablet by mouth every 4 (four) hours as needed for moderate pain ((when tolerating fluids)). (Patient not taking: Reported on 06/12/2018), Disp: 30 tablet, Rfl: 0  No current facility-administered medications for this visit.     Blood pressure 125/74, pulse 78, height 5' (1.524 m), weight 123 lb (55.8 kg), last menstrual period 04/20/2018.  Physical Exam: Incision some reaccumulation of hematoma but not infected Pt did get post op lovenox  Diagnostic Tests:   Pathology:   Impression: S/p TAH with post op wound  hematoma  Plan:   Follow up: 1  weeks  Florian Buff, MD

## 2018-06-24 ENCOUNTER — Encounter: Payer: Commercial Managed Care - PPO | Admitting: Obstetrics & Gynecology

## 2018-06-24 ENCOUNTER — Ambulatory Visit (INDEPENDENT_AMBULATORY_CARE_PROVIDER_SITE_OTHER): Payer: Commercial Managed Care - PPO | Admitting: Obstetrics & Gynecology

## 2018-06-24 ENCOUNTER — Encounter: Payer: Self-pay | Admitting: Obstetrics & Gynecology

## 2018-06-24 VITALS — BP 120/85 | HR 78 | Ht 60.0 in | Wt 125.0 lb

## 2018-06-24 DIAGNOSIS — Z9889 Other specified postprocedural states: Secondary | ICD-10-CM

## 2018-06-24 DIAGNOSIS — Z90710 Acquired absence of both cervix and uterus: Secondary | ICD-10-CM

## 2018-06-24 NOTE — Progress Notes (Signed)
  HPI: Patient returns for routine postoperative follow-up having undergone TAH on 06/04/2018.  The patient's immediate postoperative recovery has been unremarkable. Since hospital discharge the patient reports doing great.   Current Outpatient Medications: aspirin-acetaminophen-caffeine (EXCEDRIN MIGRAINE) 250-250-65 MG tablet, Take 1 tablet by mouth daily as needed for headache., Disp: , Rfl:  busPIRone (BUSPAR) 5 MG tablet, Take 1 tablet (5 mg total) by mouth 2 (two) times daily., Disp: 60 tablet, Rfl: 5 clonazePAM (KLONOPIN) 1 MG tablet, One tablet at bedtime for extreme uncontrolled anxiety (Patient taking differently: Take 1 mg by mouth daily as needed for anxiety. ), Disp: 20 tablet, Rfl: 0 FLUoxetine (PROZAC) 20 MG tablet, TAKE 1 TABLET(20 MG) BY MOUTH DAILY, Disp: 30 tablet, Rfl: 5 lansoprazole (PREVACID) 30 MG capsule, Take 30 mg by mouth daily. , Disp: , Rfl:  rosuvastatin (CRESTOR) 10 MG tablet, Take 1 tablet (10 mg total) by mouth daily., Disp: 30 tablet, Rfl: 5  No current facility-administered medications for this visit.     Blood pressure 120/85, pulse 78, height 5' (1.524 m), weight 125 lb (56.7 kg), last menstrual period 04/20/2018.  Physical Exam: Incision healing well, fully resolved hematoma  Diagnostic Tests:   Pathology: benign  Impression: S/P TAH with post op wound hematoma(had post op lovenox) Resolved hematoma with outpatient management  Plan:   Follow up: 3  weeks  Florian Buff, MD

## 2018-07-15 ENCOUNTER — Encounter: Payer: Self-pay | Admitting: Obstetrics & Gynecology

## 2018-07-15 ENCOUNTER — Ambulatory Visit (INDEPENDENT_AMBULATORY_CARE_PROVIDER_SITE_OTHER): Payer: Commercial Managed Care - PPO | Admitting: Obstetrics & Gynecology

## 2018-07-15 VITALS — BP 128/87 | HR 85 | Ht 60.0 in | Wt 129.5 lb

## 2018-07-15 DIAGNOSIS — Z90710 Acquired absence of both cervix and uterus: Secondary | ICD-10-CM

## 2018-07-15 MED ORDER — METRONIDAZOLE 500 MG PO TABS: 500.0000 mg | ORAL_TABLET | Freq: Two times a day (BID) | ORAL | 0 refills | Status: DC

## 2018-07-15 NOTE — Progress Notes (Signed)
  HPI: Patient returns for routine postoperative follow-up having undergone TAH LSO  on 06/04/2018.  The patient's immediate postoperative recovery has been unremarkable. Since hospital discharge the patient reports some discharge.   Current Outpatient Medications: aspirin-acetaminophen-caffeine (EXCEDRIN MIGRAINE) 250-250-65 MG tablet, Take 1 tablet by mouth daily as needed for headache., Disp: , Rfl:  busPIRone (BUSPAR) 5 MG tablet, Take 1 tablet (5 mg total) by mouth 2 (two) times daily., Disp: 60 tablet, Rfl: 5 clonazePAM (KLONOPIN) 1 MG tablet, One tablet at bedtime for extreme uncontrolled anxiety (Patient taking differently: Take 1 mg by mouth daily as needed for anxiety. ), Disp: 20 tablet, Rfl: 0 FLUoxetine (PROZAC) 20 MG tablet, TAKE 1 TABLET(20 MG) BY MOUTH DAILY, Disp: 30 tablet, Rfl: 5 lansoprazole (PREVACID) 30 MG capsule, Take 30 mg by mouth daily. , Disp: , Rfl:  rosuvastatin (CRESTOR) 10 MG tablet, Take 1 tablet (10 mg total) by mouth daily., Disp: 30 tablet, Rfl: 5  No current facility-administered medications for this visit.     Blood pressure 128/87, pulse 85, height 5' (1.524 m), weight 129 lb 8 oz (58.7 kg), last menstrual period 04/20/2018.  Physical Exam: Incision clean dry intact Vagina healing well minimal discharge consistent with BV  Diagnostic Tests:   Pathology: benign  Impression: S/P TAH LSO  Plan: Meds ordered this encounter  Medications  . metroNIDAZOLE (FLAGYL) 500 MG tablet    Sig: Take 1 tablet (500 mg total) by mouth 2 (two) times daily.    Dispense:  14 tablet    Refill:  0     Follow up: 1  years  Florian Buff, MD

## 2018-07-23 ENCOUNTER — Telehealth: Payer: Self-pay | Admitting: Obstetrics & Gynecology

## 2018-07-23 MED ORDER — FLUCONAZOLE 150 MG PO TABS: 150.0000 mg | ORAL_TABLET | Freq: Once | ORAL | 0 refills | Status: AC

## 2018-07-23 NOTE — Telephone Encounter (Signed)
Needs to talk to a nurse about getting something for yeast infection/ [facility]

## 2018-07-23 NOTE — Telephone Encounter (Signed)
done

## 2018-07-23 NOTE — Telephone Encounter (Signed)
Pt called stating that she thinks she has a yeast infections. She c/o thick white discharge with itching, no odor. She is  Requesting medication to be sent to her pharmacy. Advised that I would send her request to a provider and she could check with her pharmacy later today. Pt verbalized understanding.

## 2018-07-28 ENCOUNTER — Other Ambulatory Visit: Payer: Self-pay | Admitting: Family Medicine

## 2018-08-07 ENCOUNTER — Other Ambulatory Visit: Payer: Self-pay | Admitting: Family Medicine

## 2018-08-11 ENCOUNTER — Telehealth: Payer: Self-pay | Admitting: *Deleted

## 2018-08-11 NOTE — Telephone Encounter (Signed)
Pt called and stated she was out of clonazepam 1 mg. She would like a refill sent to [facility] in [place].

## 2018-08-12 ENCOUNTER — Other Ambulatory Visit: Payer: Self-pay | Admitting: Family Medicine

## 2018-08-12 NOTE — Telephone Encounter (Signed)
Medication requested has been sent pls l

## 2018-08-13 ENCOUNTER — Telehealth: Payer: Self-pay | Admitting: *Deleted

## 2018-08-13 NOTE — Telephone Encounter (Signed)
Advised patient script has been sent into [facility] on [address]

## 2018-08-13 NOTE — Telephone Encounter (Signed)
Pt was calling requesting refill on clonazepam 1 mg. She called the other day but nothing has been sent to the pharmacy. She would like a call back.

## 2018-08-14 ENCOUNTER — Other Ambulatory Visit: Payer: Self-pay

## 2018-08-14 MED ORDER — CLONAZEPAM 1 MG PO TABS: ORAL_TABLET | ORAL | 0 refills | Status: DC

## 2018-08-14 NOTE — Telephone Encounter (Signed)
Pharmacy doesn't have script. Has it been sent it??

## 2018-08-14 NOTE — Telephone Encounter (Signed)
pls print and fax after I sign, written 08/07/2018

## 2018-08-14 NOTE — Telephone Encounter (Signed)
Pt is calling questioning the Prescription, I called [facility] and they do not have it there Advised Pt I would send a note to Dr Moshe Cipro, and for her to call [facility] in a couple of hours.

## 2018-08-14 NOTE — Telephone Encounter (Signed)
Script printed and doctor signed. Original script printed 08/07/18. Pharmacy states they never received the script. I personally faxed script to pharmacy on patients chart today...Marland KitchenAW

## 2018-08-14 NOTE — Progress Notes (Signed)
Clonazepam script never received by pharmacy. Reprinted and signed by MD and faxed to pharmacy.AW

## 2018-08-20 ENCOUNTER — Other Ambulatory Visit: Payer: Self-pay | Admitting: Family Medicine

## 2018-09-01 ENCOUNTER — Other Ambulatory Visit: Payer: Self-pay | Admitting: Family Medicine

## 2018-10-27 NOTE — Telephone Encounter (Signed)
Note sent to nurse. 

## 2018-12-04 ENCOUNTER — Telehealth: Payer: Self-pay | Admitting: Adult Health

## 2018-12-04 MED ORDER — METRONIDAZOLE 500 MG PO TABS: 500.0000 mg | ORAL_TABLET | Freq: Two times a day (BID) | ORAL | 0 refills | Status: DC

## 2018-12-04 NOTE — Telephone Encounter (Signed)
Pt feels like she has bv again. Would like prescription sent to [facility].

## 2018-12-04 NOTE — Telephone Encounter (Signed)
Refilled flagyl 

## 2018-12-04 NOTE — Addendum Note (Signed)
Addended by: Derrek Monaco A on: 12/04/2018 10:46 AM   Modules accepted: Orders

## 2018-12-04 NOTE — Telephone Encounter (Signed)
Patient called stating that she would like from [place] to call her in a medication for her BV. Patient states that Anderson Malta has Prescribed her this medication before. Patient uses [facility] on [address].

## 2018-12-11 ENCOUNTER — Encounter: Payer: Self-pay | Admitting: Family Medicine

## 2018-12-16 ENCOUNTER — Other Ambulatory Visit: Payer: Self-pay

## 2018-12-16 ENCOUNTER — Ambulatory Visit (INDEPENDENT_AMBULATORY_CARE_PROVIDER_SITE_OTHER): Payer: Self-pay | Admitting: Family Medicine

## 2018-12-16 ENCOUNTER — Encounter: Payer: Self-pay | Admitting: Family Medicine

## 2018-12-16 VITALS — BP 120/80 | Ht 60.0 in | Wt 129.0 lb

## 2018-12-16 DIAGNOSIS — F411 Generalized anxiety disorder: Secondary | ICD-10-CM

## 2018-12-16 DIAGNOSIS — J0100 Acute maxillary sinusitis, unspecified: Secondary | ICD-10-CM

## 2018-12-16 DIAGNOSIS — J309 Allergic rhinitis, unspecified: Secondary | ICD-10-CM

## 2018-12-16 DIAGNOSIS — F339 Major depressive disorder, recurrent, unspecified: Secondary | ICD-10-CM

## 2018-12-16 DIAGNOSIS — H9202 Otalgia, left ear: Secondary | ICD-10-CM

## 2018-12-16 DIAGNOSIS — J320 Chronic maxillary sinusitis: Secondary | ICD-10-CM | POA: Insufficient documentation

## 2018-12-16 MED ORDER — AZITHROMYCIN 250 MG PO TABS: ORAL_TABLET | ORAL | 0 refills | Status: DC

## 2018-12-16 MED ORDER — FLUCONAZOLE 150 MG PO TABS: ORAL_TABLET | ORAL | 0 refills | Status: DC

## 2018-12-16 MED ORDER — MONTELUKAST SODIUM 10 MG PO TABS: 10.0000 mg | ORAL_TABLET | Freq: Every day | ORAL | 3 refills | Status: DC

## 2018-12-16 MED ORDER — PREDNISONE 5 MG PO TABS: 5.0000 mg | ORAL_TABLET | Freq: Two times a day (BID) | ORAL | 0 refills | Status: AC

## 2018-12-16 NOTE — Assessment & Plan Note (Signed)
Controlled, no change in medication  

## 2018-12-16 NOTE — Assessment & Plan Note (Addendum)
Increased symptoms with season change , pt re educatd re the need to take allergy medication regularly, also short course of prednisone

## 2018-12-16 NOTE — Assessment & Plan Note (Signed)
3 day history of symptoms, azithromycin is precribed

## 2018-12-16 NOTE — Progress Notes (Signed)
Virtual Visit via Telephone Note  I connected with [name] [name] on 12/16/18 at 10:00 AM EDT by telephone and verified that I am speaking with the correct person using two identifiers.   I discussed the limitations, risks, security and privacy concerns of performing an evaluation and management service by telephone and the availability of in person appointments. I also discussed with the patient that there may be a patient responsible charge related to this service. The patient expressed understanding and agreed to proceed.  I am in the office and the patient is  On her job outside of her home   History of Present Illness: 3 day h/o right maxillary pressure with yellowish drainage, and left ear pain, also tender neck glands, has had intermittent chills but no documented fever Denies productive cough, experiencing increased allergy symptoms in past several weeks as expected at this time of the ear    Observations/Objective: BP 120/80   Ht 5' (1.524 m)   Wt 129 lb (58.5 kg)   LMP 04/20/2018   BMI 25.19 kg/m    Assessment and Plan: Allergic rhinitis Increased symptoms with season change , pt re educatd re the need to take allergy medication regularly, also short course of prednisone  Maxillary sinusitis 3 day history of symptoms, azithromycin is precribed  Otalgia, left 3 day history, azithromycin prescribed with fluconazole  Depression, recurrent (HCC) Controlled , no change in management  Generalized anxiety disorder Controlled, no change in medication     Follow Up Instructions:    I discussed the assessment and treatment plan with the patient. The patient was provided an opportunity to ask questions and all were answered. The patient agreed with the plan and demonstrated an understanding of the instructions.   The patient was advised to call back or seek an in-person evaluation if the symptoms worsen or if the condition fails to improve as anticipated.  I  provided  15 minutes of non-face-to-face time during this encounter.   Tula Nakayama, MD

## 2018-12-16 NOTE — Patient Instructions (Signed)
F/U as before, call if you need me sooner  You are treated for sinus infection and ear infection, 4 medications have been prescribed for this  Thanks for choosing [facility], we consider it a privelige to serve you.   Marland Kitchen

## 2018-12-16 NOTE — Assessment & Plan Note (Signed)
3 day history, azithromycin prescribed with fluconazole

## 2018-12-16 NOTE — Assessment & Plan Note (Signed)
Controlled , no change in management 

## 2018-12-25 ENCOUNTER — Encounter: Payer: Self-pay | Admitting: Family Medicine

## 2019-01-04 ENCOUNTER — Other Ambulatory Visit: Payer: Self-pay | Admitting: Family Medicine

## 2019-01-14 ENCOUNTER — Telehealth: Payer: Self-pay | Admitting: Family Medicine

## 2019-01-14 NOTE — Telephone Encounter (Signed)
I have resubmitted more information to the insurance company for review

## 2019-01-14 NOTE — Telephone Encounter (Signed)
Pt is calling since the insurance is not paying for her acid medicine, can it be changed to Omeprazole?

## 2019-01-15 NOTE — Telephone Encounter (Signed)
Please send the Omeprazole to the pharmacy--she will use the RX card

## 2019-01-15 NOTE — Telephone Encounter (Signed)
Pt called back wants everything left the same--NO CHANGES  Send to [facility]

## 2019-01-21 ENCOUNTER — Telehealth: Payer: Self-pay | Admitting: Family Medicine

## 2019-01-21 ENCOUNTER — Other Ambulatory Visit: Payer: Self-pay | Admitting: Family Medicine

## 2019-01-21 MED ORDER — PANTOPRAZOLE SODIUM 40 MG PO TBEC: 40.0000 mg | DELAYED_RELEASE_TABLET | Freq: Two times a day (BID) | ORAL | 5 refills | Status: DC

## 2019-01-21 NOTE — Telephone Encounter (Signed)
pls let pt know prevacid not covered, has to try protoniix 80 mg daily or omeprazole 80 mg daily first I have entered and sent the protonix

## 2019-01-21 NOTE — Telephone Encounter (Signed)
Pt already aware.

## 2019-01-25 ENCOUNTER — Other Ambulatory Visit: Payer: Self-pay | Admitting: Family Medicine

## 2019-01-29 ENCOUNTER — Other Ambulatory Visit: Payer: Self-pay

## 2019-01-29 NOTE — Telephone Encounter (Signed)
Prevacid added back to patients med list and Protonix d/c'ed per patient request

## 2019-01-29 NOTE — Telephone Encounter (Signed)
Leave the medicine the same --she wants lansoprozole..  She DOESN'T want the other---please change  If there is any questions please call the PT.

## 2019-02-03 ENCOUNTER — Telehealth: Payer: Self-pay | Admitting: Family Medicine

## 2019-02-03 NOTE — Telephone Encounter (Signed)
pls order CBC, fasting lipid, cmp and EGFr and tSH, also send report to Jarrett Soho who will see her

## 2019-02-03 NOTE — Telephone Encounter (Signed)
What labs do you want to order

## 2019-02-03 NOTE — Telephone Encounter (Signed)
Pt has an appointment for CPE no pap with Jarrett Soho, and needs labs ordered, and FAXED to [phone]

## 2019-02-04 ENCOUNTER — Telehealth: Payer: Self-pay

## 2019-02-04 DIAGNOSIS — E785 Hyperlipidemia, unspecified: Secondary | ICD-10-CM

## 2019-02-04 DIAGNOSIS — E559 Vitamin D deficiency, unspecified: Secondary | ICD-10-CM

## 2019-02-04 NOTE — Telephone Encounter (Signed)
Labs ordered per previous message

## 2019-02-10 ENCOUNTER — Encounter: Payer: Self-pay | Admitting: Family Medicine

## 2019-02-19 ENCOUNTER — Encounter: Payer: Self-pay | Admitting: Family Medicine

## 2019-02-19 ENCOUNTER — Ambulatory Visit (INDEPENDENT_AMBULATORY_CARE_PROVIDER_SITE_OTHER): Payer: Commercial Managed Care - PPO | Admitting: Family Medicine

## 2019-02-19 ENCOUNTER — Other Ambulatory Visit: Payer: Self-pay

## 2019-02-19 VITALS — BP 131/93 | HR 77 | Temp 98.8°F | Resp 12 | Ht 60.0 in | Wt 140.1 lb

## 2019-02-19 DIAGNOSIS — E559 Vitamin D deficiency, unspecified: Secondary | ICD-10-CM

## 2019-02-19 DIAGNOSIS — Z0000 Encounter for general adult medical examination without abnormal findings: Secondary | ICD-10-CM | POA: Diagnosis not present

## 2019-02-19 DIAGNOSIS — E785 Hyperlipidemia, unspecified: Secondary | ICD-10-CM | POA: Diagnosis not present

## 2019-02-19 DIAGNOSIS — F339 Major depressive disorder, recurrent, unspecified: Secondary | ICD-10-CM | POA: Diagnosis not present

## 2019-02-19 DIAGNOSIS — F411 Generalized anxiety disorder: Secondary | ICD-10-CM

## 2019-02-19 DIAGNOSIS — E663 Overweight: Secondary | ICD-10-CM

## 2019-02-19 DIAGNOSIS — J309 Allergic rhinitis, unspecified: Secondary | ICD-10-CM

## 2019-02-19 DIAGNOSIS — K219 Gastro-esophageal reflux disease without esophagitis: Secondary | ICD-10-CM

## 2019-02-19 MED ORDER — DULOXETINE HCL 30 MG PO CSDR: 30.0000 mg | DELAYED_RELEASE_CAPSULE | Freq: Every day | ORAL | 1 refills | Status: DC

## 2019-02-19 NOTE — Patient Instructions (Addendum)
    Thank you for coming into the office today. I appreciate the opportunity to provide you with the care for your health and wellness. Today we discussed: overall health    Follow Up: 4 weeks with Jarrett Soho  NEW MEDS:  PLEASE STOP PROZAC (taper every other day for a week and start below then)  PLEASE START CYMBALTA (start with 30 mg daily; can increase to 60 mg after 1-2 weeks pending how you are feeling). PLEASE DON'T START THIS UNTIL YOU FINISH TAPER OF PROZAC  Please look to getting self care in your daily routine. YOGA, WALKS, MEDITATION, READING, MUSIC  30 minutes on a daily basis, if not longer, if possible.  [place] YOUR HANDS WELL AND FREQUENTLY. AVOID TOUCHING YOUR FACE, UNLESS YOUR HANDS ARE FRESHLY WASHED.  GET FRESH AIR DAILY. STAY HYDRATED WITH WATER.   It was a pleasure to see you and I look forward to continuing to work together on your health and well-being. Please do not hesitate to call the office if you need care or have questions about your care.  Have a wonderful day and week.  With Gratitude,  Cherly Beach, DNP, AGNP-BC

## 2019-02-19 NOTE — Progress Notes (Signed)
Health Maintenance reviewed   Immunization History  Administered Date(s) Administered   Influenza Whole 06/06/2009   Td 12/28/2009   Last Pap smear: 12/2017 Last mammogram: 07/2018  Last colonoscopy: years ago-normal due 2 years  Last DEXA: n/a Dentist: June 2020 (last visit) Ophtho: has not been this year yet Exercise: Not regimen. Was walking, was wanting to get in gym, but COVID changed that plan    Other doctors caring for patient include:  Patient Care Team: Fayrene Helper, MD as PCP - [facility], Marga Melnick, MD (Gastroenterology)  End of Life Discussion:  Patient does not have a living will and medical power of attorney    Code Status: Prior   Subjective:   HPI  [name] [name] is a 48 y.o. female who presents for annual wellness visit and follow-up on chronic medical conditions.  She has the following concerns: Ongoing irritability and anxiety not currently controlled with her medications that she is taking at this time.  Reports that she is taking all her medications as directed.  [name] [name] reports that she is put on weight secondary to eating fast food and occasional smoothies.  Not watching exactly what she eats.  Reports that she is getting a well-balanced diet though and trying to get in her calcium and vitamin D and fruits and veggies.  Additionally reports she is not drinking any alcohol or smoking.  She reports that her last Pap was in May 2019.  Recently had a hysterectomy in the fall 2019.  Is doing much better with that.  Today she feels that she is having a lot of uncontrolled irritability and anxiety.  Reports that she has been on Prozac for a while but does not feel that it is helping her.  Reports that she has taken her Klonopin twice daily outside of what she has been directed.  As she she cannot handle her emotional status.  She endorses that her father had schizophrenia and her sister also has trouble with depression and anxiety.  She is  feeling this every day of the week.  She just wants to tell people to be quiet and to leave her alone she reports that she is having troubles holding onto her filter as well.  Additionally she reports that she has been spending money that she does not need to spend.  And her relationship she reports that she has been enjoying having sexual intercourse much more frequently than she had been in the past.  She is unsure if this is related to having a hysterectomy.  But she has noticed a change in her emotional and mental state.  Has previously been to Dr. Harrington Challenger and talk to someone virtually.  She reports that she just gets frustrated and very easily aggravated with hearing people talk, and hearing babies cry even though she loves children along with some other things.  Does not currently exercise on a regular basis or do anything that promote self-care.  Health maintenance wise she is up-to-date on her immunizations works for the [employer] system so she receives her flu shot through them.  Unsure when her last Tdap was possibly due in the next year.  Last Pap as previously stated above.  Does not currently need any of her screening test.  Recent labs demonstrated control.    In office today denies having any cough, shortness of breath, fever, chills or any other signs or symptoms of infection.  Has not been exposed to COVID at  this time.  Past Medical, Surgical, Social History, Allergies, and Medications have been Reviewed.  Review Of Systems  Review of Systems  Constitutional: Negative for activity change, appetite change, chills and fever.  HENT: Negative.   Eyes: Negative.  Negative for visual disturbance.  Respiratory: Negative for cough, chest tightness and shortness of breath.   Cardiovascular: Negative for chest pain, palpitations and leg swelling.  Gastrointestinal: Negative.   Endocrine: Negative for polydipsia, polyphagia and polyuria.  Genitourinary: Negative.   Musculoskeletal: Negative.     Skin: Negative.   Allergic/Immunologic: Negative for environmental allergies.  Neurological: Positive for headaches. Negative for dizziness.  Hematological: Negative.   Psychiatric/Behavioral: Negative for confusion and sleep disturbance. The patient is nervous/anxious.   All other systems reviewed and are negative.   Objective:   PHYSICAL EXAM:  BP (!) 131/93    Pulse 77    Temp 98.8 F (37.1 C) (Oral)    Resp 12    Ht 5' (1.524 m)    Wt 140 lb 1.9 oz (63.6 kg)    LMP 04/20/2018    SpO2 96%    BMI 27.37 kg/m   Depression Screening  Depression screen [facility] 2/9 02/19/2019 12/16/2018 05/09/2018 01/27/2018 01/27/2018  Decreased Interest 0 0 1 3 -  Down, Depressed, Hopeless 0 0 2 3 3   PHQ - 2 Score 0 0 3 6 3   Altered sleeping 1 - 2 2 2   Tired, decreased energy 1 - 1 2 3   Change in appetite 1 - 1 1 2   Feeling bad or failure about yourself  0 - 0 1 1  Trouble concentrating 3 - 0 0 3  Moving slowly or fidgety/restless 1 - 2 0 0  Suicidal thoughts 0 - 0 0 0  PHQ-9 Score 7 - 9 12 14   Difficult doing work/chores Somewhat difficult - - - -    GAD 7 : Generalized Anxiety Score 02/19/2019 01/27/2018 01/27/2018  Nervous, Anxious, on Edge 2 3 3   Control/stop worrying 2 3 3   Worry too much - different things 0 3 3  Trouble relaxing 0 2 3  Restless 3 1 1   Easily annoyed or irritable 3 2 3   Afraid - awful might happen 0 0 0  Total GAD 7 Score 10 14 16   Anxiety Difficulty Not difficult at all - -    Physical Exam Vitals signs and nursing note reviewed.  Constitutional:      Appearance: Normal appearance. She is well-developed, well-groomed and overweight.  HENT:     Head: Normocephalic and atraumatic.     Right Ear: Tympanic membrane and external ear normal.     Left Ear: Tympanic membrane and external ear normal.     Nose: Nose normal.  Eyes:     General:        Right eye: No discharge.     Extraocular Movements: Extraocular movements intact.     Conjunctiva/sclera: Conjunctivae normal.      Pupils: Pupils are equal, round, and reactive to light.     Comments: Wearing glasses  Neck:     Musculoskeletal: Normal range of motion and neck supple.  Cardiovascular:     Rate and Rhythm: Normal rate and regular rhythm.     Pulses: Normal pulses.          Radial pulses are 2+ on the right side and 2+ on the left side.       Dorsalis pedis pulses are 2+ on the right side and 2+ on the  left side.     Heart sounds: Normal heart sounds.  Pulmonary:     Effort: Pulmonary effort is normal.     Breath sounds: Normal breath sounds.  Abdominal:     General: Abdomen is flat. Bowel sounds are normal.     Palpations: Abdomen is soft.  Musculoskeletal: Normal range of motion.     Right lower leg: No edema.     Left lower leg: No edema.  Skin:    General: Skin is warm and dry.     Capillary Refill: Capillary refill takes less than 2 seconds.  Neurological:     General: No focal deficit present.     Mental Status: She is alert and oriented to person, place, and time. Mental status is at baseline.     Cranial Nerves: Cranial nerves are intact.     Sensory: Sensation is intact.     Motor: Motor function is intact.     Coordination: Coordination is intact.     Gait: Gait is intact.     Deep Tendon Reflexes: Reflexes are normal and symmetric.  Psychiatric:        Attention and Perception: Attention and perception normal.        Mood and Affect: Mood and affect normal.        Speech: Speech normal.        Behavior: Behavior normal. Behavior is cooperative.        Thought Content: Thought content normal.        Cognition and Memory: Cognition normal.        Judgment: Judgment normal.      Assessment & Plan:   1. Depression, recurrent ([place]) Deteriorated current PHQ 9 is 7, questionable as to whether or not she was fully forthcoming on some of the anxious.  Secondary to communication pattern in the office.  Demonstrated mild irritability in conversation and some anxiety.  Currently will  be looking to change her medications to see if we can help her with reduction in anxiety and irritability.  She is willing to go back to Dr. Harrington Challenger for further evaluation as she seems to be a little bit worried of the fact that her father had schizophrenia.  2. Generalized anxiety disorder Deteriorated has ongoing irritability and anxiety.  We will be changing her from Prozac that she has been on for several years now she is unsure how long and starting low-dose Cymbalta and will increase to 60 mg from 30 after a week or 2 if she feels that the 30 is not helping her.  We will be following up with her in 4 weeks regarding this.  - DULoxetine HCl 30 MG CSDR; Take 30 mg by mouth daily. 30 mg by mouth daily for first week, then increase to 60 mg by mouth tablets  Dispense: 30 capsule; Refill: 1  3. Healthcare maintenance Currently up-to-date on all immunizations and lab work.  She had lab work drawn through her work.  She does not need to have a Pap smear at this time either.  She reports that she is going to be seeing an eye doctor and a dentist here soon.  Vision today in the office was 20 2020.  With glasses. Discussed need for monthly self breast exams and yearly mammograms; at least 30 minutes of aerobic activity at least 5 days/week and weight-bearing exercise 2x/week; proper sunscreen use reviewed; healthy diet, including goals of calcium and vitamin D intake and alcohol recommendations (less than or equal to  1 drink/day) reviewed; regular seatbelt use; changing batteries in smoke detectors and carbon monoxide detectors.  Immunization recommendations discussed.  Colonoscopy recommendations reviewed.  Advised not to use her phone additionally while driving.  The patient's weight, height, BMI, and visual acuity have been recorded in the chart.  I have made referrals, counseling, and provided education to the patient based on review of the above and I have provided the patient with a written personalized  care plan for preventive services.   4. Hyperlipidemia LDL goal <100 Most recent lab work demonstrated elevation in HDL 86.  LDL 97.  Is currently on low-dose Crestor.  Denies having any myopathies with it we will continue this at this time.  5. Vitamin D deficiency Unsure of what her current level of vitamin D is as this was not drawn with her lab work.  We will be looking to try to order this in the near future as she is not currently taking a second medication for this.  But has a history of having low vitamin D.  6. Gastroesophageal reflux disease, esophagitis presence not specified Controlled, is on Prevacid does not have any symptoms will continue this medication at this time.  7. Allergic rhinitis, unspecified seasonality, unspecified trigger Controlled currently on Singulair will continue this medication at this time.  8.  Overweight BMI 25.0-29.9 Uncontrolled, currently reports that she is not eating a well-balanced diet.  Strongly encouraged to make sure that she reduces the amount of fast food that she intakes.  Along with trying to do self-care implementation with exercise.  Follow-up: 4 weeks  Perlie Mayo, DNP, AGNP-BC [address], [address] [place], [place] [address] Office Hours: Mon-Thurs 8 am-5 pm; Fri 8 am-12 pm Office Phone:  [phone]  Office Fax: [phone]    Perlie Mayo, NP   02/19/2019

## 2019-02-23 ENCOUNTER — Telehealth: Payer: Self-pay | Admitting: Adult Health

## 2019-02-23 ENCOUNTER — Telehealth: Payer: Self-pay | Admitting: Family Medicine

## 2019-02-23 MED ORDER — METRONIDAZOLE 500 MG PO TABS: 500.0000 mg | ORAL_TABLET | Freq: Two times a day (BID) | ORAL | 2 refills | Status: DC

## 2019-02-23 NOTE — Telephone Encounter (Signed)
Will refill flagyl

## 2019-02-23 NOTE — Telephone Encounter (Addendum)
Pt has a vaginal discharge with fishy smell. Pt always gets these symptoms when it gets hot. Pt is requesting something for BV. Can you put refills on it? Thanks!! [place]

## 2019-02-23 NOTE — Telephone Encounter (Signed)
Can you call something in for BV she always get when it is hot she uses [facility] on [address]

## 2019-02-23 NOTE — Telephone Encounter (Signed)
Pt states that [name] [name] changed her to Cymbalta, and she was reading and it states that would run your BP up, does she need to be on BP medication since her BP runs high?

## 2019-02-23 NOTE — Addendum Note (Signed)
Addended by: Derrek Monaco A on: 02/23/2019 01:45 PM   Modules accepted: Orders

## 2019-02-24 NOTE — Telephone Encounter (Signed)
If BP not high, no need to take medication,  When re  Evaluated on the Cymbalta will see if BP elevated and address then

## 2019-02-24 NOTE — Telephone Encounter (Signed)
Spoke with patient and advised her of treatment plan with verbal understanding.

## 2019-02-25 ENCOUNTER — Other Ambulatory Visit: Payer: Self-pay | Admitting: Family Medicine

## 2019-03-02 ENCOUNTER — Other Ambulatory Visit: Payer: Self-pay

## 2019-03-02 ENCOUNTER — Encounter: Payer: Self-pay | Admitting: Family Medicine

## 2019-03-02 ENCOUNTER — Telehealth (INDEPENDENT_AMBULATORY_CARE_PROVIDER_SITE_OTHER): Payer: Commercial Managed Care - PPO | Admitting: Family Medicine

## 2019-03-02 VITALS — BP 131/93 | Ht 60.0 in | Wt 140.0 lb

## 2019-03-02 DIAGNOSIS — J0100 Acute maxillary sinusitis, unspecified: Secondary | ICD-10-CM

## 2019-03-02 DIAGNOSIS — F411 Generalized anxiety disorder: Secondary | ICD-10-CM

## 2019-03-02 DIAGNOSIS — J309 Allergic rhinitis, unspecified: Secondary | ICD-10-CM

## 2019-03-02 DIAGNOSIS — H9201 Otalgia, right ear: Secondary | ICD-10-CM

## 2019-03-02 MED ORDER — PREDNISONE 20 MG PO TABS: ORAL_TABLET | ORAL | 0 refills | Status: DC

## 2019-03-02 MED ORDER — CHLORPHENIRAMINE MALEATE 4 MG PO TABS: 4.0000 mg | ORAL_TABLET | Freq: Two times a day (BID) | ORAL | 0 refills | Status: DC | PRN

## 2019-03-02 MED ORDER — FLUCONAZOLE 150 MG PO TABS: ORAL_TABLET | ORAL | 0 refills | Status: DC

## 2019-03-02 NOTE — Assessment & Plan Note (Addendum)
3 day h/o right pain and pressure and left ear pain, prednisone short term, continue netty pot and add chlorpheniramine short term No symptoms of infection, if they develop in 2 weeks pt to have antimitotic prescribed

## 2019-03-02 NOTE — Patient Instructions (Signed)
F/U as before, call if you need me sooner  You are treated for uncontrolled allergy symptoms , causing pressure and pain over right cheek and ear  You do not have symptoms of infection currently, but as discussed, if these develop, in the next 2 weeks, pls reach out , I will prescribe antibiotics at that time  Continue daily netty pot and Singulair, and ensure you drink water often and in adequate mounts, at least 64 ounces daily.  Thanks for choosing [facility] [place], we consider it a privelige to serve you.

## 2019-03-02 NOTE — Progress Notes (Signed)
Virtual Visit via Telephone Note  I connected with [name] [name] on 03/02/19 at 11:00 AM EDT by telephone and verified that I am speaking with the correct person using two identifiers.  Location: Patient: work Provider: office   I discussed the limitations, risks, security and privacy concerns of performing an evaluation and management service by telephone and the availability of in person appointments. I also discussed with the patient that there may be a patient responsible charge related to this service. The patient expressed understanding and agreed to proceed.   History of Present Illness: Sinus pressure on right cheek and right ear , rated at an 8 , throbbing in ear , no fever or chills , started this past Saturday, all drainage is clear,  using netty pot C/o poorly controled depression and anxiety, now taking new medication  Denies recent fever or chills. . Denies chest congestion, productive cough or wheezing. Denies chest pains, palpitations and leg swelling Denies abdominal pain, nausea, vomiting,diarrhea or constipation.   Denies dysuria, frequency, hesitancy or incontinence. Denies joint pain, swelling and limitation in mobility. Denies headaches, seizures, numbness, or tingling. Denies skin break down or rash.       Observations/Objective: BP (!) 131/93   Ht 5' (1.524 m)   Wt 140 lb (63.5 kg)   LMP 04/20/2018   BMI 27.34 kg/m  Good communication with no confusion and intact memory. Alert and oriented x 3 No signs of respiratory distress during speech    Assessment and Plan:  Maxillary sinusitis 3 day h/o right pain and pressure and left ear pain, prednisone short term, continue netty pot and add chlorpheniramine short term No symptoms of infection, if they develop in 2 weeks pt to have antimitotic prescribed  Allergic rhinitis Uncontrolled currently and asymptomatic, short course of prednisone os prescribed   Generalized anxiety disorder Poorly  controlled , pt to keep f/u appt with Percell Boston as before   Follow Up Instructions:    I discussed the assessment and treatment plan with the patient. The patient was provided an opportunity to ask questions and all were answered. The patient agreed with the plan and demonstrated an understanding of the instructions.   The patient was advised to call back or seek an in-person evaluation if the symptoms worsen or if the condition fails to improve as anticipated.  I provided 15 minutes of non-face-to-face time during this encounter.   Tula Nakayama, MD

## 2019-03-02 NOTE — Assessment & Plan Note (Signed)
Poorly controlled , pt to keep f/u appt with Percell Boston as before

## 2019-03-02 NOTE — Assessment & Plan Note (Signed)
Uncontrolled currently and asymptomatic, short course of prednisone os prescribed

## 2019-03-03 ENCOUNTER — Encounter: Payer: Self-pay | Admitting: Family Medicine

## 2019-03-03 ENCOUNTER — Telehealth: Payer: Self-pay

## 2019-03-03 NOTE — Telephone Encounter (Signed)
LeighAnn Ajna Moors, CMA  

## 2019-03-03 NOTE — Telephone Encounter (Signed)
Message was sent back to Ms Rachel

## 2019-03-04 ENCOUNTER — Other Ambulatory Visit: Payer: Self-pay | Admitting: Family Medicine

## 2019-03-04 ENCOUNTER — Encounter: Payer: Self-pay | Admitting: Family Medicine

## 2019-03-04 DIAGNOSIS — F339 Major depressive disorder, recurrent, unspecified: Secondary | ICD-10-CM

## 2019-03-04 DIAGNOSIS — F411 Generalized anxiety disorder: Secondary | ICD-10-CM

## 2019-03-04 MED ORDER — CLONAZEPAM 1 MG PO TABS: ORAL_TABLET | ORAL | 0 refills | Status: DC

## 2019-03-04 MED ORDER — BUSPIRONE HCL 5 MG PO TABS: ORAL_TABLET | ORAL | 5 refills | Status: DC

## 2019-03-05 ENCOUNTER — Other Ambulatory Visit: Payer: Self-pay | Admitting: Family Medicine

## 2019-03-05 MED ORDER — AZITHROMYCIN 250 MG PO TABS: ORAL_TABLET | ORAL | 0 refills | Status: DC

## 2019-03-05 NOTE — Progress Notes (Signed)
azithro

## 2019-03-17 ENCOUNTER — Encounter: Payer: Self-pay | Admitting: Family Medicine

## 2019-03-17 ENCOUNTER — Telehealth: Payer: Self-pay | Admitting: Family Medicine

## 2019-03-17 ENCOUNTER — Other Ambulatory Visit: Payer: Self-pay | Admitting: Family Medicine

## 2019-03-17 DIAGNOSIS — F411 Generalized anxiety disorder: Secondary | ICD-10-CM

## 2019-03-17 MED ORDER — DULOXETINE HCL 60 MG PO CSDR: 60.0000 mg | DELAYED_RELEASE_CAPSULE | Freq: Every day | ORAL | 3 refills | Status: DC

## 2019-03-17 NOTE — Telephone Encounter (Signed)
Needs to confirm RX that was sent please call

## 2019-03-18 ENCOUNTER — Ambulatory Visit: Payer: Commercial Managed Care - PPO | Admitting: Family Medicine

## 2019-03-19 ENCOUNTER — Ambulatory Visit: Payer: Commercial Managed Care - PPO | Admitting: Family Medicine

## 2019-03-24 NOTE — Telephone Encounter (Signed)
Unable to get anyone on the phone

## 2019-04-01 ENCOUNTER — Other Ambulatory Visit: Payer: Self-pay | Admitting: Family Medicine

## 2019-04-01 ENCOUNTER — Telehealth: Payer: Self-pay

## 2019-04-01 NOTE — Addendum Note (Signed)
Addended by: Perlie Mayo on: 04/01/2019 03:13 PM   Modules accepted: Level of Service

## 2019-04-01 NOTE — Telephone Encounter (Signed)
Patient called and said her visit 02/19/19 should have been a yearly physical.  She was charged [phone].  Jarrett Soho corrected the Epic chart today and corrected the billing.  I called the patient and she is aware this is changed in Epic.  I am sending a email to billing today regarding this correction for the claim to be re filed.

## 2019-04-09 ENCOUNTER — Ambulatory Visit: Payer: Commercial Managed Care - PPO | Admitting: Family Medicine

## 2019-05-06 ENCOUNTER — Other Ambulatory Visit: Payer: Self-pay

## 2019-05-06 ENCOUNTER — Telehealth: Payer: Self-pay | Admitting: *Deleted

## 2019-05-06 DIAGNOSIS — F411 Generalized anxiety disorder: Secondary | ICD-10-CM

## 2019-05-06 MED ORDER — DULOXETINE HCL 60 MG PO CSDR: 60.0000 mg | DELAYED_RELEASE_CAPSULE | Freq: Every day | ORAL | 3 refills | Status: DC

## 2019-05-06 NOTE — Telephone Encounter (Signed)
This has been sent in earlier today

## 2019-05-06 NOTE — Telephone Encounter (Signed)
Pt called said she is out of her cymbalta. She has been without the medication for 4 days and needs this called in to [facility] [address]

## 2019-05-07 ENCOUNTER — Other Ambulatory Visit: Payer: Self-pay

## 2019-05-07 MED ORDER — DULOXETINE HCL 60 MG PO CPEP: 60.0000 mg | ORAL_CAPSULE | Freq: Every day | ORAL | 5 refills | Status: DC

## 2019-05-07 NOTE — Telephone Encounter (Signed)
Pt aware, med sent again

## 2019-05-07 NOTE — Telephone Encounter (Signed)
Pt called back said she has been without the medication for 5 days she just called [facility] [address] and they still do not have the medication. Would like a call back

## 2019-05-18 ENCOUNTER — Telehealth: Payer: Self-pay | Admitting: Family Medicine

## 2019-05-18 NOTE — Telephone Encounter (Signed)
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

## 2019-05-28 ENCOUNTER — Encounter: Payer: Self-pay | Admitting: Family Medicine

## 2019-05-28 ENCOUNTER — Ambulatory Visit (INDEPENDENT_AMBULATORY_CARE_PROVIDER_SITE_OTHER): Payer: Commercial Managed Care - PPO | Admitting: Family Medicine

## 2019-05-28 ENCOUNTER — Telehealth: Payer: Self-pay | Admitting: *Deleted

## 2019-05-28 ENCOUNTER — Other Ambulatory Visit: Payer: Self-pay

## 2019-05-28 VITALS — BP 131/93 | Ht 60.0 in | Wt 140.0 lb

## 2019-05-28 DIAGNOSIS — F411 Generalized anxiety disorder: Secondary | ICD-10-CM | POA: Diagnosis not present

## 2019-05-28 DIAGNOSIS — J029 Acute pharyngitis, unspecified: Secondary | ICD-10-CM | POA: Diagnosis not present

## 2019-05-28 DIAGNOSIS — R05 Cough: Secondary | ICD-10-CM

## 2019-05-28 DIAGNOSIS — J0100 Acute maxillary sinusitis, unspecified: Secondary | ICD-10-CM

## 2019-05-28 DIAGNOSIS — R058 Other specified cough: Secondary | ICD-10-CM

## 2019-05-28 DIAGNOSIS — R030 Elevated blood-pressure reading, without diagnosis of hypertension: Secondary | ICD-10-CM

## 2019-05-28 MED ORDER — PROMETHAZINE-DM 6.25-15 MG/5ML PO SYRP: ORAL_SOLUTION | ORAL | 0 refills | Status: DC

## 2019-05-28 MED ORDER — FIRST-DUKES MOUTHWASH MT SUSP: OROMUCOSAL | 0 refills | Status: DC

## 2019-05-28 MED ORDER — AZITHROMYCIN 250 MG PO TABS: ORAL_TABLET | ORAL | 0 refills | Status: DC

## 2019-05-28 MED ORDER — FLUCONAZOLE 150 MG PO TABS: ORAL_TABLET | ORAL | 0 refills | Status: DC

## 2019-05-28 NOTE — Patient Instructions (Signed)
F/U in office with MD in October in the next 2 to 4 weeks, pt has vacation  Days in this month and wants appt during this month  You are treated for sinusitis, 4 medications are prescribed as discussed  It is important that you exercise regularly at least 30 minutes 5 times a week. If you develop chest pain, have severe difficulty breathing, or feel very tired, stop exercising immediately and seek medical attention  Think about what you will eat, plan ahead. Choose " clean, green, fresh or frozen" over canned, processed or packaged foods which are more sugary, salty and fatty. 70 to 75% of food eaten should be vegetables and fruit. Three meals at set times with snacks allowed between meals, but they must be fruit or vegetables. Aim to eat over a 12 hour period , example 7 am to 7 pm, and STOP after  your last meal of the day. Drink water,generally about 64 ounces per day, no other drink is as healthy. Fruit juice is best enjoyed in a healthy way, by EATING the fruit. Thanks for choosing [facility], we consider it a privelige to serve you.

## 2019-05-28 NOTE — Telephone Encounter (Signed)
Please advise 

## 2019-05-28 NOTE — Telephone Encounter (Signed)
Yes dukes mouthwash was called in, pls send the strep test to [facility], dx pharyngitis as she has requested, thank you

## 2019-05-28 NOTE — Telephone Encounter (Signed)
Pt said she did a phone visit this morning. She wanted to know if a strep test could be ordered and faxed to [id number] which is the lab at [facility]. This is where she works and she can get it done here. Also she said her throat is on fire and was something called in to help this.

## 2019-05-28 NOTE — Progress Notes (Signed)
Virtual Visit via Telephone Note  I connected with [name] [name] on 05/28/19 at  8:00 AM EDT by telephone and verified that I am speaking with the correct person using two identifiers.  Location: Patient: work Provider: office    I discussed the limitations, risks, security and privacy concerns of performing an evaluation and management service by telephone and the availability of in person appointments. I also discussed with the patient that there may be a patient responsible charge related to this service. The patient expressed understanding and agreed to proceed.   History of Present Illness: Raw sore throat x 4 days, no fever or chills. Dry cough Increased post nasal drainage, thick and green, worse at night causing her to cough .c/o facial pain over  Cheeks and under eyes Denies chest congestion, productive cough or wheezing. Denies chest pains, palpitations and leg swelling Denies abdominal pain, nausea, vomiting,diarrhea or constipation.   Denies dysuria, frequency, hesitancy or incontinence. Denies joint pain, swelling and limitation in mobility.  Denies depression, or uncontrolled anxiety       Observations/Objective: BP (!) 131/93   Ht 5' (1.524 m)   Wt 140 lb (63.5 kg)   LMP 04/20/2018   BMI 27.34 kg/m  Good communication with no confusion and intact memory. Alert and oriented x 3 No signs of respiratory distress during speech    Assessment and Plan: Maxillary sinusitis Antibiotic ,azithromycin and  fluconazole and  prescribed. 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 Dukes mouthwash prescribed for symptom  Dry cough Phenergan DM as needed , for bedime use only  Generalized anxiety disorder Controlled, no change in medication   Elevated blood pressure reading without diagnosis of  hypertension DASH diet and commitment to daily physical activity for a minimum of 30 minutes discussed and encouraged, as a part of hypertension management. The importance of attaining a healthy weight is also discussed.  BP/Weight 05/28/2019 03/02/2019 02/19/2019 12/16/2018 07/15/2018 06/24/2018 [id number]  Systolic BP A999333 A999333 A999333 123456 0000000 123456 0000000  Diastolic BP 93 93 93 80 87 85 74  Wt. (Lbs) 140 140 140.12 129 129.5 125 123  BMI 27.34 27.34 27.37 25.19 25.29 24.41 24.02  Some encounter information is confidential and restricted. Go to Review Flowsheets activity to see all data.  office eval needed to further assess       Follow Up Instructions:    I discussed the assessment and treatment plan with the patient. The patient was provided an opportunity to ask questions and all were answered. The patient agreed with the plan and demonstrated an understanding of the instructions.   The patient was advised to call back or seek an in-person evaluation if the symptoms worsen or if the condition fails to improve as anticipated.  I provided 15 minutes of non-face-to-face time during this encounter.   Tula Nakayama, MD

## 2019-05-29 ENCOUNTER — Encounter: Payer: Self-pay | Admitting: Family Medicine

## 2019-05-29 ENCOUNTER — Telehealth: Payer: Self-pay

## 2019-05-29 DIAGNOSIS — R05 Cough: Secondary | ICD-10-CM | POA: Insufficient documentation

## 2019-05-29 DIAGNOSIS — J029 Acute pharyngitis, unspecified: Secondary | ICD-10-CM | POA: Insufficient documentation

## 2019-05-29 DIAGNOSIS — R058 Other specified cough: Secondary | ICD-10-CM | POA: Insufficient documentation

## 2019-05-29 DIAGNOSIS — R030 Elevated blood-pressure reading, without diagnosis of hypertension: Secondary | ICD-10-CM | POA: Insufficient documentation

## 2019-05-29 NOTE — Telephone Encounter (Signed)
Strep test ordered and faxed

## 2019-05-29 NOTE — Assessment & Plan Note (Signed)
Phenergan DM as needed , for bedime use only

## 2019-05-29 NOTE — Assessment & Plan Note (Signed)
DASH diet and commitment to daily physical activity for a minimum of 30 minutes discussed and encouraged, as a part of hypertension management. The importance of attaining a healthy weight is also discussed.  BP/Weight 05/28/2019 03/02/2019 02/19/2019 12/16/2018 07/15/2018 06/24/2018 [id number]  Systolic BP A999333 A999333 A999333 123456 0000000 123456 0000000  Diastolic BP 93 93 93 80 87 85 74  Wt. (Lbs) 140 140 140.12 129 129.5 125 123  BMI 27.34 27.34 27.37 25.19 25.29 24.41 24.02  Some encounter information is confidential and restricted. Go to Review Flowsheets activity to see all data.  office eval needed to further assess

## 2019-05-29 NOTE — Assessment & Plan Note (Signed)
Controlled, no change in medication  

## 2019-05-29 NOTE — Assessment & Plan Note (Signed)
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 Dukes mouthwash prescribed for symptom

## 2019-05-29 NOTE — Telephone Encounter (Signed)
Advised patient that mouthwash has been called in and test has been faxed

## 2019-05-29 NOTE — Assessment & Plan Note (Signed)
Antibiotic ,azithromycin and  fluconazole and  prescribed. Pt encouraged to increase fluid intake

## 2019-06-09 ENCOUNTER — Other Ambulatory Visit: Payer: Self-pay

## 2019-06-09 ENCOUNTER — Encounter: Payer: Self-pay | Admitting: Family Medicine

## 2019-06-09 ENCOUNTER — Ambulatory Visit (INDEPENDENT_AMBULATORY_CARE_PROVIDER_SITE_OTHER): Payer: Commercial Managed Care - PPO | Admitting: Family Medicine

## 2019-06-09 VITALS — BP 140/94 | Temp 97.1°F | Ht 60.0 in | Wt 142.0 lb

## 2019-06-09 DIAGNOSIS — M25521 Pain in right elbow: Secondary | ICD-10-CM | POA: Diagnosis not present

## 2019-06-09 DIAGNOSIS — F411 Generalized anxiety disorder: Secondary | ICD-10-CM

## 2019-06-09 DIAGNOSIS — H60542 Acute eczematoid otitis externa, left ear: Secondary | ICD-10-CM

## 2019-06-09 DIAGNOSIS — J0100 Acute maxillary sinusitis, unspecified: Secondary | ICD-10-CM

## 2019-06-09 DIAGNOSIS — Z23 Encounter for immunization: Secondary | ICD-10-CM

## 2019-06-09 DIAGNOSIS — I10 Essential (primary) hypertension: Secondary | ICD-10-CM

## 2019-06-09 DIAGNOSIS — F324 Major depressive disorder, single episode, in partial remission: Secondary | ICD-10-CM

## 2019-06-09 DIAGNOSIS — F5104 Psychophysiologic insomnia: Secondary | ICD-10-CM

## 2019-06-09 MED ORDER — FLUCONAZOLE 150 MG PO TABS: ORAL_TABLET | ORAL | 0 refills | Status: DC

## 2019-06-09 MED ORDER — AMLODIPINE BESYLATE 2.5 MG PO TABS: 2.5000 mg | ORAL_TABLET | Freq: Every day | ORAL | 1 refills | Status: DC

## 2019-06-09 MED ORDER — SULFAMETHOXAZOLE-TRIMETHOPRIM 800-160 MG PO TABS: 1.0000 | ORAL_TABLET | Freq: Two times a day (BID) | ORAL | 0 refills | Status: DC

## 2019-06-09 MED ORDER — HYDROCORTISONE-ACETIC ACID 1-2 % OT SOLN: 3.0000 [drp] | Freq: Two times a day (BID) | OTIC | 0 refills | Status: AC

## 2019-06-09 NOTE — Patient Instructions (Addendum)
F/U I office with MD , re eval BP first week in January, call if you need me before  Amlodipine is prescribed for your blood pressure , take at the same time every evening  For itchy ear, ear drop is prescribed, use for 1 week , then only if needed  Additional antibiotic course , Septra and fluconazole are prescribe for sinus infection  You are referred to Dr Aline Brochure re right elbow  Thanks for choosing [facility], we consider it a privelige to serve you.

## 2019-06-09 NOTE — Assessment & Plan Note (Addendum)
Continued pain and pressure over right maxillary and ethmoid sinuses additional course of antibiotic prescribed

## 2019-06-13 ENCOUNTER — Encounter: Payer: Self-pay | Admitting: Family Medicine

## 2019-06-13 DIAGNOSIS — H60509 Unspecified acute noninfective otitis externa, unspecified ear: Secondary | ICD-10-CM | POA: Insufficient documentation

## 2019-06-13 DIAGNOSIS — F324 Major depressive disorder, single episode, in partial remission: Secondary | ICD-10-CM | POA: Insufficient documentation

## 2019-06-13 NOTE — Assessment & Plan Note (Signed)
Continue cymbalta as before, controlled

## 2019-06-13 NOTE — Assessment & Plan Note (Signed)
Controlled, no change in medication  

## 2019-06-13 NOTE — Assessment & Plan Note (Signed)
Left otitis externa, topical steroid x 1 week , then as needed

## 2019-06-13 NOTE — Assessment & Plan Note (Signed)
Uncontrolled, needs to start medication and she is commiting to this at this visit DASH diet and commitment to daily physical activity for a minimum of 30 minutes discussed and encouraged, as a part of hypertension management. The importance of attaining a healthy weight is also discussed.  BP/Weight 06/09/2019 05/28/2019 03/02/2019 02/19/2019 12/16/2018 07/15/2018 0000000  Systolic BP [id number] A999333 A999333 A999333 123456 0000000 123456  Diastolic BP 94 93 93 93 80 87 85  Wt. (Lbs) 142 140 140 140.12 129 129.5 125  BMI 27.73 27.34 27.34 27.37 25.19 25.29 24.41  Some encounter information is confidential and restricted. Go to Review Flowsheets activity to see all data.

## 2019-06-13 NOTE — Progress Notes (Signed)
   [name] [name]     MRN: [id number]      DOB: 1970-12-03   HPI [name] [name] is herewith a c/o left ear and throat pain, was recently treated for sinus infection but feels that this has not completely resolved, also c/o itchy ear on the left C/o right elbow swelling which is tender , following direct trauma 1 month ago, this also limits mobility of the joint  ROS Denies recent fever or chills.  Denies chest congestion, productive cough or wheezing. Denies chest pains, palpitations and leg swelling Denies abdominal pain, nausea, vomiting,diarrhea or constipation.   Denies dysuria, frequency, hesitancy or incontinence. . Denies headaches, seizures, numbness, or tingling. Denies uncontrolled  depression, anxiety or insomnia. Denies skin break down or rash.   PE  BP (!) 140/94   Temp (!) 97.1 F (36.2 C)   Ht 5' (1.524 m)   Wt 142 lb (64.4 kg)   LMP 04/20/2018   BMI 27.73 kg/m   Patient alert and oriented and in no cardiopulmonary distress.  HEENT: No facial asymmetry, EOMI,     Neck supple .Right maxillary and ethmoid sinuses are tender, Both  TM are clear with good light reflex  Chest: Clear to auscultation bilaterally.  CVS: S1, S2 no murmurs, no S3.Regular rate.  ABD: Soft non tender.   Ext: No edema  MS: Adequate ROM spine, shoulders, hips and knees.  Skin: Intact, no ulcerations or rash noted.  Psych: Good eye contact, normal affect. Memory intact not anxious or depressed appearing.  CNS: CN 2-12 intact, power,  normal throughout.no focal deficits noted.   Assessment & Plan  Maxillary sinusitis Continued pain and pressure over right maxillary and ethmoid sinuses additional course of antibiotic prescribed  Benign essential HTN Uncontrolled, needs to start medication and she is commiting to this at this visit DASH diet and commitment to daily physical activity for a minimum of 30 minutes discussed and encouraged, as a part of hypertension management.  The importance of attaining a healthy weight is also discussed.  BP/Weight 06/09/2019 05/28/2019 03/02/2019 02/19/2019 12/16/2018 07/15/2018 0000000  Systolic BP [id number] A999333 A999333 A999333 123456 0000000 123456  Diastolic BP 94 93 93 93 80 87 85  Wt. (Lbs) 142 140 140 140.12 129 129.5 125  BMI 27.73 27.34 27.34 27.37 25.19 25.29 24.41  Some encounter information is confidential and restricted. Go to Review Flowsheets activity to see all data.       Elbow pain, right 1 month history following direct trauma with tender swelling, refer to ortho  Otitis externa, acute noninfectious Left otitis externa, topical steroid x 1 week , then as needed  Generalized anxiety disorder Controlled, no change in medication   Depression, major, single episode, in partial remission ([place]) Continue cymbalta as before, controlled

## 2019-06-13 NOTE — Assessment & Plan Note (Signed)
1 month history following direct trauma with tender swelling, refer to ortho

## 2019-07-07 ENCOUNTER — Telehealth: Payer: Self-pay | Admitting: *Deleted

## 2019-07-07 ENCOUNTER — Other Ambulatory Visit: Payer: Self-pay

## 2019-07-07 MED ORDER — ROSUVASTATIN CALCIUM 10 MG PO TABS: ORAL_TABLET | ORAL | 4 refills | Status: DC

## 2019-07-07 NOTE — Telephone Encounter (Signed)
Pt called needing her crestor sent in to [facility] on [address] she is out of the medication

## 2019-07-07 NOTE — Telephone Encounter (Signed)
This was refilled today

## 2019-07-08 ENCOUNTER — Encounter: Payer: Self-pay | Admitting: Orthopedic Surgery

## 2019-07-08 ENCOUNTER — Ambulatory Visit: Payer: Commercial Managed Care - PPO

## 2019-07-08 ENCOUNTER — Other Ambulatory Visit: Payer: Self-pay

## 2019-07-08 ENCOUNTER — Ambulatory Visit: Payer: Commercial Managed Care - PPO | Admitting: Orthopedic Surgery

## 2019-07-08 VITALS — BP 147/114 | HR 93 | Ht 60.0 in | Wt 138.0 lb

## 2019-07-08 DIAGNOSIS — M7711 Lateral epicondylitis, right elbow: Secondary | ICD-10-CM | POA: Diagnosis not present

## 2019-07-08 DIAGNOSIS — M25521 Pain in right elbow: Secondary | ICD-10-CM

## 2019-07-08 MED ORDER — PREDNISONE 10 MG PO TABS: 10.0000 mg | ORAL_TABLET | Freq: Three times a day (TID) | ORAL | 1 refills | Status: DC

## 2019-07-08 NOTE — Progress Notes (Signed)
[name] [name]  07/08/2019  HISTORY SECTION :  Chief Complaint  Patient presents with  . 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   Review of Systems  Constitutional: Negative for fever.  Respiratory: Negative for shortness of breath.   Cardiovascular: Negative for chest pain.  Musculoskeletal: Positive for joint pain.     has a past medical history of Abnormal vaginal Pap smear, Allergic rhinitis, BV (bacterial vaginosis) (02/09/2015), Depression, recurrent ([place]) (07/31/2013), Fibroids (10/28/2013), Hyperlipidemia, Hypertension, Irregular intermenstrual bleeding (10/28/2013), Kidney stones, Mental disorder, Unspecified symptom associated with female genital organs (10/28/2013), Vaginal discharge (02/09/2015), Vaginal odor (02/09/2015), and Vaginal Pap smear, abnormal.   Past Surgical History:  Procedure Laterality Date  . ABDOMINAL HYSTERECTOMY N/A 06/04/2018   Procedure: TOTAL ABDOMINAL HYSTERECTOMY WITH LEFT OOPHORECTOMY;  Surgeon: Florian Buff, MD;  Location: [facility];  Service: Gynecology;  Laterality: N/A;  . BREAST REDUCTION SURGERY  Jan 2007  . CERVICAL BIOPSY  2010   for abnormal pap   . cyst removed from both breast , all benign both breast    . DILITATION & CURRETTAGE/HYSTROSCOPY WITH THERMACHOICE ABLATION N/A 12/15/2013   Procedure: DILATATION & CURETTAGE/HYSTEROSCOPY WITH THERMACHOICE ABLATION;  Surgeon: Jonnie Kind, MD;  Location: [facility];  Service: Gynecology;  Laterality: N/A;  total therapy time:43min 1sec D5W  22ml in, D5W  69ml out Temperature 87 degree c.  . EXCISION OF SKIN TAG Right 12/15/2013   Procedure: EXCISION OF SKIN TAG;  Surgeon: Jonnie Kind, MD;   Location: [facility];  Service: Gynecology;  Laterality: Right;  . LAPAROSCOPIC BILATERAL SALPINGECTOMY Bilateral 12/15/2013   Procedure: LAPAROSCOPIC BILATERAL SALPINGECTOMY;  Surgeon: Jonnie Kind, MD;  Location: [facility];  Service: Gynecology;  Laterality: Bilateral;  . REMOVAL OF NON VAGINAL CONTRACEPTIVE DEVICE Left 12/15/2013   Procedure: REMOVAL OF NON VAGINAL CONTRACEPTIVE [place];  Surgeon: Jonnie Kind, MD;  Location: [facility];  Service: Gynecology;  Laterality: Left;    Body mass index is 26.95 kg/m.   No Known Allergies   Current Outpatient Medications:  .  acetic acid-hydrocortisone (VOSOL-HC) OTIC solution, 2 (two) times daily., Disp: , Rfl:  .  amLODipine (NORVASC) 2.5 MG tablet, Take 1 tablet (2.5 mg total) by mouth daily., Disp: 90 tablet, Rfl: 1 .  aspirin-acetaminophen-caffeine (EXCEDRIN MIGRAINE) 250-250-65 MG tablet, Take 1 tablet by mouth daily as needed for headache., Disp: , Rfl:  .  busPIRone (BUSPAR) 5 MG tablet, TAKE 1 TABLET(5 MG) BY MOUTH TWICE DAILY, Disp: 60 tablet, Rfl: 5 .  clonazePAM (KLONOPIN) 1 MG tablet, Take 1 mg by mouth 2 (two) times daily., Disp: , Rfl:  .  DULoxetine (CYMBALTA) 60 MG capsule, Take 1 capsule (60 mg total) by mouth daily., Disp: 30 capsule, Rfl: 5 .  lansoprazole (PREVACID) 30 MG capsule, Take 30 mg by mouth 2 (two) times daily before a meal. Patient prefers this one and didn't want to take Protonix, Disp: , Rfl:  .  montelukast (SINGULAIR) 10 MG tablet, Take 1 tablet (10 mg total) by mouth at bedtime., Disp: 30 tablet, Rfl: 3 .  rosuvastatin (CRESTOR) 10 MG tablet, TAKE 1 TABLET(10 MG) BY MOUTH DAILY, Disp: 30 tablet,  Rfl: 4   PHYSICAL EXAM SECTION: 1) BP (!) 147/114   Pulse 93   Ht 5' (1.524 m)   Wt 138 lb (62.6 kg)   LMP 04/20/2018   BMI 26.95 kg/m   Body mass index is 26.95 kg/m. General appearance: Well-developed well-nourished no gross deformities  2) Cardiovascular normal pulse and perfusion in the upper  extremities normal color without edema  3) Neurologically deep tendon reflexes are equal and normal, no sensation loss or deficits no pathologic reflexes  4) Psychological: Awake alert and oriented x3 mood and affect normal  5) Skin no lacerations or ulcerations no nodularity no palpable masses, no erythema or nodularity  6) Musculoskeletal:   Right elbow tenderness slight swelling Skin is intact no rash Range of motion is normal Ligaments are stable Flexion extension strength is normal Provocative tests include wrist extension against resistance positive long finger extension positive     MEDICAL DECISION SECTION:  Encounter Diagnoses  Name Primary?  . Pain in right elbow   . Right tennis elbow Yes    Imaging Normal x-ray  Plan:  (Rx., Inj., surg., Frx, MRI/CT, XR:2)  Continue to ice the elbow daily  Wear the tennis elbow brace at work  Adjust the work environment  Take prednisone 10 mg 3 times a day  Make sure you do your tennis elbow exercises for the next 6 weeks  Procedure note injection for right tennis elbow   Diagnosis right tennis elbow  Anesthesia ethyl chloride was used Alcohol use is clean the skin  After we obtained verbal consent and timeout a 25-gauge needle was used to inject 40 mg of Depo-Medrol and 3 cc of 1% lidocaine just distal to the insertion of the ECRB  There were no complications and a sterile bandage was applied.  Fu prn   8:53 AM Arther Abbott, MD  07/08/2019

## 2019-07-08 NOTE — Patient Instructions (Addendum)
Continue to ice the elbow daily  Wear the tennis elbow brace at work  Adjust the work environment  Take prednisone 10 mg 3 times a day  Make sure you do your tennis elbow exercises for the next 6 weeks  Expect resolution in 2 months    Tennis Elbow  Tennis elbow (lateral epicondylitis) is inflammation of tendons in your outer forearm, near your elbow. Tendons are tissues that connect muscle to bone. When you have tennis elbow, inflammation affects the tendons that you use to bend your wrist and move your hand up. Inflammation occurs in the lower part of the upper arm bone (humerus), where the tendons connect to the bone (lateral epicondyle). Tennis elbow often affects people who play tennis, but anyone may get the condition from repeatedly extending the wrist or turning the forearm. What are the causes? This condition is usually caused by repeatedly extending the wrist, turning the forearm, and using the hands. It can result from sports or work that requires repetitive forearm movements. In some cases, it may be caused by a sudden injury. What increases the risk? You are more likely to develop tennis elbow if you play tennis or another racket sport. You also have a higher risk if you frequently use your hands for work. Besides people who play tennis, others at greater risk include:  Musicians.  Carpenters, painters, and plumbers.  Cooks.  Cashiers.  People who work in [employer].  Architect workers.  Butchers.  People who use computers. What are the signs or symptoms? Symptoms of this condition include:  Pain and tenderness in the forearm and the outer part of the elbow. Pain may be felt only when using the arm, or it may be there all the time.  A burning feeling that starts in the elbow and spreads down the forearm.  A weak grip in the hand. How is this diagnosed? This condition may be diagnosed based on:  Your symptoms and medical history.  A physical  exam.  X-rays.  MRI. How is this treated? Resting and icing your arm is often the first treatment. Your health care provider may also recommend:  Medicines to reduce pain and inflammation. These may be in the form of a pill, topical gels, or shots of a steroid medicine (cortisone).  An elbow strap to reduce stress on the area.  Physical therapy. This may include massage or exercises.  An elbow brace to restrict the movements that cause symptoms. If these treatments do not help relieve your symptoms, your health care provider may recommend surgery to remove damaged muscle and reattach healthy muscle to bone. Follow these instructions at home: Activity  Rest your elbow and wrist and avoid activities that cause symptoms, as told by your health care provider.  Do physical therapy exercises as instructed.  If you lift an object, lift it with your palm facing up. This reduces stress on your elbow. Lifestyle  If your tennis elbow is caused by sports, check your equipment and make sure that: ? You are using it correctly. ? It is the best fit for you.  If your tennis elbow is caused by work or computer use, take frequent breaks to stretch your arm. Talk with your manager about ways to manage your condition at work. If you have a brace:  Wear the brace or strap as told by your health care provider. Remove it only as told by your health care provider.  Loosen the brace if your fingers tingle, become numb, or  turn cold and blue.  Keep the brace clean.  If the brace is not waterproof, ask if you may remove it for bathing. If you must keep the brace on while bathing: ? Do not let it get wet. ? Cover it with a watertight covering when you take a bath or a shower. General instructions   If directed, put ice on the painful area: ? Put ice in a plastic bag. ? Place a towel between your skin and the bag. ? Leave the ice on for 20 minutes, 2-3 times a day.  Take over-the-counter and  prescription medicines only as told by your health care provider.  Keep all follow-up visits as told by your health care provider. This is important. Contact a health care provider if:  You have pain that gets worse or does not get better with treatment.  You have numbness or weakness in your forearm, hand, or fingers. Summary  Tennis elbow (lateral epicondylitis) is inflammation of tendons in your outer forearm, near your elbow.  Common symptoms include pain and tenderness in your forearm and the outer part of your elbow.  This condition is usually caused by repeatedly extending your wrist, turning your forearm, and using your hands.  The first treatment is often resting and icing your arm to relieve symptoms. Further treatment may include taking medicine, getting physical therapy, wearing a brace or strap, or having surgery. This information is not intended to replace advice given to you by your health care provider. Make sure you discuss any questions you have with your health care provider. Document Released: 08/13/2005 Document Revised: 05/09/2018 Document Reviewed: 05/28/2017 Elsevier Patient Education  2020 [employer].

## 2019-07-23 ENCOUNTER — Other Ambulatory Visit: Payer: Self-pay

## 2019-07-23 ENCOUNTER — Emergency Department (HOSPITAL_COMMUNITY): Payer: Commercial Managed Care - PPO

## 2019-07-23 ENCOUNTER — Emergency Department (HOSPITAL_COMMUNITY)
Admission: EM | Admit: 2019-07-23 | Discharge: 2019-07-23 | Disposition: A | Discharge status: Home / Self Care | Payer: Commercial Managed Care - PPO | Attending: Emergency Medicine | Admitting: Emergency Medicine

## 2019-07-23 ENCOUNTER — Encounter (HOSPITAL_COMMUNITY): Payer: Self-pay | Admitting: *Deleted

## 2019-07-23 DIAGNOSIS — K5900 Constipation, unspecified: Secondary | ICD-10-CM | POA: Diagnosis not present

## 2019-07-23 DIAGNOSIS — Z87891 Personal history of nicotine dependence: Secondary | ICD-10-CM | POA: Insufficient documentation

## 2019-07-23 DIAGNOSIS — I10 Essential (primary) hypertension: Secondary | ICD-10-CM | POA: Insufficient documentation

## 2019-07-23 DIAGNOSIS — K625 Hemorrhage of anus and rectum: Secondary | ICD-10-CM | POA: Diagnosis not present

## 2019-07-23 DIAGNOSIS — R1012 Left upper quadrant pain: Secondary | ICD-10-CM | POA: Diagnosis not present

## 2019-07-23 DIAGNOSIS — Z79899 Other long term (current) drug therapy: Secondary | ICD-10-CM | POA: Diagnosis not present

## 2019-07-23 LAB — COMPREHENSIVE METABOLIC PANEL
ALT: 20 U/L (ref 0–44)
AST: 15 U/L (ref 15–41)
Albumin: 3.6 g/dL (ref 3.5–5.0)
Alkaline Phosphatase: 65 U/L (ref 38–126)
Anion gap: 9 (ref 5–15)
BUN: 10 mg/dL (ref 6–20)
CO2: 26 mmol/L (ref 22–32)
Calcium: 9 mg/dL (ref 8.9–10.3)
Chloride: 101 mmol/L (ref 98–111)
Creatinine, Ser: 0.66 mg/dL (ref 0.44–1.00)
GFR calc Af Amer: >60 mL/min (ref >60)
GFR calc non Af Amer: >60 mL/min (ref >60)
Glucose, Bld: 95 mg/dL (ref 70–99)
Potassium: 3.5 mmol/L (ref 3.5–5.1)
Sodium: 136 mmol/L (ref 135–145)
Total Bilirubin: 0.3 mg/dL (ref 0.3–1.2)
Total Protein: 6.5 g/dL (ref 6.5–8.1)

## 2019-07-23 LAB — CBC WITH DIFFERENTIAL/PLATELET
Abs Immature Granulocytes: 0.08 10*3/uL — ABNORMAL HIGH (ref 0.00–0.07)
Basophils Absolute: 0.1 10*3/uL (ref 0.0–0.1)
Basophils Relative: 0 %
Eosinophils Absolute: 0.1 10*3/uL (ref 0.0–0.5)
Eosinophils Relative: 0 %
HCT: 42.9 % (ref 36.0–46.0)
Hemoglobin: 13.9 g/dL (ref 12.0–15.0)
Immature Granulocytes: 0 %
Lymphocytes Relative: 27 %
Lymphs Abs: 5.1 10*3/uL — ABNORMAL HIGH (ref 0.7–4.0)
MCH: 28.9 pg (ref 26.0–34.0)
MCHC: 32.4 g/dL (ref 30.0–36.0)
MCV: 89.2 fL (ref 80.0–100.0)
Monocytes Absolute: 1.6 10*3/uL — ABNORMAL HIGH (ref 0.1–1.0)
Monocytes Relative: 9 %
Neutro Abs: 11.8 10*3/uL — ABNORMAL HIGH (ref 1.7–7.7)
Neutrophils Relative %: 64 %
Platelets: 383 10*3/uL (ref 150–400)
RBC: 4.81 MIL/uL (ref 3.87–5.11)
RDW: 13.7 % (ref 11.5–15.5)
WBC: 18.7 10*3/uL — ABNORMAL HIGH (ref 4.0–10.5)
nRBC: 0 % (ref 0.0–0.2)

## 2019-07-23 LAB — POC OCCULT BLOOD, ED: Fecal Occult Bld: NEGATIVE

## 2019-07-23 MED ORDER — SODIUM CHLORIDE 0.9 % IV SOLN
INTRAVENOUS | Status: DC
  Administered 2019-07-23: 11:00:00 via INTRAVENOUS

## 2019-07-23 MED ORDER — IOHEXOL 300 MG/ML  SOLN
100.0000 mL | Freq: Once | INTRAMUSCULAR | Status: AC | PRN
  Administered 2019-07-23: 100 mL via INTRAVENOUS

## 2019-07-23 NOTE — ED Provider Notes (Signed)
[facility] EMERGENCY DEPARTMENT Provider Note   CSN: [id number] Arrival date & time: 07/23/19  0940     History   Chief Complaint Chief Complaint  Patient presents with   Rectal Bleeding    HPI [name] [name] is a 48 y.o. female.     HPI   She presents for evaluation of rectal bleeding.  She was treated for tennis elbow with a prednisone taper, on 07/08/2019, according to documentation in this record.  The patient has her pill bottle with her, it was dispensed on 07/08/2019, with label directions stating take one 10 mg tablet 3 times a day, #60 prescribed.  The patient states she is continuing to take this medicine.  Patient states she is occasionally constipated, and has left upper quadrant abdominal pain at this time.  For the last 3 days she has noted blood mixed with stool, and when she wipes, with bleeding only occurring during stooling.  She denies fever, chills, nausea, vomiting, weakness or dizziness.  No prior similar problems.  No known history of hemorrhoids.  There are no other known modifying factors.  Past Medical History:  Diagnosis Date   Abnormal vaginal Pap smear    Dr Glo Herring   Allergic rhinitis    BV (bacterial vaginosis) 02/09/2015   Depression, recurrent ([place]) 07/31/2013   PHQ 9 score of 13 on 11/23/2013, score of 12 in 01/2018, not suicidal or homicidal   Fibroids 10/28/2013   Hyperlipidemia    x4 years    Hypertension    whenshe wasa young but she changed diet and reducd salt intake    Irregular intermenstrual bleeding 10/28/2013   Kidney stones    Mental disorder    Unspecified symptom associated with female genital organs 10/28/2013   Vaginal discharge 02/09/2015   Vaginal odor 02/09/2015   Vaginal Pap smear, abnormal     Patient Active Problem List   Diagnosis Date Noted   Otitis externa, acute noninfectious 06/13/2019   Depression, major, single episode, 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/13/2019   Elbow pain, right 06/09/2019    Sore throat 05/29/2019   Dry cough 05/29/2019   Maxillary sinusitis 12/16/2018   S/P hysterectomy 06/04/2018   Menorrhagia with irregular cycle 05/12/2018   Abnormal uterine bleeding (AUB) 04/01/2017   Benign essential HTN 04/08/2014   Generalized anxiety disorder 11/23/2013   Fibroids, submucosal 10/28/2013   Insomnia 07/31/2013   Vitamin D deficiency 12/24/2010   Hyperlipidemia LDL goal <100 11/30/2009   Allergic rhinitis 07/27/2009   GERD 07/27/2009    Past Surgical History:  Procedure Laterality Date   ABDOMINAL HYSTERECTOMY N/A 06/04/2018   Procedure: TOTAL ABDOMINAL HYSTERECTOMY WITH LEFT OOPHORECTOMY;  Surgeon: Florian Buff, MD;  Location: [facility];  Service: Gynecology;  Laterality: N/A;   BREAST REDUCTION SURGERY  Jan 2007   CERVICAL BIOPSY  2010   for abnormal pap    cyst removed from both breast , all benign both breast     DILITATION & CURRETTAGE/HYSTROSCOPY WITH THERMACHOICE ABLATION N/A 12/15/2013   Procedure: DILATATION & CURETTAGE/HYSTEROSCOPY WITH THERMACHOICE ABLATION;  Surgeon: Jonnie Kind, MD;  Location: [facility];  Service: Gynecology;  Laterality: N/A;  total therapy time:8min 1sec D5W  79ml in, D5W  9ml out Temperature 87 degree c.   EXCISION OF SKIN TAG Right 12/15/2013   Procedure: EXCISION OF SKIN TAG;  Surgeon: Jonnie Kind, MD;  Location: [facility];  Service: Gynecology;  Laterality: Right;   LAPAROSCOPIC BILATERAL SALPINGECTOMY Bilateral 12/15/2013   Procedure: LAPAROSCOPIC  BILATERAL SALPINGECTOMY;  Surgeon: Jonnie Kind, MD;  Location: [facility];  Service: Gynecology;  Laterality: Bilateral;   REMOVAL OF NON VAGINAL CONTRACEPTIVE DEVICE Left 12/15/2013   Procedure: REMOVAL OF NON VAGINAL CONTRACEPTIVE DEVICE-IMPLANON;  Surgeon: Jonnie Kind, MD;  Location: [facility];  Service: Gynecology;  Laterality: Left;     OB History    Gravida  2   Para  0   Term  0   Preterm      AB  2   Living        SAB      TAB  2    Ectopic      Multiple      Live Births               Home Medications    Prior to Admission medications   Medication Sig Start Date End Date Taking? Authorizing Provider  acetic acid-hydrocortisone (VOSOL-HC) OTIC solution 2 (two) times daily.   Yes [provider]  amLODipine (NORVASC) 2.5 MG tablet Take 1 tablet (2.5 mg total) by mouth daily. 06/09/19  Yes Fayrene Helper, MD  aspirin-acetaminophen-caffeine (EXCEDRIN MIGRAINE) [phone] MG tablet Take 1 tablet by mouth daily as needed for headache.   Yes [provider]  busPIRone (BUSPAR) 5 MG tablet TAKE 1 TABLET(5 MG) BY MOUTH TWICE DAILY 03/04/19  Yes Perlie Mayo, NP  clonazePAM (KLONOPIN) 1 MG tablet Take 1 mg by mouth 2 (two) times daily as needed.    Yes [provider]  DULoxetine (CYMBALTA) 60 MG capsule Take 1 capsule (60 mg total) by mouth daily. 05/07/19  Yes Fayrene Helper, MD  lansoprazole (PREVACID) 30 MG capsule Take 30 mg by mouth 2 (two) times daily before a meal. Patient prefers this one and didn't want to take Protonix   Yes [provider]  montelukast (SINGULAIR) 10 MG tablet Take 1 tablet (10 mg total) by mouth at bedtime. 12/16/18  Yes Fayrene Helper, MD  predniSONE (DELTASONE) 10 MG tablet Take 1 tablet (10 mg total) by mouth 3 (three) times daily after meals. 07/08/19  Yes Carole Civil, MD  rosuvastatin (CRESTOR) 10 MG tablet TAKE 1 TABLET(10 MG) BY MOUTH DAILY 07/07/19  Yes Fayrene Helper, MD    Family History Family History  Problem Relation Age of Onset   Fibromyalgia Mother    Hypertension Mother    Thyroid disease Mother    Arthritis Mother    Alcohol abuse Father    COPD Father    Bipolar disorder Father    Bipolar disorder Sister    Anxiety disorder Sister        has panic attacks   Bipolar disorder Brother    Alcohol abuse Brother    Alcohol abuse Brother    Alcohol abuse Brother    Other Brother         handicap   Alcohol abuse Brother    Diabetes Maternal Grandfather    Other Paternal Grandfather        commited suicide    Social History Social History   Tobacco Use   Smoking status: Former Smoker    Packs/day: 0.20    Years: 20.00    Pack years: 4.00    Types: Cigarettes    Quit date: 06/05/2009    Years since quitting: 10.1   Smokeless tobacco: Never Used  Substance Use Topics   Alcohol use: Yes    Comment: occasionally, couple drinks per mo  Drug use: No     Allergies   Patient has no known allergies.   Review of Systems Review of Systems  All other systems reviewed and are negative.    Physical Exam Updated Vital Signs BP (!) 127/102    Pulse (!) 101    Temp 98.5 F (36.9 C) (Oral)    Resp 20    Ht 5' (1.524 m)    Wt 60.8 kg    LMP 04/20/2018    SpO2 97%    BMI 26.17 kg/m   Physical Exam Vitals signs and nursing note reviewed.  Constitutional:      General: She is not in acute distress.    Appearance: Normal appearance. She is well-developed. She is not ill-appearing, toxic-appearing or diaphoretic.  HENT:     Head: Normocephalic and atraumatic.     Right Ear: External ear normal.     Left Ear: External ear normal.  Eyes:     Conjunctiva/sclera: Conjunctivae normal.     Pupils: Pupils are equal, round, and reactive to light.  Neck:     Musculoskeletal: Normal range of motion and neck supple.     Trachea: Phonation normal.  Cardiovascular:     Rate and Rhythm: Normal rate and regular rhythm.     Heart sounds: Normal heart sounds.  Pulmonary:     Effort: Pulmonary effort is normal.     Breath sounds: Normal breath sounds.  Abdominal:     Palpations: Abdomen is soft.     Tenderness: There is no abdominal tenderness.  Genitourinary:    Comments: Normal anus, no external hemorrhoids or anal fissure.  On digital examination no internal hemorrhoids, stool or mass.  Mucus sent for Hemoccult testing. Musculoskeletal: Normal range of motion.    Skin:    General: Skin is warm and dry.  Neurological:     Mental Status: She is alert and oriented to person, place, and time.     Cranial Nerves: No cranial nerve deficit.     Sensory: No sensory deficit.     Motor: No abnormal muscle tone.     Coordination: Coordination normal.  Psychiatric:        Mood and Affect: Mood normal.        Behavior: Behavior normal.        Thought Content: Thought content normal.        Judgment: Judgment normal.      ED Treatments / Results  Labs (all labs ordered are listed, but only abnormal results are displayed) Labs Reviewed  CBC WITH DIFFERENTIAL/PLATELET - Abnormal; Notable for the following components:      Result Value   WBC 18.7 (*)    Neutro Abs 11.8 (*)    Lymphs Abs 5.1 (*)    Monocytes Absolute 1.6 (*)    Abs Immature Granulocytes 0.08 (*)    All other components within normal limits  COMPREHENSIVE METABOLIC PANEL  POC OCCULT BLOOD, ED    EKG None  Radiology Ct Abdomen Pelvis W Contrast  Result Date: 07/23/2019 CLINICAL DATA:  Intermittent blood in stools x3 days EXAM: CT ABDOMEN AND PELVIS WITH CONTRAST TECHNIQUE: Multidetector CT imaging of the abdomen and pelvis was performed using the standard protocol following bolus administration of intravenous contrast. CONTRAST:  120mL OMNIPAQUE IOHEXOL 300 MG/ML  SOLN COMPARISON:  09/29/2010 FINDINGS: Lower chest: No acute abnormality. Hepatobiliary: No focal liver abnormality is seen. No gallstones, gallbladder wall thickening, or biliary dilatation. Pancreas: Unremarkable. No pancreatic ductal dilatation or surrounding inflammatory  changes. Spleen: Normal in size without focal abnormality. Adrenals/Urinary Tract: Adrenal glands are unremarkable. Kidneys are normal, without renal calculi, focal lesion, or hydronephrosis. Bladder is unremarkable. Stomach/Bowel: Stomach is decompressed. Small bowel is nondilated. Terminal ileum unremarkable. Appendix not identified. No pericecal  inflammatory/edematous change. Colon is nondilated containing moderate fecal material. Vascular/Lymphatic: No significant vascular findings are present. No enlarged abdominal or pelvic lymph nodes. Reproductive: Status post hysterectomy. 3 cm right ovarian cyst, probably physiologic. Other: Trace pelvic ascites. No free air. Musculoskeletal: No acute or significant osseous findings. IMPRESSION: 1. No mass or acute findings. Electronically Signed   By: Lucrezia Europe M.D.   On: 07/23/2019 13:16    Procedures Procedures (including critical care time)  Medications Ordered in ED Medications  0.9 %  sodium chloride infusion ( Intravenous New Bag/Given 07/23/19 1041)  iohexol (OMNIPAQUE) 300 MG/ML solution 100 mL (100 mLs Intravenous Contrast Given 07/23/19 1256)     Initial Impression / Assessment and Plan / ED Course  I have reviewed the triage vital signs and the nursing notes.  Pertinent labs & imaging results that were available during my care of the patient were reviewed by me and considered in my medical decision making (see chart for details).  Clinical Course as of Jul 23 1339  Thu Jul 23, 2019  1330 Normal  Comprehensive metabolic panel [EW]  0000000 Normal except white count high  CBC with Differential(!) [EW]  1330 Normal  POC occult blood, ED [EW]  1332 Findings by radiology, 3 cm right ovarian cyst, no other acute abnormalities.  I reviewed the films.  CT Abdomen Pelvis W Contrast [EW]    Clinical Course User Index [EW] Daleen Bo, MD        Patient Vitals for the past 24 hrs:  BP Temp Temp src Pulse Resp SpO2 Height Weight  07/23/19 0954 -- -- -- -- -- -- -- 60.8 kg  07/23/19 0951 -- -- -- -- -- -- 5' (1.524 m) --  07/23/19 0950 (!) 127/102 98.5 F (36.9 C) Oral (!) 101 20 97 % -- --    1:40 PM Reevaluation with update and discussion. After initial assessment and treatment, an updated evaluation reveals no change in clinical status, she remains comfortable, findings  discussed and questions answered. Daleen Bo   Medical Decision Making: Patient presenting for visualized blood with stooling for several days.  Initial evaluation reassuring, no blood on testing, Hemoccult negative.  ED evaluation indicates normal chemistry panel, CBC with mild white blood cell count elevation.  CT scan reassuring with only right ovarian cyst found.  Patient has not had fever.  No evidence for acute intestinal infection. Hemoglobin normal.  Vital signs are reassuring.      CRITICAL CARE-no Performed by: Daleen Bo  Nursing Notes Reviewed/ Care Coordinated Applicable Imaging Reviewed Interpretation of Laboratory Data incorporated into ED treatment  The patient appears reasonably screened and/or stabilized for discharge and I doubt any other medical condition or other [facility] requiring further screening, evaluation, or treatment in the ED at this time prior to discharge.  Plan: Home Medications-OTC as needed, stool softener regularly; Home Treatments-clears advance diet and activity; return here if the recommended treatment, does not improve the symptoms; Recommended follow up-PCP checkup next week, and as needed    Final Clinical Impressions(s) / ED Diagnoses   Final diagnoses:  Rectal bleeding  Constipation, unspecified constipation type  Left upper quadrant abdominal pain    ED Discharge Orders    None  Daleen Bo, MD 07/23/19 1341

## 2019-07-23 NOTE — ED Triage Notes (Signed)
Blood in stool for the past 3 days

## 2019-07-23 NOTE — Discharge Instructions (Addendum)
The testing did not show any serious problems.  Your hemoglobin level is normal.  You have a small right ovarian cyst, that is likely not a problem.  Your white blood cell count was elevated.  The CAT scan did not show any intra-abdominal infection, including diverticulitis, colitis or other serious abnormalities.  Try taking Colace, stool softener, twice a day for a week or 2 to see if that improves her symptoms.  See your doctor next week for checkup including repeat blood testing.  Return here, if needed, for problems.

## 2019-08-04 ENCOUNTER — Other Ambulatory Visit: Payer: Self-pay | Admitting: Family Medicine

## 2019-08-04 ENCOUNTER — Telehealth: Payer: Self-pay | Admitting: *Deleted

## 2019-08-04 MED ORDER — CLONAZEPAM 1 MG PO TABS: ORAL_TABLET | ORAL | 0 refills | Status: DC

## 2019-08-04 NOTE — Telephone Encounter (Signed)
Med sent.

## 2019-08-04 NOTE — Telephone Encounter (Signed)
Pt got her meds but did not receive colonazepam and needs this sent to [facility]

## 2019-08-04 NOTE — Telephone Encounter (Signed)
Advised patient that medication has been sent in

## 2019-09-01 ENCOUNTER — Other Ambulatory Visit: Payer: Self-pay

## 2019-09-01 DIAGNOSIS — F411 Generalized anxiety disorder: Secondary | ICD-10-CM

## 2019-09-01 MED ORDER — BUSPIRONE HCL 5 MG PO TABS: ORAL_TABLET | ORAL | 5 refills | Status: DC

## 2019-09-09 ENCOUNTER — Ambulatory Visit: Payer: Commercial Managed Care - PPO | Admitting: Family Medicine

## 2019-11-09 ENCOUNTER — Telehealth: Payer: Self-pay | Admitting: *Deleted

## 2019-11-09 ENCOUNTER — Other Ambulatory Visit: Payer: Self-pay | Admitting: *Deleted

## 2019-11-09 MED ORDER — DULOXETINE HCL 60 MG PO CPEP: 60.0000 mg | ORAL_CAPSULE | Freq: Every day | ORAL | 5 refills | Status: DC

## 2019-11-09 MED ORDER — ROSUVASTATIN CALCIUM 10 MG PO TABS: ORAL_TABLET | ORAL | 4 refills | Status: DC

## 2019-11-09 MED ORDER — AMLODIPINE BESYLATE 2.5 MG PO TABS: 2.5000 mg | ORAL_TABLET | Freq: Every day | ORAL | 1 refills | Status: DC

## 2019-11-09 MED ORDER — LANSOPRAZOLE 30 MG PO CPDR: 30.0000 mg | DELAYED_RELEASE_CAPSULE | Freq: Two times a day (BID) | ORAL | 2 refills | Status: DC

## 2019-11-09 NOTE — Telephone Encounter (Signed)
Pt is wanting to know if she can get her clonazepam filled.

## 2019-11-09 NOTE — Telephone Encounter (Signed)
LVM for pt to call the office.

## 2019-12-18 ENCOUNTER — Telehealth: Payer: Self-pay | Admitting: Adult Health

## 2019-12-18 MED ORDER — METRONIDAZOLE 500 MG PO TABS: 500.0000 mg | ORAL_TABLET | Freq: Two times a day (BID) | ORAL | 0 refills | Status: DC

## 2019-12-18 NOTE — Addendum Note (Signed)
Addended by: Derrek Monaco A on: 12/18/2019 01:59 PM   Modules accepted: Orders

## 2019-12-18 NOTE — Telephone Encounter (Signed)
Patient returned call. Patient states has smelly creamy discharge. States always gets these symptoms when hot. Patient would like something called in for BV. Advised would send message to Derrek Monaco, NP. Patient voiced unerstanding.

## 2019-12-18 NOTE — Telephone Encounter (Signed)
Pt is wanting to see if something for BV or a yeast infection can be sent in for her.

## 2019-12-18 NOTE — Telephone Encounter (Signed)
Left message that flagyl sent to [facility] [address]

## 2019-12-18 NOTE — Telephone Encounter (Signed)
Telephoned patient at home number and left message.  

## 2020-01-07 ENCOUNTER — Telehealth: Payer: Self-pay

## 2020-01-07 ENCOUNTER — Other Ambulatory Visit: Payer: Self-pay | Admitting: Family Medicine

## 2020-01-07 NOTE — Telephone Encounter (Signed)
Declined, she has not been seen since Dec. She will need an appt for a possible refill.  Thank you

## 2020-01-07 NOTE — Telephone Encounter (Signed)
Request refill for clonazepam.

## 2020-01-22 NOTE — Telephone Encounter (Signed)
Called and lmom

## 2020-01-26 ENCOUNTER — Telehealth: Payer: Self-pay

## 2020-01-26 NOTE — Telephone Encounter (Signed)
Pt is calling to get clonazePAM (KLONOPIN) 1 MG tablet.   Pt is stating that she takes this as needed,

## 2020-01-26 NOTE — Telephone Encounter (Signed)
Would you like to refill this for her?

## 2020-01-27 ENCOUNTER — Telehealth: Payer: Self-pay

## 2020-01-27 ENCOUNTER — Encounter: Payer: Self-pay | Admitting: Family Medicine

## 2020-01-27 NOTE — Telephone Encounter (Signed)
Left generic message requesting call back.  

## 2020-01-27 NOTE — Telephone Encounter (Signed)
Needs appt scheduled , has not beenm in since October , needs re eval of chronic conditions , please let her know and arrange

## 2020-01-27 NOTE — Telephone Encounter (Signed)
[name] [name] said she will not come in for meds.

## 2020-01-28 ENCOUNTER — Other Ambulatory Visit: Payer: Self-pay | Admitting: Family Medicine

## 2020-01-28 MED ORDER — CLONAZEPAM 1 MG PO TABS: ORAL_TABLET | ORAL | 0 refills | Status: DC

## 2020-01-29 NOTE — Telephone Encounter (Signed)
Spoke to patient. She is unable to schedule follow up appt at this time.

## 2020-01-29 NOTE — Telephone Encounter (Signed)
Left generic message for patient to call the office back.

## 2020-03-03 ENCOUNTER — Other Ambulatory Visit: Payer: Self-pay | Admitting: Family Medicine

## 2020-03-03 DIAGNOSIS — F411 Generalized anxiety disorder: Secondary | ICD-10-CM

## 2020-03-13 IMAGING — CT CT ABD-PELV W/ CM
2 of 5 series · 16 of 46 positions shown, 18 images · IV contrast (Omnipaque or Isovue)
Comparison: 09/29/2010

CLINICAL DATA: Intermittent blood in stools x3 days

EXAM:
CT ABDOMEN AND PELVIS WITH CONTRAST
TECHNIQUE: Multidetector CT imaging of the abdomen and pelvis was performed
using the standard protocol following bolus administration of
intravenous contrast.
CONTRAST:  100mL OMNIPAQUE IOHEXOL 300 MG/ML  SOLN

[Series 2: axial st · axial · 0.58mm/px · z∈[+877,+1262]mm · 13 of 91 slices shown, 15 images]
[im 7/91  soft-tissue]
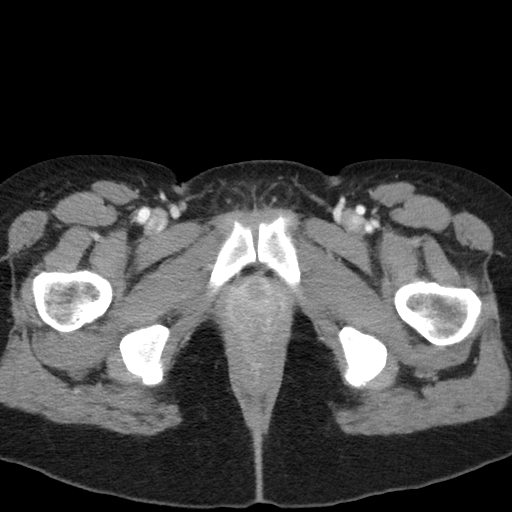
[im 7/91  bone]
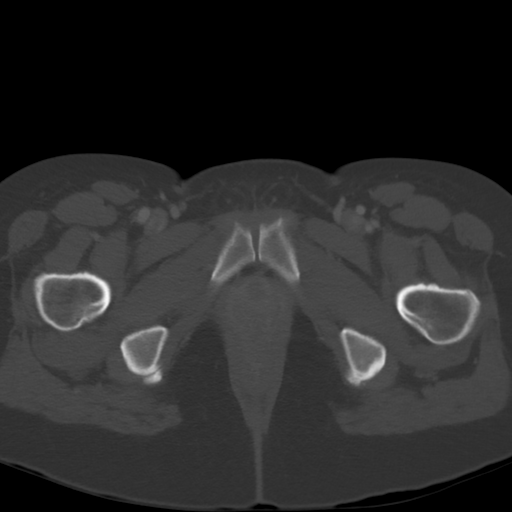
[im 13/91  soft-tissue]
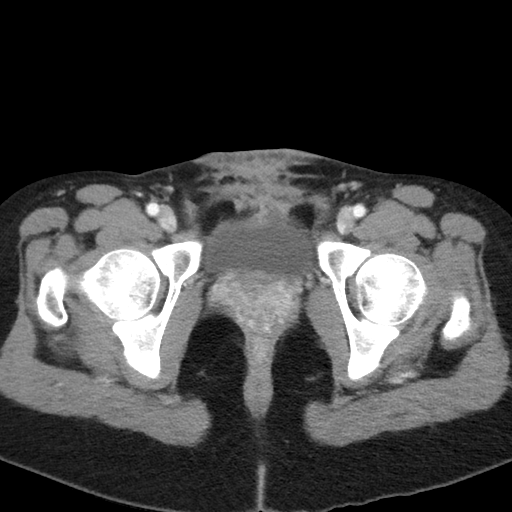
[im 20/91  soft-tissue]
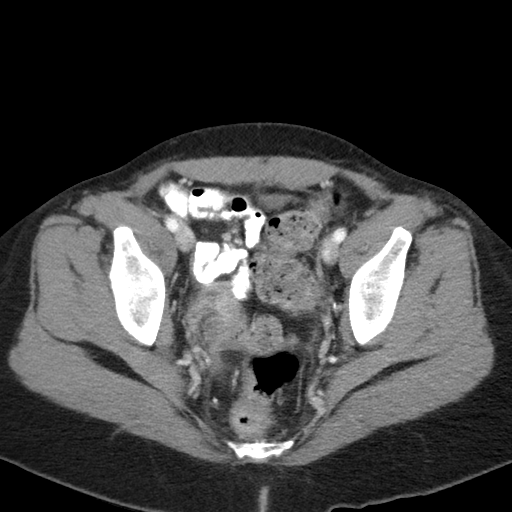
[im 26/91  soft-tissue]
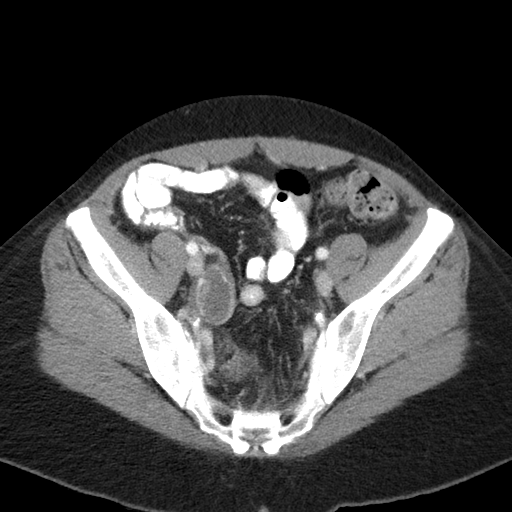
[im 33/91  soft-tissue]
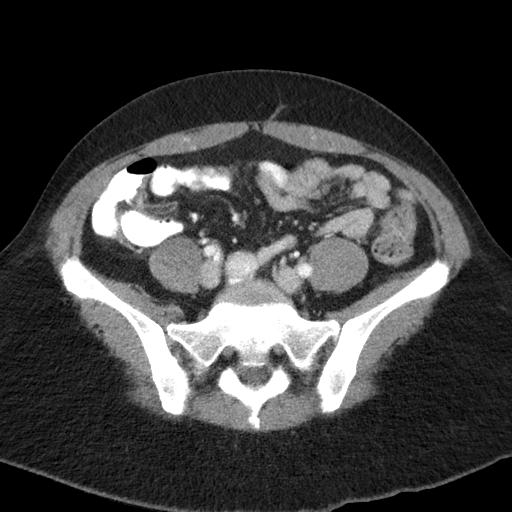
[im 39/91  soft-tissue]
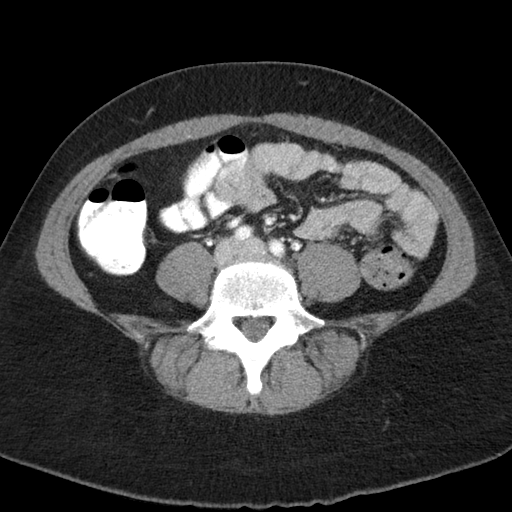
[im 46/91  soft-tissue]
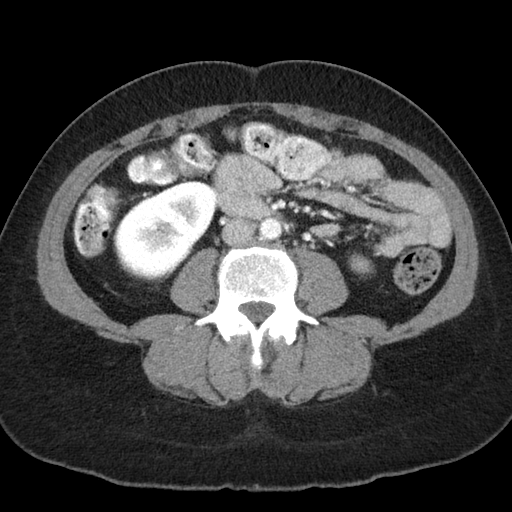
[im 52/91  soft-tissue]
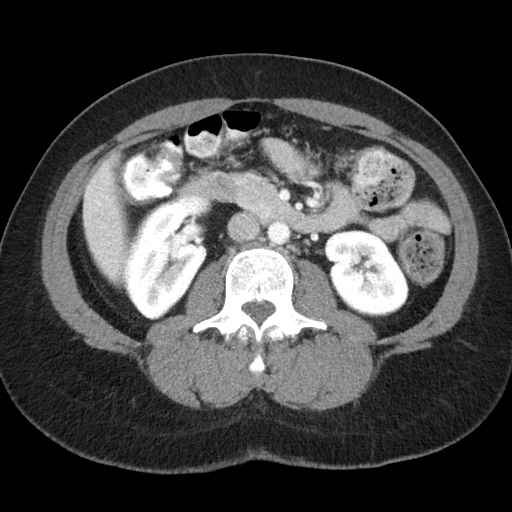
[im 58/91  soft-tissue]
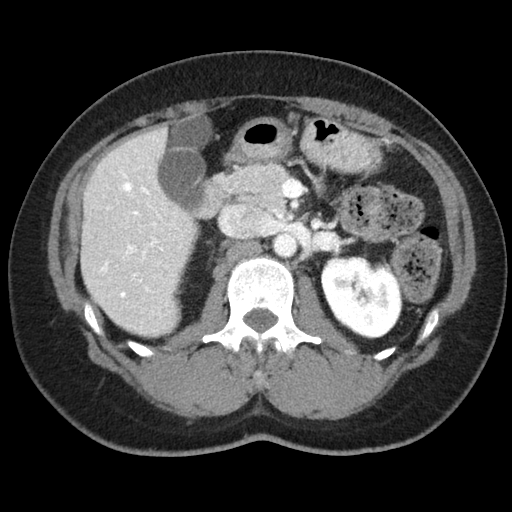
[im 58/91  bone]
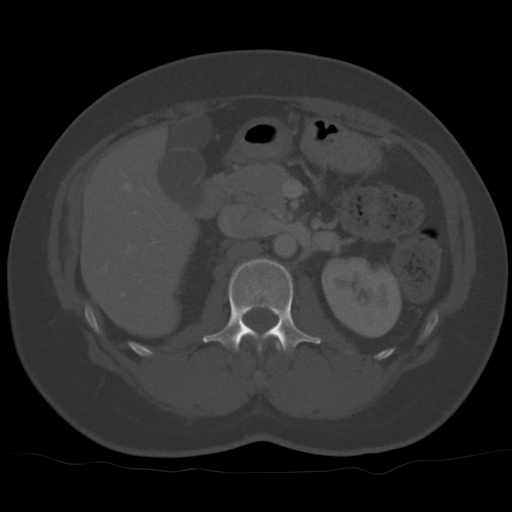
[im 65/91  soft-tissue]
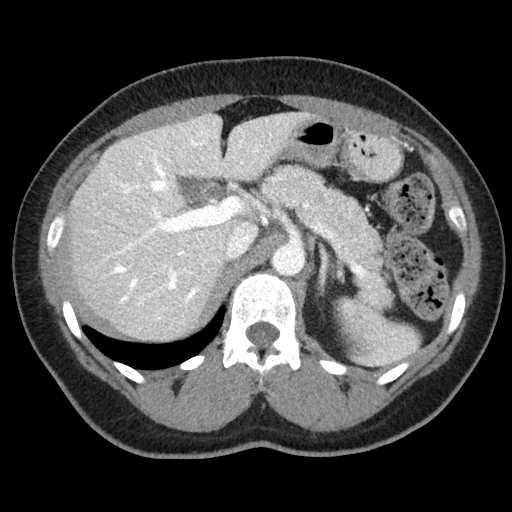
[im 71/91  soft-tissue]
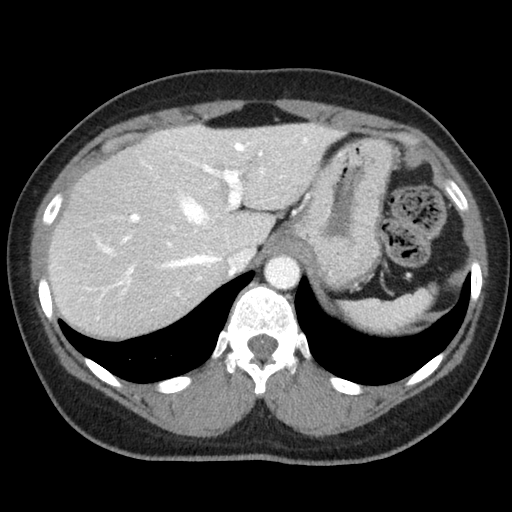
[im 78/91  soft-tissue]
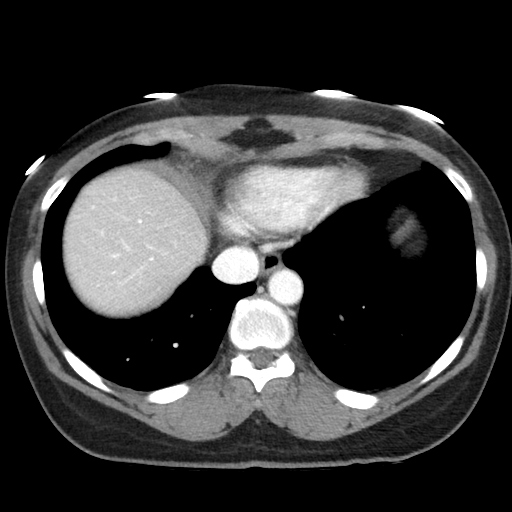
[im 84/91  soft-tissue]
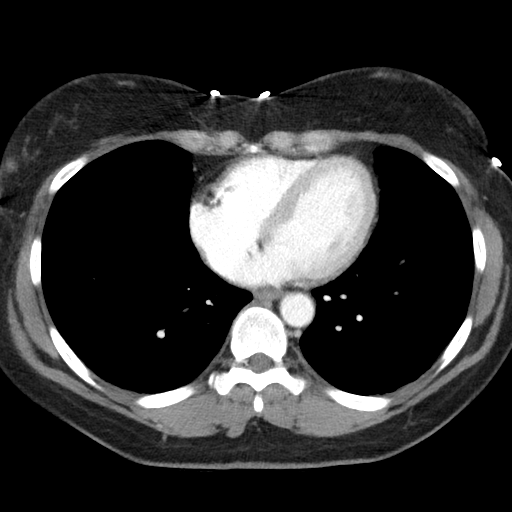

[Series 6: coronal st · coronal · 0.69mm/px · 3 of 112 slices shown]
[im 38/112  soft-tissue]
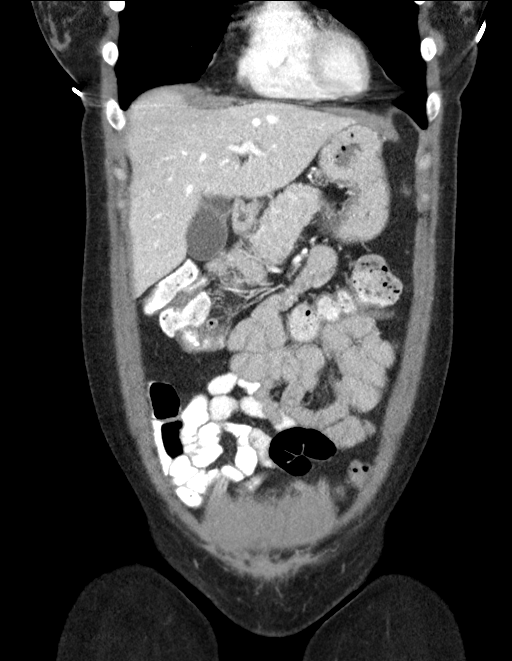
[im 50/112  soft-tissue]
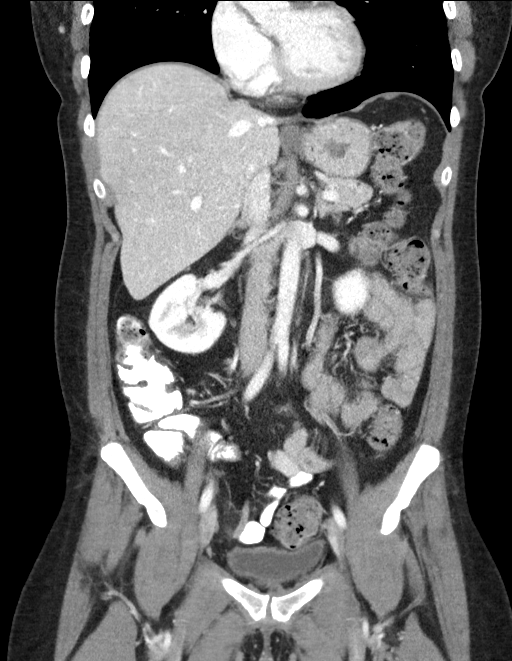
[im 62/112  soft-tissue]
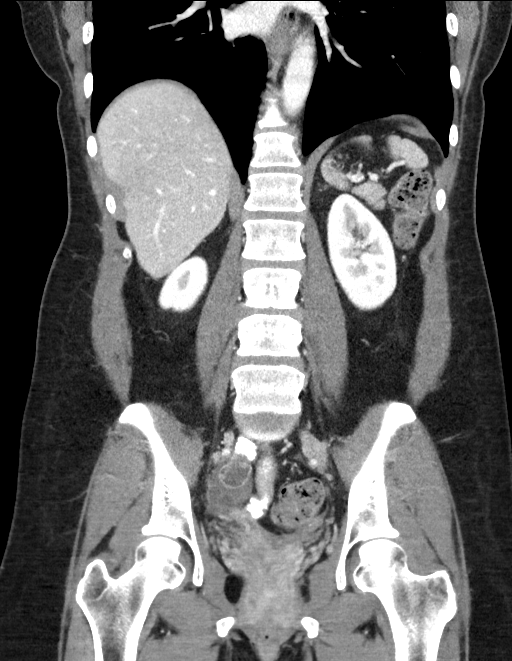

[16 of 46 positions shown; findings below may reference images not displayed]

FINDINGS: Lower chest: No acute abnormality.

Hepatobiliary: No focal liver abnormality is seen. No gallstones,
gallbladder wall thickening, or biliary dilatation.

Pancreas: Unremarkable. No pancreatic ductal dilatation or
surrounding inflammatory changes.

Spleen: Normal in size without focal abnormality.

Adrenals/Urinary Tract: Adrenal glands are unremarkable. Kidneys are
normal, without renal calculi, focal lesion, or hydronephrosis.
Bladder is unremarkable.

Stomach/Bowel: Stomach is decompressed.

Small bowel is nondilated. Terminal ileum unremarkable.

Appendix not identified. No pericecal inflammatory/edematous change.
Colon is nondilated containing moderate fecal material.

Vascular/Lymphatic: No significant vascular findings are present. No
enlarged abdominal or pelvic lymph nodes.

Reproductive: Status post hysterectomy. 3 cm right ovarian cyst,
probably physiologic.

Other: Trace pelvic ascites. No free air.

Musculoskeletal: No acute or significant osseous findings.
IMPRESSION: 1. No mass or acute findings.

## 2020-03-14 ENCOUNTER — Other Ambulatory Visit: Payer: Self-pay

## 2020-03-14 ENCOUNTER — Encounter: Payer: Self-pay | Admitting: Family Medicine

## 2020-03-14 ENCOUNTER — Ambulatory Visit: Payer: Commercial Managed Care - PPO | Admitting: Family Medicine

## 2020-03-14 VITALS — BP 152/92 | HR 96 | Temp 98.5°F | Resp 18 | Ht 60.0 in | Wt 148.1 lb

## 2020-03-14 DIAGNOSIS — E785 Hyperlipidemia, unspecified: Secondary | ICD-10-CM

## 2020-03-14 DIAGNOSIS — G5603 Carpal tunnel syndrome, bilateral upper limbs: Secondary | ICD-10-CM | POA: Diagnosis not present

## 2020-03-14 DIAGNOSIS — Z23 Encounter for immunization: Secondary | ICD-10-CM

## 2020-03-14 DIAGNOSIS — E663 Overweight: Secondary | ICD-10-CM | POA: Diagnosis not present

## 2020-03-14 DIAGNOSIS — E559 Vitamin D deficiency, unspecified: Secondary | ICD-10-CM

## 2020-03-14 DIAGNOSIS — F324 Major depressive disorder, single episode, in partial remission: Secondary | ICD-10-CM

## 2020-03-14 DIAGNOSIS — Z1211 Encounter for screening for malignant neoplasm of colon: Secondary | ICD-10-CM

## 2020-03-14 DIAGNOSIS — K5900 Constipation, unspecified: Secondary | ICD-10-CM

## 2020-03-14 DIAGNOSIS — I10 Essential (primary) hypertension: Secondary | ICD-10-CM | POA: Diagnosis not present

## 2020-03-14 DIAGNOSIS — Z1159 Encounter for screening for other viral diseases: Secondary | ICD-10-CM

## 2020-03-14 MED ORDER — AMLODIPINE BESYLATE 5 MG PO TABS: 5.0000 mg | ORAL_TABLET | Freq: Every day | ORAL | 3 refills | Status: DC

## 2020-03-14 NOTE — Assessment & Plan Note (Signed)
5 year history of progressive, weakness , pain of both hands right worse than left, refer Ortho

## 2020-03-14 NOTE — Patient Instructions (Addendum)
Physical with pap with MD, cancel appt with Jarrett Soho please, in 8 to 10 weeks also to re evaluate blood pressure,  call if you need me before  TdaP today  You are referred to Dr Aline Brochure and for colonoscopy and GI issues  New increased dose of amlodipine 7.5 mg daily, take a 5 mg tablet and 2.5 mg together  Labs today at work CBC, lipid, cmp and EGFr, TSH, Vit D and hep C screen  It is important that you exercise regularly at least 30 minutes 5 times a week. If you develop chest pain, have severe difficulty breathing, or feel very tired, stop exercising immediately and seek medical attention    DASH Eating Plan DASH stands for "Dietary Approaches to Stop Hypertension." The DASH eating plan is a healthy eating plan that has been shown to reduce high blood pressure (hypertension). It may also reduce your risk for type 2 diabetes, heart disease, and stroke. The DASH eating plan may also help with weight loss. What are tips for following this plan?  General guidelines  Avoid eating more than 2,300 mg (milligrams) of salt (sodium) a day. If you have hypertension, you may need to reduce your sodium intake to 1,500 mg a day.  Limit alcohol intake to no more than 1 drink a day for nonpregnant women and 2 drinks a day for men. One drink equals 12 oz of beer, 5 oz of wine, or 1 oz of hard liquor.  Work with your health care provider to maintain a healthy body weight or to lose weight. Ask what an ideal weight is for you.  Get at least 30 minutes of exercise that causes your heart to beat faster (aerobic exercise) most days of the week. Activities may include walking, swimming, or biking.  Work with your health care provider or diet and nutrition specialist (dietitian) to adjust your eating plan to your individual calorie needs. Reading food labels   Check food labels for the amount of sodium per serving. Choose foods with less than 5 percent of the Daily Value of sodium. Generally, foods with  less than 300 mg of sodium per serving fit into this eating plan.  To find whole grains, look for the word "whole" as the first word in the ingredient list. Shopping  Buy products labeled as "low-sodium" or "no salt added."  Buy fresh foods. Avoid canned foods and premade or frozen meals. Cooking  Avoid adding salt when cooking. Use salt-free seasonings or herbs instead of table salt or sea salt. Check with your health care provider or pharmacist before using salt substitutes.  Do not fry foods. Cook foods using healthy methods such as baking, boiling, grilling, and broiling instead.  Cook with heart-healthy oils, such as olive, canola, soybean, or sunflower oil. Meal planning  Eat a balanced diet that includes: ? 5 or more servings of fruits and vegetables each day. At each meal, try to fill half of your plate with fruits and vegetables. ? Up to 6-8 servings of whole grains each day. ? Less than 6 oz of lean meat, poultry, or fish each day. A 3-oz serving of meat is about the same size as a deck of cards. One egg equals 1 oz. ? 2 servings of low-fat dairy each day. ? A serving of nuts, seeds, or beans 5 times each week. ? Heart-healthy fats. Healthy fats called Omega-3 fatty acids are found in foods such as flaxseeds and coldwater fish, like sardines, salmon, and mackerel.  Limit how  much you eat of the following: ? Canned or prepackaged foods. ? Food that is high in trans fat, such as fried foods. ? Food that is high in saturated fat, such as fatty meat. ? Sweets, desserts, sugary drinks, and other foods with added sugar. ? Full-fat dairy products.  Do not salt foods before eating.  Try to eat at least 2 vegetarian meals each week.  Eat more home-cooked food and less restaurant, buffet, and fast food.  When eating at a restaurant, ask that your food be prepared with less salt or no salt, if possible. What foods are recommended? The items listed may not be a complete list.  Talk with your dietitian about what dietary choices are best for you. Grains Whole-grain or whole-wheat bread. Whole-grain or whole-wheat pasta. Brown rice. [place]. Bulgur. Whole-grain and low-sodium cereals. Pita bread. Low-fat, low-sodium crackers. Whole-wheat flour tortillas. Vegetables Fresh or frozen vegetables (raw, steamed, roasted, or grilled). Low-sodium or reduced-sodium tomato and vegetable juice. Low-sodium or reduced-sodium tomato sauce and tomato paste. Low-sodium or reduced-sodium canned vegetables. Fruits All fresh, dried, or frozen fruit. Canned fruit in natural juice (without added sugar). Meat and other protein foods Skinless chicken or [place]. Ground chicken or [place]. Pork with fat trimmed off. Fish and seafood. Egg whites. Dried beans, peas, or lentils. Unsalted nuts, nut butters, and seeds. Unsalted canned beans. Lean cuts of beef with fat trimmed off. Low-sodium, lean deli meat. Dairy Low-fat (1%) or fat-free (skim) milk. Fat-free, low-fat, or reduced-fat cheeses. Nonfat, low-sodium ricotta or cottage cheese. Low-fat or nonfat yogurt. Low-fat, low-sodium cheese. Fats and oils Soft margarine without trans fats. Vegetable oil. Low-fat, reduced-fat, or light mayonnaise and salad dressings (reduced-sodium). Canola, safflower, olive, soybean, and sunflower oils. Avocado. Seasoning and other foods Herbs. Spices. Seasoning mixes without salt. Unsalted popcorn and pretzels. Fat-free sweets. What foods are not recommended? The items listed may not be a complete list. Talk with your dietitian about what dietary choices are best for you. Grains Baked goods made with fat, such as croissants, muffins, or some breads. Dry pasta or rice meal packs. Vegetables Creamed or fried vegetables. Vegetables in a cheese sauce. Regular canned vegetables (not low-sodium or reduced-sodium). Regular canned tomato sauce and paste (not low-sodium or reduced-sodium). Regular tomato and vegetable  juice (not low-sodium or reduced-sodium). Angie Fava. Olives. Fruits Canned fruit in a light or heavy syrup. Fried fruit. Fruit in cream or butter sauce. Meat and other protein foods Fatty cuts of meat. Ribs. Fried meat. Berniece Salines. Sausage. Bologna and other processed lunch meats. Salami. Fatback. Hotdogs. Bratwurst. Salted nuts and seeds. Canned beans with added salt. Canned or smoked fish. Whole eggs or egg yolks. Chicken or [place] with skin. Dairy Whole or 2% milk, cream, and half-and-half. Whole or full-fat cream cheese. Whole-fat or sweetened yogurt. Full-fat cheese. Nondairy creamers. Whipped toppings. Processed cheese and cheese spreads. Fats and oils Butter. Stick margarine. Lard. Shortening. Ghee. Bacon fat. Tropical oils, such as coconut, palm kernel, or palm oil. Seasoning and other foods Salted popcorn and pretzels. Onion salt, garlic salt, seasoned salt, table salt, and sea salt. Worcestershire sauce. Tartar sauce. Barbecue sauce. Teriyaki sauce. Soy sauce, including reduced-sodium. Steak sauce. Canned and packaged gravies. Fish sauce. Oyster sauce. Cocktail sauce. Horseradish that you find on the shelf. Ketchup. Mustard. Meat flavorings and tenderizers. Bouillon cubes. Hot sauce and Tabasco sauce. Premade or packaged marinades. Premade or packaged taco seasonings. Relishes. Regular salad dressings. Where to find more information:  National Heart, Lung, and Blood Institute: https://wilson-eaton.com/  American Heart Association: www.heart.org Summary  The DASH eating plan is a healthy eating plan that has been shown to reduce high blood pressure (hypertension). It may also reduce your risk for type 2 diabetes, heart disease, and stroke.  With the DASH eating plan, you should limit salt (sodium) intake to 2,300 mg a day. If you have hypertension, you may need to reduce your sodium intake to 1,500 mg a day.  When on the DASH eating plan, aim to eat more fresh fruits and vegetables, whole grains,  lean proteins, low-fat dairy, and heart-healthy fats.  Work with your health care provider or diet and nutrition specialist (dietitian) to adjust your eating plan to your individual calorie needs. This information is not intended to replace advice given to you by your health care provider. Make sure you discuss any questions you have with your health care provider. Document Revised: 07/26/2017 Document Reviewed: 08/06/2016 Elsevier Patient Education  2020 [employer].

## 2020-03-14 NOTE — Progress Notes (Signed)
[name] [name]     MRN: [id number]      DOB: Mar 08, 1971   HPI [name] [name] is here for follow up and re-evaluation of chronic medical conditions, medication management and review of any available recent lab and radiology data.  Preventive health is updated, specifically  Cancer screening and Immunization.   Questions or concerns regarding consultations or procedures which the PT has had in the interim are  addressed. The PT denies any adverse reactions to current medications since the last visit.  C/o irregular bM and hard stool ROS Denies recent fever or chills. Denies sinus pressure, nasal congestion, ear pain or sore throat. Denies chest congestion, productive cough or wheezing. Denies chest pains, palpitations and leg swelling Denies abdominal pain, nausea, vomiting,diarrhea    Denies dysuria, frequency, hesitancy or incontinence. C/o bilateral hand weakness and numbness , which awakens her Denies headaches, seizures, numbness, or tingling. Denies depression, anxiety or insomnia. Denies skin break down or rash.   PE  BP (!) 152/92 (BP Location: Right Arm, Patient Position: Sitting, Cuff Size: Normal)   Pulse 96   Temp 98.5 F (36.9 C) (Oral)   Resp 18   Ht 5' (1.524 m)   Wt 148 lb 1.9 oz (67.2 kg)   LMP 04/20/2018   SpO2 96%   BMI 28.93 kg/m   Patient alert and oriented and in no cardiopulmonary distress.  HEENT: No facial asymmetry, EOMI,     Neck supple .  Chest: Clear to auscultation bilaterally.  CVS: S1, S2 no murmurs, no S3.Regular rate.  ABD: Soft non tender.   Ext: No edema  MS: Adequate ROM spine, shoulders, hips and knees. Posiitive tinel's bilaterally with thenar wasting, mild  Skin: Intact, no ulcerations or rash noted.  Psych: Good eye contact, normal affect. Memory intact not anxious or depressed appearing.  CNS: CN 2-12 intact, power,  normal throughout.no focal deficits noted.   Assessment & Plan  Bilateral carpal tunnel  syndrome 5 year history of progressive, weakness , pain of both hands right worse than left, refer Ortho  Constipation BM are every 2 days,takes laxative and stool softener, needs colonoscopy, had one epside of rectal bleeding Nov 2020  Benign essential HTN Uncontrolled, inc amlodipine to 7.5 mg daily DASH diet and commitment to daily physical activity for a minimum of 30 minutes discussed and encouraged, as a part of hypertension management. The importance of attaining a healthy weight is also discussed.  BP/Weight 03/14/2020 07/23/2019 07/08/2019 06/09/2019 05/28/2019 03/02/2019 4/65/0354  Systolic BP 656 812 751 700 174 944 967  Diastolic BP 92 591 638 94 93 93 93  Wt. (Lbs) 148.12 134 138 142 140 140 140.12  BMI 28.93 26.17 26.95 27.73 27.34 27.34 27.37  Some encounter information is confidential and restricted. Go to Review Flowsheets activity to see all data.       Hyperlipidemia LDL goal <100 Hyperlipidemia:Low fat diet discussed and encouraged.   Lipid Panel  Lab Results  Component Value Date   CHOL 250 (H) 12/01/2014   HDL 73 12/01/2014   LDLCALC 159 (H) 12/01/2014   TRIG 89 12/01/2014   CHOLHDL 3.4 12/01/2014   Updated lab needed at/ before next visit.    Overweight (BMI 25.0-29.9)  Patient re-educated about  the importance of commitment to a  minimum of 150 minutes of exercise per week as able.  The importance of healthy food choices with portion control discussed, as well as eating regularly and within a 12 hour window most days.  The need to choose "clean , green" food 50 to 75% of the time is discussed, as well as to make water the primary drink and set a goal of 64 ounces water daily.    Weight /BMI 03/14/2020 07/23/2019 07/08/2019  WEIGHT 148 lb 1.9 oz 134 lb 138 lb  HEIGHT 5\' 0"  5\' 0"  5\' 0"   BMI 28.93 kg/m2 26.17 kg/m2 26.95 kg/m2  Some encounter information is confidential and restricted. Go to Review Flowsheets activity to see all data.       Depression, major, single episode, in partial remission (HCC) Controlled, no change in medication

## 2020-03-14 NOTE — Assessment & Plan Note (Signed)
BM are every 2 days,takes laxative and stool softener, needs colonoscopy, had one epside of rectal bleeding Nov 2020

## 2020-03-15 ENCOUNTER — Ambulatory Visit: Payer: Commercial Managed Care - PPO | Admitting: Family Medicine

## 2020-03-21 DIAGNOSIS — E663 Overweight: Secondary | ICD-10-CM | POA: Insufficient documentation

## 2020-03-21 NOTE — Assessment & Plan Note (Signed)
Hyperlipidemia:Low fat diet discussed and encouraged.   Lipid Panel  Lab Results  Component Value Date   CHOL 250 (H) 12/01/2014   HDL 73 12/01/2014   LDLCALC 159 (H) 12/01/2014   TRIG 89 12/01/2014   CHOLHDL 3.4 12/01/2014  Updated lab needed at/ before next visit.    

## 2020-03-21 NOTE — Assessment & Plan Note (Signed)
Uncontrolled, inc amlodipine to 7.5 mg daily DASH diet and commitment to daily physical activity for a minimum of 30 minutes discussed and encouraged, as a part of hypertension management. The importance of attaining a healthy weight is also discussed.  BP/Weight 03/14/2020 07/23/2019 07/08/2019 06/09/2019 05/28/2019 03/02/2019 9/62/8366  Systolic BP 294 765 465 035 465 681 275  Diastolic BP 92 170 017 94 93 93 93  Wt. (Lbs) 148.12 134 138 142 140 140 140.12  BMI 28.93 26.17 26.95 27.73 27.34 27.34 27.37  Some encounter information is confidential and restricted. Go to Review Flowsheets activity to see all data.

## 2020-03-21 NOTE — Assessment & Plan Note (Signed)
Controlled, no change in medication  

## 2020-03-21 NOTE — Assessment & Plan Note (Signed)
  Patient re-educated about  the importance of commitment to a  minimum of 150 minutes of exercise per week as able.  The importance of healthy food choices with portion control discussed, as well as eating regularly and within a 12 hour window most days. The need to choose "clean , green" food 50 to 75% of the time is discussed, as well as to make water the primary drink and set a goal of 64 ounces water daily.    Weight /BMI 03/14/2020 07/23/2019 07/08/2019  WEIGHT 148 lb 1.9 oz 134 lb 138 lb  HEIGHT 5\' 0"  5\' 0"  5\' 0"   BMI 28.93 kg/m2 26.17 kg/m2 26.95 kg/m2  Some encounter information is confidential and restricted. Go to Review Flowsheets activity to see all data.

## 2020-04-04 ENCOUNTER — Telehealth: Payer: Self-pay

## 2020-04-04 ENCOUNTER — Other Ambulatory Visit: Payer: Self-pay | Admitting: *Deleted

## 2020-04-04 MED ORDER — DULOXETINE HCL 60 MG PO CPEP: 60.0000 mg | ORAL_CAPSULE | Freq: Every day | ORAL | 5 refills | Status: DC

## 2020-04-04 NOTE — Telephone Encounter (Signed)
Noted  

## 2020-04-04 NOTE — Telephone Encounter (Signed)
Returned patients call. No answer, left vm 

## 2020-04-04 NOTE — Telephone Encounter (Signed)
Patient would like for you to increase amlodipine to 10mg  so she only has to pay for one dose or she will only take the 5mg . She doesn't want to pay for 2 different doses anymore.

## 2020-04-04 NOTE — Telephone Encounter (Signed)
Pt said she was just here last month and she will just stop taking the medication as she is not coming in since she was just here. She said she would continue taking the 5 mg until she is out

## 2020-04-04 NOTE — Telephone Encounter (Signed)
Cymbalta & Amlodipine -Please combine these to a 10 mg, as its to much out of pocket for the pt.  Please call pt to advise

## 2020-04-04 NOTE — Telephone Encounter (Signed)
LVM for pt to call the office.

## 2020-04-04 NOTE — Telephone Encounter (Signed)
Cannot change dose without office re evaluation at last visit bP , , needs to take prescribed dose and schedule visit fpor evaluation before dose can be chnaged

## 2020-04-05 ENCOUNTER — Telehealth: Payer: Self-pay | Admitting: Family Medicine

## 2020-04-05 NOTE — Telephone Encounter (Signed)
Patient called about medication question she had from yesterday. Said she wants the mg changed of a medication for her bp.   *To note* Dr. Moshe Cipro was notified of this request yesterday and requested pt to make an appt. Patient refused appt for mg change.

## 2020-04-06 NOTE — Telephone Encounter (Signed)
Per her previous message-  She has been advised to take amlodipine 5 and 2.5 for a total of 7.5mg . Pt is wanting the 10mg  tab called in so she just has one rx. Dr Moshe Cipro advised that she come in to have her bp reassessed before she could increase it to 10. Patient refused and dr simpson stated she would not increase the med without checking her bp in the office because that was not safe practice

## 2020-04-07 ENCOUNTER — Ambulatory Visit: Payer: Commercial Managed Care - PPO | Admitting: Orthopedic Surgery

## 2020-04-07 ENCOUNTER — Encounter: Payer: Self-pay | Admitting: Orthopedic Surgery

## 2020-04-07 ENCOUNTER — Other Ambulatory Visit: Payer: Self-pay

## 2020-04-07 ENCOUNTER — Other Ambulatory Visit: Payer: Self-pay | Admitting: Family Medicine

## 2020-04-07 VITALS — BP 140/92 | HR 98 | Ht 60.0 in | Wt 148.0 lb

## 2020-04-07 DIAGNOSIS — R202 Paresthesia of skin: Secondary | ICD-10-CM

## 2020-04-07 DIAGNOSIS — G5603 Carpal tunnel syndrome, bilateral upper limbs: Secondary | ICD-10-CM

## 2020-04-07 NOTE — Patient Instructions (Addendum)
Vitamin B6 100 mg twice daily    Carpal Tunnel Syndrome  Carpal tunnel syndrome is a condition that causes pain in your hand and arm. The carpal tunnel is a narrow area that is on the palm side of your wrist. Repeated wrist motion or certain diseases may cause swelling in the tunnel. This swelling can pinch the main nerve in the wrist (median nerve). What are the causes? This condition may be caused by:  Repeated wrist motions.  Wrist injuries.  Arthritis.  A sac of fluid (cyst) or abnormal growth (tumor) in the carpal tunnel.  Fluid buildup during pregnancy. Sometimes the cause is not known. What increases the risk? The following factors may make you more likely to develop this condition:  Having a job in which you move your wrist in the same way many times. This includes jobs like being a Software engineer or a Scientist, water quality.  Being a woman.  Having other health conditions, such as: ? Diabetes. ? Obesity. ? A thyroid gland that is not active enough (hypothyroidism). ? Kidney failure. What are the signs or symptoms? Symptoms of this condition include:  A tingling feeling in your fingers.  Tingling or a loss of feeling (numbness) in your hand.  Pain in your entire arm. This pain may get worse when you bend your wrist and elbow for a long time.  Pain in your wrist that goes up your arm to your shoulder.  Pain that goes down into your palm or fingers.  A weak feeling in your hands. You may find it hard to grab and hold items. You may feel worse at night. How is this diagnosed? This condition is diagnosed with a medical history and physical exam. You may also have tests, such as:  Electromyogram (EMG). This test checks the signals that the nerves send to the muscles.  Nerve conduction study. This test checks how well signals pass through your nerves.  Imaging tests, such as X-rays, ultrasound, and MRI. These tests check for what might be the cause of your condition. How is this  treated? This condition may be treated with:  Lifestyle changes. You will be asked to stop or change the activity that caused your problem.  Doing exercise and activities that make bones and muscles stronger (physical therapy).  Learning how to use your hand again (occupational therapy).  Medicines for pain and swelling (inflammation). You may have injections in your wrist.  A wrist splint.  Surgery. Follow these instructions at home: If you have a splint:  Wear the splint as told by your doctor. Remove it only as told by your doctor.  Loosen the splint if your fingers: ? Tingle. ? Lose feeling (become numb). ? Turn cold and blue.  Keep the splint clean.  If the splint is not waterproof: ? Do not let it get wet. ? Cover it with a watertight covering when you take a bath or a shower. Managing pain, stiffness, and swelling   If told, put ice on the painful area: ? If you have a removable splint, remove it as told by your doctor. ? Put ice in a plastic bag. ? Place a towel between your skin and the bag. ? Leave the ice on for 20 minutes, 2-3 times per day. General instructions  Take over-the-counter and prescription medicines only as told by your doctor.  Rest your wrist from any activity that may cause pain. If needed, talk with your boss at work about changes that can help your wrist heal.  Do any exercises as told by your doctor, physical therapist, or occupational therapist.  Keep all follow-up visits as told by your doctor. This is important. Contact a doctor if:  You have new symptoms.  Medicine does not help your pain.  Your symptoms get worse. Get help right away if:  You have very bad numbness or tingling in your wrist or hand. Summary  Carpal tunnel syndrome is a condition that causes pain in your hand and arm.  It is often caused by repeated wrist motions.  Lifestyle changes and medicines are used to treat this problem. Surgery may help in very  bad cases.  Follow your doctor's instructions about wearing a splint, resting your wrist, keeping follow-up visits, and calling for help. This information is not intended to replace advice given to you by your health care provider. Make sure you discuss any questions you have with your health care provider. Document Revised: 12/20/2017 Document Reviewed: 12/20/2017 Elsevier Patient Education  [place].

## 2020-04-07 NOTE — Progress Notes (Signed)
NEW PROBLEM//OFFICE VISIT  Chief Complaint  Patient presents with  . 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  Neurological: Positive for tingling.  All other systems reviewed and are negative.    Past Medical History:  Diagnosis Date  . Abnormal vaginal Pap smear    Dr Glo Herring  . Allergic rhinitis   . BV (bacterial vaginosis) 02/09/2015  . Depression, recurrent ([place]) 07/31/2013   PHQ 9 score of 13 on 11/23/2013, score of 12 in 01/2018, not suicidal or homicidal  . Fibroids 10/28/2013  . Hyperlipidemia    x4 years   . Hypertension    whenshe wasa young but she changed diet and reducd salt intake   . Irregular intermenstrual bleeding 10/28/2013  . Kidney stones   . Mental disorder   . Unspecified symptom associated with female genital organs 10/28/2013  . Vaginal discharge 02/09/2015  . Vaginal odor 02/09/2015  . Vaginal Pap smear, abnormal     Past Surgical History:  Procedure Laterality Date  . ABDOMINAL HYSTERECTOMY N/A 06/04/2018   Procedure: TOTAL ABDOMINAL HYSTERECTOMY WITH LEFT OOPHORECTOMY;  Surgeon: Florian Buff, MD;  Location: [facility];  Service: Gynecology;  Laterality: N/A;  . BREAST REDUCTION SURGERY  Jan 2007  . CERVICAL BIOPSY  2010   for abnormal pap   . cyst removed from both breast , all benign both breast    . DILITATION & CURRETTAGE/HYSTROSCOPY WITH THERMACHOICE ABLATION N/A 12/15/2013   Procedure: DILATATION & CURETTAGE/HYSTEROSCOPY WITH THERMACHOICE ABLATION;  Surgeon: Jonnie Kind, MD;  Location: [facility];  Service: Gynecology;  Laterality: N/A;   total therapy time:29min 1sec D5W  89ml in, D5W  7ml out Temperature 87 degree c.  . EXCISION OF SKIN TAG Right 12/15/2013   Procedure: EXCISION OF SKIN TAG;  Surgeon: Jonnie Kind, MD;  Location: [facility];  Service: Gynecology;  Laterality: Right;  . LAPAROSCOPIC BILATERAL SALPINGECTOMY Bilateral 12/15/2013   Procedure: LAPAROSCOPIC BILATERAL SALPINGECTOMY;  Surgeon: Jonnie Kind, MD;  Location: [facility];  Service: Gynecology;  Laterality: Bilateral;  . REMOVAL OF NON VAGINAL CONTRACEPTIVE DEVICE Left 12/15/2013   Procedure: REMOVAL OF NON VAGINAL CONTRACEPTIVE [place];  Surgeon: Jonnie Kind, MD;  Location: [facility];  Service: Gynecology;  Laterality: Left;    Family History  Problem Relation Age of Onset  . Fibromyalgia Mother   . Hypertension Mother   . Thyroid disease Mother   . Arthritis Mother   . Alcohol abuse Father   . COPD Father   . Bipolar disorder Father   . Bipolar disorder Sister   . Anxiety disorder Sister        has panic attacks  . Bipolar disorder Brother   . Alcohol abuse Brother   . Alcohol abuse Brother   . Alcohol abuse Brother   . Other Brother        handicap  . Alcohol abuse Brother   . Diabetes Maternal Grandfather   . Other Paternal Grandfather        commited suicide   Social History   Tobacco Use  . Smoking status: Former Smoker  Packs/day: 0.20    Years: 20.00    Pack years: 4.00    Types: Cigarettes    Quit date: 06/05/2009    Years since quitting: 10.8  . Smokeless tobacco: Never Used  Vaping Use  . Vaping Use: Never used  Substance Use Topics  . Alcohol use: Yes    Comment: occasionally, couple drinks per mo  . Drug use: No    No Known Allergies  Current Meds  Medication Sig  . amLODipine (NORVASC) 5 MG tablet Take 1 tablet (5 mg total) by mouth daily.  Marland Kitchen aspirin-acetaminophen-caffeine (EXCEDRIN MIGRAINE) 250-250-65 MG tablet Take 1 tablet by mouth daily as needed for headache.  . busPIRone (BUSPAR) 5 MG tablet  TAKE 1 TABLET(5 MG) BY MOUTH TWICE DAILY  . clonazePAM (KLONOPIN) 1 MG tablet Take one tablet by mouth once daily as needed for anxiety and panic  . DULoxetine (CYMBALTA) 60 MG capsule Take 1 capsule (60 mg total) by mouth daily.  . lansoprazole (PREVACID) 30 MG capsule Take 1 capsule (30 mg total) by mouth 2 (two) times daily before a meal. Patient prefers this one and didn't want to take Protonix  . rosuvastatin (CRESTOR) 10 MG tablet TAKE 1 TABLET(10 MG) BY MOUTH DAILY    BP (!) 140/92   Pulse 98   Ht 5' (1.524 m)   Wt 148 lb (67.1 kg)   LMP 04/20/2018   BMI 28.90 kg/m   Physical Exam Constitutional:      General: She is not in acute distress.    Appearance: She is well-developed.  Cardiovascular:     Comments: No peripheral edema Skin:    General: Skin is warm and dry.  Neurological:     Mental Status: She is alert and oriented to person, place, and time.     Sensory: Sensory deficit present.     Coordination: Coordination normal.     Gait: Gait normal.     Deep Tendon Reflexes: Reflexes are normal and symmetric.     Ortho Exam  Right hand tenderness over the carpal tunnel skin is normal range of motion is normal wrist is stable muscle tone and strength are normal no atrophy  Sensory abnormalities are noted in the thumb index long and part of the ring finger  Left hand Skin is normal, tenderness over the carpal tunnel, range of motion normal, instability none, muscle tone normal, no atrophy.  Decreased sensation thumb index long ring finger  Positive carpal tunnel compression test immediately felt numbness and tingling increased  MEDICAL DECISION MAKING  A.  Encounter Diagnoses  Name Primary?  . Paresthesia of hand, bilateral Yes  . Bilateral carpal tunnel syndrome     B. DATA ANALYSED:    IMAGING: Independent interpretation of images: no  Orders: no  Outside records reviewed: no  C. MANAGEMENT  Recommended surgery.  She does not want surgery,  wants to know what else could be done.  I told her we could do injections but carpal tunnel releases 1 year after symptoms do not do as well.  She still opted for injection.  So she agreed to bilateral CTI  B6 100 mg bid x 6 weeks   Procedure note for right carpal tunnel injection  Right carpal tunnel syndrome Verbal consent was given for right carpal tunnel injection Timeout confirmed the injection site  The skin was prepped with alcohol in the skin was anesthetized with ethyl chloride  The carpal tunnel was injected with 40 mg of Depo-Medrol and 3  mL 1% lidocaine this was well tolerated there were no complications  Procedure note injection left thumb  Injection left CMC joint Medication Depo-Medrol 40 mg and lidocaine 1% 2 cc The patient gave verbal consent Timeout confirmed the site of injection left CMC joint  Alcohol and ethyl chloride was used to repair the skin and then a 25-gauge needle was used to inject the [facility] joint of the left thumb  There were no complications a sterile Band-Aid was applied  No orders of the defined types were placed in this encounter.     Arther Abbott, MD  04/07/2020 3:34 PM

## 2020-04-11 ENCOUNTER — Telehealth: Payer: Self-pay | Admitting: Physical Medicine and Rehabilitation

## 2020-04-11 NOTE — Telephone Encounter (Signed)
Se below. This patient was referred to Dr. Ernestina Patches for an EMG/ NCV. I did close the referral at the request of the patient.

## 2020-04-11 NOTE — Telephone Encounter (Signed)
Patient called. Says she is better. Cancel the referral for Dr. Ernestina Patches

## 2020-04-18 ENCOUNTER — Ambulatory Visit: Payer: Commercial Managed Care - PPO | Admitting: Orthopedic Surgery

## 2020-05-08 ENCOUNTER — Other Ambulatory Visit: Payer: Self-pay | Admitting: Family Medicine

## 2020-05-10 NOTE — Telephone Encounter (Signed)
Pt calling to follow up on this refill request.

## 2020-05-26 ENCOUNTER — Other Ambulatory Visit (HOSPITAL_COMMUNITY)
Admission: RE | Admit: 2020-05-26 | Discharge: 2020-05-26 | Disposition: A | Discharge status: Home / Self Care | Payer: Commercial Managed Care - PPO | Source: Ambulatory Visit | Attending: Family Medicine | Admitting: Family Medicine

## 2020-05-26 ENCOUNTER — Other Ambulatory Visit: Payer: Self-pay

## 2020-05-26 ENCOUNTER — Encounter: Payer: Self-pay | Admitting: Family Medicine

## 2020-05-26 ENCOUNTER — Ambulatory Visit (INDEPENDENT_AMBULATORY_CARE_PROVIDER_SITE_OTHER): Payer: Commercial Managed Care - PPO | Admitting: Family Medicine

## 2020-05-26 VITALS — BP 119/84 | HR 94 | Ht 60.0 in | Wt 145.0 lb

## 2020-05-26 DIAGNOSIS — E663 Overweight: Secondary | ICD-10-CM

## 2020-05-26 DIAGNOSIS — I10 Essential (primary) hypertension: Secondary | ICD-10-CM

## 2020-05-26 DIAGNOSIS — Z124 Encounter for screening for malignant neoplasm of cervix: Secondary | ICD-10-CM

## 2020-05-26 DIAGNOSIS — E785 Hyperlipidemia, unspecified: Secondary | ICD-10-CM | POA: Diagnosis not present

## 2020-05-26 DIAGNOSIS — Z131 Encounter for screening for diabetes mellitus: Secondary | ICD-10-CM | POA: Diagnosis not present

## 2020-05-26 DIAGNOSIS — Z0001 Encounter for general adult medical examination with abnormal findings: Secondary | ICD-10-CM

## 2020-05-26 DIAGNOSIS — Z0000 Encounter for general adult medical examination without abnormal findings: Secondary | ICD-10-CM

## 2020-05-26 MED ORDER — AMLODIPINE BESYLATE 2.5 MG PO TABS: 2.5000 mg | ORAL_TABLET | Freq: Every day | ORAL | 5 refills | Status: DC

## 2020-05-26 MED ORDER — AMLODIPINE-ATORVASTATIN 5-20 MG PO TABS: 1.0000 | ORAL_TABLET | Freq: Every day | ORAL | 5 refills | Status: DC

## 2020-05-26 NOTE — Patient Instructions (Addendum)
F/u in office with MD in office f/u chronic problems in 6 months, call if you need me before  Pap today  NEW medications are amlodipine/ atorvastatin 5/20 ONE daily for blood pressure andn cholesterol Continue amlodipine 2.5 mg  ONE daily STOP crestor, since starting the atorvastatin  Fasting lipid. cmp and EGFr, hBA1C 5 days before next visit ([facility])  Start OTC vitamin D3,  2000 IU once daily  Need to reduce fried food, peanut  Butter and limit nuts  It is important that you exercise regularly at least 30 minutes 5 times a week. If you develop chest pain, have severe difficulty breathing, or feel very tired, stop exercising immediately and seek medical attention   Think about what you will eat, plan ahead. Choose " clean, green, fresh or frozen" over canned, processed or packaged foods which are more sugary, salty and fatty. 70 to 75% of food eaten should be vegetables and fruit. Three meals at set times with snacks allowed between meals, but they must be fruit or vegetables. Aim to eat over a 12 hour period , example 7 am to 7 pm, and STOP after  your last meal of the day. Drink water,generally about 64 ounces per day, no other drink is as healthy. Fruit juice is best enjoyed in a healthy way, by EATING the fruit.    Cholesterol Content in Foods Cholesterol is a waxy, fat-like substance that helps to carry fat in the blood. The body needs cholesterol in small amounts, but too much cholesterol can cause damage to the arteries and heart. Most people should eat less than 200 milligrams (mg) of cholesterol a day. Foods with cholesterol  Cholesterol is found in animal-based foods, such as meat, seafood, and dairy. Generally, low-fat dairy and lean meats have less cholesterol than full-fat dairy and fatty meats. The milligrams of cholesterol per serving (mg per serving) of common cholesterol-containing foods are listed below. Meat and other proteins  Egg -- one large whole egg has 186  mg.  Veal shank -- 4 oz has 141 mg.  Lean ground [place] (93% lean) -- 4 oz has 118 mg.  Fat-trimmed lamb loin -- 4 oz has 106 mg.  Lean ground beef (90% lean) -- 4 oz has 100 mg.  Lobster -- 3.5 oz has 90 mg.  Pork loin chops -- 4 oz has 86 mg.  Canned salmon -- 3.5 oz has 83 mg.  Fat-trimmed beef top loin -- 4 oz has 78 mg.  Frankfurter -- 1 frank (3.5 oz) has 77 mg.  Crab -- 3.5 oz has 71 mg.  Roasted chicken without skin, white meat -- 4 oz has 66 mg.  Light bologna -- 2 oz has 45 mg.  Deli-cut [place] -- 2 oz has 31 mg.  Canned tuna -- 3.5 oz has 31 mg.  Berniece Salines -- 1 oz has 29 mg.  Oysters and mussels (raw) -- 3.5 oz has 25 mg.  Mackerel -- 1 oz has 22 mg.  Trout -- 1 oz has 20 mg.  Pork sausage -- 1 link (1 oz) has 17 mg.  Salmon -- 1 oz has 16 mg.  Tilapia -- 1 oz has 14 mg. Dairy  Soft-serve ice cream --  cup (4 oz) has 103 mg.  Whole-milk yogurt -- 1 cup (8 oz) has 29 mg.  Cheddar cheese -- 1 oz has 28 mg.  American cheese -- 1 oz has 28 mg.  Whole milk -- 1 cup (8 oz) has 23 mg.  2%  milk -- 1 cup (8 oz) has 18 mg.  Cream cheese -- 1 tablespoon (Tbsp) has 15 mg.  Cottage cheese --  cup (4 oz) has 14 mg.  Low-fat (1%) milk -- 1 cup (8 oz) has 10 mg.  Sour cream -- 1 Tbsp has 8.5 mg.  Low-fat yogurt -- 1 cup (8 oz) has 8 mg.  Nonfat Greek yogurt -- 1 cup (8 oz) has 7 mg.  Half-and-half cream -- 1 Tbsp has 5 mg. Fats and oils  Cod liver oil -- 1 tablespoon (Tbsp) has 82 mg.  Butter -- 1 Tbsp has 15 mg.  Lard -- 1 Tbsp has 14 mg.  Bacon grease -- 1 Tbsp has 14 mg.  Mayonnaise -- 1 Tbsp has 5-10 mg.  Margarine -- 1 Tbsp has 3-10 mg. Exact amounts of cholesterol in these foods may vary depending on specific ingredients and brands. Foods without cholesterol Most plant-based foods do not have cholesterol unless you combine them with a food that has cholesterol. Foods without cholesterol include:  Grains and  cereals.  Vegetables.  Fruits.  Vegetable oils, such as olive, canola, and sunflower oil.  Legumes, such as peas, beans, and lentils.  Nuts and seeds.  Egg whites. Summary  The body needs cholesterol in small amounts, but too much cholesterol can cause damage to the arteries and heart.  Most people should eat less than 200 milligrams (mg) of cholesterol a day. This information is not intended to replace advice given to you by your health care provider. Make sure you discuss any questions you have with your health care provider. Document Revised: 07/26/2017 Document Reviewed: 04/09/2017 Elsevier Patient Education  [place].

## 2020-05-26 NOTE — Progress Notes (Signed)
[name] [name]     MRN: [id number]      DOB: [birth year]  HPI: Patient is in for annual physical exam. Blood pressure management is also addressed  are expressed or addressed at the visit. Recent labs,  are reviewed. Immunization is reviewed , and  updated if needed.She will get her flu vaccine on the job   PE: BP 119/84   Pulse 94   Ht 5' (1.524 m)   Wt 145 lb (65.8 kg)   LMP 04/20/2018   SpO2 97%   BMI 28.32 kg/m   Pleasant  female, alert and oriented x 3, in no cardio-pulmonary distress. Afebrile. HEENT No facial trauma or asymetry. Sinuses non tender.  Extra occullar muscles intact.. External ears normal, . Neck: supple, no adenopathy,JVD or thyromegaly.No bruits.  Chest: Clear to ascultation bilaterally.No crackles or wheezes. Non tender to palpation  Breast: No asymetry,no masses or lumps. No tenderness. No nipple discharge or inversion. No axillary or supraclavicular adenopathy  Cardiovascular system; Heart sounds normal,  S1 and  S2 ,no S3.  No murmur, or thrill. Apical beat not displaced Peripheral pulses normal.  Abdomen: Soft, non tender, no organomegaly or masses. No bruits. Bowel sounds normal. No guarding, tenderness or rebound.   GU: External genitalia normal female genitalia , normal female distribution of hair. No lesions. Urethral meatus normal in size, no  Prolapse, no lesions visibly  Present. Bladder non tender. Vagina pink and moist , with no visible lesions , discharge present . Adequate pelvic support no  cystocele or rectocele noted  Uterus absent , no adnexal masses, no adnexal tenderness.   Musculoskeletal exam: Full ROM of spine, hips , shoulders and knees. No deformity ,swelling or crepitus noted. No muscle wasting or atrophy.   Neurologic: Cranial nerves 2 to 12 intact. Power, tone ,sensation and reflexes normal throughout. No disturbance in gait. No tremor.  Skin: Intact, no ulceration, erythema , scaling or  rash noted. Pigmentation normal throughout  Psych; Normal mood and affect. Judgement and concentration normal   Assessment & Plan:  Annual physical exam Annual exam as documented. Counseling done  re healthy lifestyle involving commitment to 150 minutes exercise per week, heart healthy diet, and attaining healthy weight.The importance of adequate sleep also discussed. Regular seat belt use and home safety, is also discussed. Changes in health habits are decided on by the patient with goals and time frames  set for achieving them. Immunization and cancer screening needs are specifically addressed at this visit.   Benign essential HTN Controlled, chnged from amlodipoine 5 mg to amlodipine/ caduet 5/20mg  one daily DASH diet and commitment to daily physical activity for a minimum of 30 minutes discussed and encouraged, as a part of hypertension management. The importance of attaining a healthy weight is also discussed.  BP/Weight 05/26/2020 04/07/2020 03/14/2020 07/23/2019 07/08/2019 06/09/2019 58/12/2776  Systolic BP 242 353 614 431 540 086 761  Diastolic BP 84 92 92 950 932 94 93  Wt. (Lbs) 145 148 148.12 134 138 142 140  BMI 28.32 28.9 28.93 26.17 26.95 27.73 27.34  Some encounter information is confidential and restricted. Go to Review Flowsheets activity to see all data.       Hyperlipidemia LDL goal <100 Not at goal, dose increase in medication and needs to lower fried and fatty foods  Overweight (BMI 25.0-29.9)  Patient re-educated about  the importance of commitment to a  minimum of 150 minutes of exercise per week as able.  The importance of  healthy food choices with portion control discussed, as well as eating regularly and within a 12 hour window most days. The need to choose "clean , green" food 50 to 75% of the time is discussed, as well as to make water the primary drink and set a goal of 64 ounces water daily.    Weight /BMI 05/26/2020 04/07/2020 03/14/2020  WEIGHT 145  lb 148 lb 148 lb 1.9 oz  HEIGHT 5\' 0"  5\' 0"  5\' 0"   BMI 28.32 kg/m2 28.9 kg/m2 28.93 kg/m2  Some encounter information is confidential and restricted. Go to Review Flowsheets activity to see all data.

## 2020-05-27 ENCOUNTER — Encounter: Payer: Self-pay | Admitting: Family Medicine

## 2020-05-27 LAB — CYTOLOGY - PAP
Comment: NEGATIVE
Diagnosis: NEGATIVE
High risk HPV: NEGATIVE

## 2020-05-27 NOTE — Assessment & Plan Note (Signed)
Controlled, chnged from amlodipoine 5 mg to amlodipine/ caduet 5/20mg  one daily DASH diet and commitment to daily physical activity for a minimum of 30 minutes discussed and encouraged, as a part of hypertension management. The importance of attaining a healthy weight is also discussed.  BP/Weight 05/26/2020 04/07/2020 03/14/2020 07/23/2019 07/08/2019 06/09/2019 94/08/2902  Systolic BP 753 391 792 178 375 423 702  Diastolic BP 84 92 92 301 720 94 93  Wt. (Lbs) 145 148 148.12 134 138 142 140  BMI 28.32 28.9 28.93 26.17 26.95 27.73 27.34  Some encounter information is confidential and restricted. Go to Review Flowsheets activity to see all data.

## 2020-05-27 NOTE — Assessment & Plan Note (Signed)
Not at goal, dose increase in medication and needs to lower fried and fatty foods

## 2020-05-27 NOTE — Assessment & Plan Note (Signed)

## 2020-05-27 NOTE — Assessment & Plan Note (Signed)
  Patient re-educated about  the importance of commitment to a  minimum of 150 minutes of exercise per week as able.  The importance of healthy food choices with portion control discussed, as well as eating regularly and within a 12 hour window most days. The need to choose "clean , green" food 50 to 75% of the time is discussed, as well as to make water the primary drink and set a goal of 64 ounces water daily.    Weight /BMI 05/26/2020 04/07/2020 03/14/2020  WEIGHT 145 lb 148 lb 148 lb 1.9 oz  HEIGHT 5\' 0"  5\' 0"  5\' 0"   BMI 28.32 kg/m2 28.9 kg/m2 28.93 kg/m2  Some encounter information is confidential and restricted. Go to Review Flowsheets activity to see all data.

## 2020-06-01 ENCOUNTER — Telehealth: Payer: Self-pay

## 2020-06-01 NOTE — Telephone Encounter (Signed)
Pt is calling she need a small prescription of the Rovastatin, until we get a Auth Completed.,     Also the pt had a Flu Shot at [facility] 05-31-20

## 2020-06-03 NOTE — Telephone Encounter (Signed)
Left message for patient to call back  

## 2020-06-03 NOTE — Telephone Encounter (Signed)
Spoke with patient. Caduet needs PA. Patient understands we may not be able to work on it until Monday.

## 2020-06-06 ENCOUNTER — Other Ambulatory Visit: Payer: Self-pay | Admitting: Family Medicine

## 2020-06-07 NOTE — Telephone Encounter (Signed)
Pt was taken off of rosuvastatin prior Josem Kaufmann has been submitted for insurance. What would you like to do in the mean time

## 2020-06-07 NOTE — Telephone Encounter (Signed)
Pt is calling she need a small prescription of the Rovastatin, until we get a Auth Completed   Also the pt had a Flu Shot at [facility] 05-31-20

## 2020-06-07 NOTE — Telephone Encounter (Signed)
Can we push to get the caduet approved, if she can get this within this week, then no need to send in an additional/new script for another  statin, caduet is a combination of blood pressure med and a statin. Pls  explain this to her and let me kno if there are problems  getting the PA through

## 2020-06-08 NOTE — Telephone Encounter (Signed)
FYI Pt notified of recommendation I have prior authorized this medication due to insurance it had to be faxed. Let her know that I would check back if we havent heard by Friday as they said 24 to 48 hours I would call the insurance company back. Contact code : Contact plan to follow up on [id number] This is from [place]

## 2020-06-10 ENCOUNTER — Encounter: Payer: Self-pay | Admitting: Physical Medicine and Rehabilitation

## 2020-06-10 ENCOUNTER — Ambulatory Visit: Payer: Commercial Managed Care - PPO | Admitting: Physical Medicine and Rehabilitation

## 2020-06-10 ENCOUNTER — Other Ambulatory Visit: Payer: Self-pay

## 2020-06-10 DIAGNOSIS — R202 Paresthesia of skin: Secondary | ICD-10-CM

## 2020-06-10 NOTE — Progress Notes (Signed)
Pain in bilateral hands- first and second fingers. Thumbs are the worst. Pain improved after injections. Numbness in fingers. Right hand dominant No lotion per patient Numeric Pain Rating Scale and Functional Assessment Average Pain 6   In the last MONTH (on 0-10 scale) has pain interfered with the following?  1. General activity like being  able to carry out your everyday physical activities such as walking, climbing stairs, carrying groceries, or moving a chair?  Rating(6)

## 2020-06-14 ENCOUNTER — Other Ambulatory Visit: Payer: Self-pay

## 2020-06-14 ENCOUNTER — Encounter: Payer: Commercial Managed Care - PPO | Admitting: Family Medicine

## 2020-06-14 ENCOUNTER — Telehealth: Payer: Self-pay

## 2020-06-14 ENCOUNTER — Other Ambulatory Visit: Payer: Self-pay | Admitting: *Deleted

## 2020-06-14 MED ORDER — ATORVASTATIN CALCIUM 20 MG PO TABS: 20.0000 mg | ORAL_TABLET | Freq: Every day | ORAL | 5 refills | Status: DC

## 2020-06-14 NOTE — Progress Notes (Signed)
[name] [name] - 49 y.o. female MRN [id number]  Date of birth: 13-Feb-1971  Office Visit Note: Visit Date: 06/10/2020 PCP: Fayrene Helper, MD Referred by: Fayrene Helper, MD  Subjective: Chief Complaint  Patient presents with  . Right Hand - Pain, Numbness  . Left Hand - Pain, Numbness   HPI:  [name] [name] is a 49 y.o. female who comes in today at the request of Dr. Arther Abbott for electrodiagnostic study of the Bilateral upper extremities.  Patient is Right hand dominant.  She reports a 1 year history of progressive worsening bilateral hand pain with numbness and tingling particularly in the first and second digits with the thumbs being the worst.  She has carried a diagnosis of carpal tunnel syndrome but has not had prior electrodiagnostic studies.  She has been treated with hand bracing that does seem to help.  She reports having injections performed by Dr. Aline Brochure that did seem to help.  She reports her pain is a 6 out of 10.  She denies any frank radicular symptoms.  She is not treated for diabetes.   ROS Otherwise per HPI.  Assessment & Plan: Visit Diagnoses:  1. Paresthesia of skin     Plan: Impression: The above electrodiagnostic study is ABNORMAL and reveals evidence of:  1.  A moderate right median nerve entrapment at the wrist (carpal tunnel syndrome) affecting sensory and motor components.    2.  A mild to moderate left median nerve entrapment at the wrist (carpal tunnel syndrome) affecting sensory components.   There is no significant electrodiagnostic evidence of any other focal nerve entrapment, brachial plexopathy or cervical radiculopathy.    Recommendations: 1.  Follow-up with referring physician. 2.  Continue current management of symptoms. 3.  Continue use of resting splint at night-time and as needed during the day. 4.  Suggest surgical evaluation.  Meds & Orders: No orders of the defined types were placed in this encounter.     Orders Placed This Encounter  Procedures  . NCV with EMG (electromyography)    Follow-up: Return for Arther Abbott, MD.   Procedures: No procedures performed  EMG & NCV Findings: Evaluation of the right median motor nerve showed prolonged distal onset latency (4.5 ms) and decreased conduction velocity (Elbow-Wrist, 41 m/s).  The left median (across palm) sensory nerve showed no response (Palm) and prolonged distal peak latency (3.8 ms).  The right median (across palm) sensory nerve showed prolonged distal peak latency (Wrist, 4.4 ms) and prolonged distal peak latency (Palm, 3.8 ms).  All remaining nerves (as indicated in the following tables) were within normal limits.  Left vs. Right side comparison data for the median motor nerve indicates abnormal L-R velocity difference (Elbow-Wrist, 10 m/s).    All examined muscles (as indicated in the following table) showed no evidence of electrical instability.    Impression: The above electrodiagnostic study is ABNORMAL and reveals evidence of:  1.  A moderate right median nerve entrapment at the wrist (carpal tunnel syndrome) affecting sensory and motor components.    2.  A mild to moderate left median nerve entrapment at the wrist (carpal tunnel syndrome) affecting sensory components.   There is no significant electrodiagnostic evidence of any other focal nerve entrapment, brachial plexopathy or cervical radiculopathy.    Recommendations: 1.  Follow-up with referring physician. 2.  Continue current management of symptoms. 3.  Continue use of resting splint at night-time and as needed during the day. 4.  Suggest surgical  evaluation.  ___________________________ Laurence Spates FAAPMR Board Certified, American Board of Physical Medicine and Rehabilitation    Nerve Conduction Studies Anti Sensory Summary Table   Stim Site NR Peak (ms) Norm Peak (ms) P-T Amp (V) Norm P-T Amp Site1 Site2 Delta-P (ms) Dist (cm) Vel (m/s) Norm Vel (m/s)   Left Median Acr Palm Anti Sensory (2nd Digit)  30C  Wrist    *3.8 <3.6 22.0 >10 Wrist Palm  0.0    Palm *NR  <2.0          Right Median Acr Palm Anti Sensory (2nd Digit)  30.8C  Wrist    *4.4 <3.6 23.2 >10 Wrist Palm 0.6 0.0    Palm    *3.8 <2.0 24.9         Right Radial Anti Sensory (Base 1st Digit)  30.9C  Wrist    2.0 <3.1 38.7  Wrist Base 1st Digit 2.0 0.0    Right Ulnar Anti Sensory (5th Digit)  31.4C  Wrist    3.0 <3.7 28.2 >15.0 Wrist 5th Digit 3.0 14.0 47 >38   Motor Summary Table   Stim Site NR Onset (ms) Norm Onset (ms) O-P Amp (mV) Norm O-P Amp Site1 Site2 Delta-0 (ms) Dist (cm) Vel (m/s) Norm Vel (m/s)  Left Median Motor (Abd Poll Brev)  31.6C  Wrist    4.2 <4.2 8.9 >5 Elbow Wrist 3.7 19.0 51 >50  Elbow    7.9  6.1         Right Median Motor (Abd Poll Brev)  31C    Martin-Gruber  Wrist    *4.5 <4.2 8.2 >5 Elbow Wrist 4.8 19.5 *41 >50  Elbow    9.3  7.5         Right Ulnar Motor (Abd Dig Min)  31.3C  Wrist    2.5 <4.2 11.1 >3 B Elbow Wrist 3.0 18.0 60 >53  B Elbow    5.5  9.4  A Elbow B Elbow 1.4 10.0 71 >53  A Elbow    6.9  9.2          EMG   Side Muscle Nerve Root Ins Act Fibs Psw Amp Dur Poly Recrt Int Fraser Din Comment  Right Abd Poll Brev Median C8-T1 Nml Nml Nml Nml Nml 0 Nml Nml   Right 1stDorInt Ulnar C8-T1 Nml Nml Nml Nml Nml 0 Nml Nml   Right PronatorTeres Median C6-7 Nml Nml Nml Nml Nml 0 Nml Nml   Right Biceps Musculocut C5-6 Nml Nml Nml Nml Nml 0 Nml Nml   Right Deltoid Axillary C5-6 Nml Nml Nml Nml Nml 0 Nml Nml     Nerve Conduction Studies Anti Sensory Left/Right Comparison   Stim Site L Lat (ms) R Lat (ms) L-R Lat (ms) L Amp (V) R Amp (V) L-R Amp (%) Site1 Site2 L Vel (m/s) R Vel (m/s) L-R Vel (m/s)  Median Acr Palm Anti Sensory (2nd Digit)  30C  Wrist *3.8 *4.4 0.6 22.0 23.2 5.2 Wrist Palm     Palm  *3.8   24.9        Radial Anti Sensory (Base 1st Digit)  30.9C  Wrist  2.0   38.7  Wrist Base 1st Digit     Ulnar Anti Sensory (5th Digit)   31.4C  Wrist  3.0   28.2  Wrist 5th Digit  47    Motor Left/Right Comparison   Stim Site L Lat (ms) R Lat (ms) L-R Lat (ms) L Amp (mV) R Amp (mV)  L-R Amp (%) Site1 Site2 L Vel (m/s) R Vel (m/s) L-R Vel (m/s)  Median Motor (Abd Poll Brev)  31.6C  Wrist 4.2 *4.5 0.3 8.9 8.2 7.9 Elbow Wrist 51 *41 *10  Elbow 7.9 9.3 1.4 6.1 7.5 18.7       Ulnar Motor (Abd Dig Min)  31.3C  Wrist  2.5   11.1  B Elbow Wrist  60   B Elbow  5.5   9.4  A Elbow B Elbow  71   A Elbow  6.9   9.2           Waveforms:                 Clinical History: No specialty comments available.     Objective:  VS:  HT:    WT:   BMI:     BP:   HR: bpm  TEMP: ( )  RESP:  Physical Exam Musculoskeletal:        General: No swelling, tenderness or deformity.     Comments: Inspection reveals no atrophy of the bilateral APB or FDI or hand intrinsics. There is no swelling, color changes, allodynia or dystrophic changes. There is 5 out of 5 strength in the bilateral wrist extension, finger abduction and long finger flexion. There is intact sensation to light touch in all dermatomal and peripheral nerve distributions.  is a negative Tinel's test at the bilateral wrist and elbow. There is a negative Phalen's test bilaterally. There is a negative Hoffmann's test bilaterally.  Skin:    General: Skin is warm and dry.     Findings: No erythema or rash.  Neurological:     General: No focal deficit present.     Mental Status: She is alert and oriented to person, place, and time.     Motor: No weakness or abnormal muscle tone.     Coordination: Coordination normal.  Psychiatric:        Mood and Affect: Mood normal.        Behavior: Behavior normal.      Imaging: No results found.

## 2020-06-14 NOTE — Telephone Encounter (Signed)
Yes that is the correct dose of all her medication Amlodipine 51m g plus amlodipine 2.5 mg plus atorvastatin 20 mg , please send in all 3 for next 6 months, and let her know, thanks

## 2020-06-14 NOTE — Telephone Encounter (Signed)
The PA on the caduet was denied because they states there is no reason the patient cannot take the 2 meds separate (which they do cover. Do you want me to send in amlodipine 5 and atorvastatin 20 and just tell her to continue the amlodipine 2.5 as well. Please advise

## 2020-06-14 NOTE — Procedures (Signed)
EMG & NCV Findings: Evaluation of the right median motor nerve showed prolonged distal onset latency (4.5 ms) and decreased conduction velocity (Elbow-Wrist, 41 m/s).  The left median (across palm) sensory nerve showed no response (Palm) and prolonged distal peak latency (3.8 ms).  The right median (across palm) sensory nerve showed prolonged distal peak latency (Wrist, 4.4 ms) and prolonged distal peak latency (Palm, 3.8 ms).  All remaining nerves (as indicated in the following tables) were within normal limits.  Left vs. Right side comparison data for the median motor nerve indicates abnormal L-R velocity difference (Elbow-Wrist, 10 m/s).    All examined muscles (as indicated in the following table) showed no evidence of electrical instability.    Impression: The above electrodiagnostic study is ABNORMAL and reveals evidence of:  1.  A moderate right median nerve entrapment at the wrist (carpal tunnel syndrome) affecting sensory and motor components.    2.  A mild to moderate left median nerve entrapment at the wrist (carpal tunnel syndrome) affecting sensory components.   There is no significant electrodiagnostic evidence of any other focal nerve entrapment, brachial plexopathy or cervical radiculopathy.    Recommendations: 1.  Follow-up with referring physician. 2.  Continue current management of symptoms. 3.  Continue use of resting splint at night-time and as needed during the day. 4.  Suggest surgical evaluation.  ___________________________ Laurence Spates FAAPMR Board Certified, American Board of Physical Medicine and Rehabilitation    Nerve Conduction Studies Anti Sensory Summary Table   Stim Site NR Peak (ms) Norm Peak (ms) P-T Amp (V) Norm P-T Amp Site1 Site2 Delta-P (ms) Dist (cm) Vel (m/s) Norm Vel (m/s)  Left Median Acr Palm Anti Sensory (2nd Digit)  30C  Wrist    *3.8 <3.6 22.0 >10 Wrist Palm  0.0    Palm *NR  <2.0          Right Median Acr Palm Anti Sensory (2nd Digit)   30.8C  Wrist    *4.4 <3.6 23.2 >10 Wrist Palm 0.6 0.0    Palm    *3.8 <2.0 24.9         Right Radial Anti Sensory (Base 1st Digit)  30.9C  Wrist    2.0 <3.1 38.7  Wrist Base 1st Digit 2.0 0.0    Right Ulnar Anti Sensory (5th Digit)  31.4C  Wrist    3.0 <3.7 28.2 >15.0 Wrist 5th Digit 3.0 14.0 47 >38   Motor Summary Table   Stim Site NR Onset (ms) Norm Onset (ms) O-P Amp (mV) Norm O-P Amp Site1 Site2 Delta-0 (ms) Dist (cm) Vel (m/s) Norm Vel (m/s)  Left Median Motor (Abd Poll Brev)  31.6C  Wrist    4.2 <4.2 8.9 >5 Elbow Wrist 3.7 19.0 51 >50  Elbow    7.9  6.1         Right Median Motor (Abd Poll Brev)  31C    Martin-Gruber  Wrist    *4.5 <4.2 8.2 >5 Elbow Wrist 4.8 19.5 *41 >50  Elbow    9.3  7.5         Right Ulnar Motor (Abd Dig Min)  31.3C  Wrist    2.5 <4.2 11.1 >3 B Elbow Wrist 3.0 18.0 60 >53  B Elbow    5.5  9.4  A Elbow B Elbow 1.4 10.0 71 >53  A Elbow    6.9  9.2          EMG   Side Muscle Nerve Root Ins Act  Fibs Psw Amp Dur Poly Recrt Int Fraser Din Comment  Right Abd Poll Brev Median C8-T1 Nml Nml Nml Nml Nml 0 Nml Nml   Right 1stDorInt Ulnar C8-T1 Nml Nml Nml Nml Nml 0 Nml Nml   Right PronatorTeres Median C6-7 Nml Nml Nml Nml Nml 0 Nml Nml   Right Biceps Musculocut C5-6 Nml Nml Nml Nml Nml 0 Nml Nml   Right Deltoid Axillary C5-6 Nml Nml Nml Nml Nml 0 Nml Nml     Nerve Conduction Studies Anti Sensory Left/Right Comparison   Stim Site L Lat (ms) R Lat (ms) L-R Lat (ms) L Amp (V) R Amp (V) L-R Amp (%) Site1 Site2 L Vel (m/s) R Vel (m/s) L-R Vel (m/s)  Median Acr Palm Anti Sensory (2nd Digit)  30C  Wrist *3.8 *4.4 0.6 22.0 23.2 5.2 Wrist Palm     Palm  *3.8   24.9        Radial Anti Sensory (Base 1st Digit)  30.9C  Wrist  2.0   38.7  Wrist Base 1st Digit     Ulnar Anti Sensory (5th Digit)  31.4C  Wrist  3.0   28.2  Wrist 5th Digit  47    Motor Left/Right Comparison   Stim Site L Lat (ms) R Lat (ms) L-R Lat (ms) L Amp (mV) R Amp (mV) L-R Amp (%) Site1 Site2 L  Vel (m/s) R Vel (m/s) L-R Vel (m/s)  Median Motor (Abd Poll Brev)  31.6C  Wrist 4.2 *4.5 0.3 8.9 8.2 7.9 Elbow Wrist 51 *41 *10  Elbow 7.9 9.3 1.4 6.1 7.5 18.7       Ulnar Motor (Abd Dig Min)  31.3C  Wrist  2.5   11.1  B Elbow Wrist  60   B Elbow  5.5   9.4  A Elbow B Elbow  71   A Elbow  6.9   9.2           Waveforms:

## 2020-06-14 NOTE — Telephone Encounter (Signed)
Patient aware and already has amlodipine 2.5mg  and 5mg  and atorvastatin sent in

## 2020-06-16 ENCOUNTER — Encounter (INDEPENDENT_AMBULATORY_CARE_PROVIDER_SITE_OTHER): Payer: Self-pay | Admitting: *Deleted

## 2020-06-16 ENCOUNTER — Encounter (INDEPENDENT_AMBULATORY_CARE_PROVIDER_SITE_OTHER): Payer: Self-pay | Admitting: Gastroenterology

## 2020-06-16 ENCOUNTER — Other Ambulatory Visit: Payer: Self-pay

## 2020-06-16 ENCOUNTER — Telehealth (INDEPENDENT_AMBULATORY_CARE_PROVIDER_SITE_OTHER): Payer: Self-pay | Admitting: *Deleted

## 2020-06-16 ENCOUNTER — Ambulatory Visit (INDEPENDENT_AMBULATORY_CARE_PROVIDER_SITE_OTHER): Payer: Commercial Managed Care - PPO | Admitting: Gastroenterology

## 2020-06-16 VITALS — BP 122/78 | HR 96 | Temp 98.5°F | Ht 60.0 in | Wt 145.9 lb

## 2020-06-16 DIAGNOSIS — Z1211 Encounter for screening for malignant neoplasm of colon: Secondary | ICD-10-CM

## 2020-06-16 DIAGNOSIS — K5900 Constipation, unspecified: Secondary | ICD-10-CM | POA: Diagnosis not present

## 2020-06-16 DIAGNOSIS — Z7189 Other specified counseling: Secondary | ICD-10-CM | POA: Diagnosis not present

## 2020-06-16 MED ORDER — SUTAB 1479-225-188 MG PO TABS: 1.0000 | ORAL_TABLET | Freq: Once | ORAL | 0 refills | Status: AC

## 2020-06-16 NOTE — Telephone Encounter (Signed)
Patient needs Sutab (copay card) ° °

## 2020-06-16 NOTE — Progress Notes (Signed)
Maylon Peppers, M.D. Gastroenterology & Hepatology [facility] [address] [place], [place] [address] Primary Care Physician: Fayrene Helper, MD [address], [place] [address]  Referring MD: PCP  I will communicate my assessment and recommendations to the referring MD via EMR. Note: Occasional unusual wording and randomly placed punctuation marks may result from the use of speech recognition technology to transcribe this document"  Chief Complaint:  Constipation  History of Present Illness: [name] [name] is a 49 y.o. female with PMH anxiety, HTN, and HLD, who presents for evaluation of constipation.  Patient states for the last year she has had issues with constipation, usually had a BM every 2 weeks. She used to have a BM every day. She is currently using Senna two tablets every night, which makes her have a BM every day. She noticed only once blood in her stool but this has not happened for many months. The patient denies having any nausea, vomiting, fever, chills, hematochezia, melena, hematemesis, abdominal distention, abdominal pain, diarrhea, jaundice, pruritus or weight loss.  She states she mangesium citrate when she is too constipated but this is infrequent.  She drinks a lot of water, but does not eat prunes or kiwi.  Last EGD: 2012 - by Dr. Oneida Alar, had dyspepsia Last Colonoscopy: states in 2012, it was normal  FHx: neg for any gastrointestinal/liver disease, no malignancies Social: neg smoking, alcohol or illicit drug use Surgical:partial hysterectomy  Past Medical History: Past Medical History:  Diagnosis Date  . Abnormal vaginal Pap smear    Dr Glo Herring  . Allergic rhinitis   . Anxiety    Phreesia 05/25/2020  . BV (bacterial vaginosis) 02/09/2015  . Depression    Phreesia 05/25/2020  . Depression, recurrent ([place]) 07/31/2013   PHQ 9 score of 13 on 11/23/2013, score of 12 in 01/2018,  not suicidal or homicidal  . Fibroids 10/28/2013  . Hyperlipidemia    x4 years   . Hypertension    whenshe wasa young but she changed diet and reducd salt intake   . Irregular intermenstrual bleeding 10/28/2013  . Kidney stones   . Mental disorder   . Unspecified symptom associated with female genital organs 10/28/2013  . Vaginal discharge 02/09/2015  . Vaginal odor 02/09/2015  . Vaginal Pap smear, abnormal     Past Surgical History: Past Surgical History:  Procedure Laterality Date  . ABDOMINAL HYSTERECTOMY N/A 06/04/2018   Procedure: TOTAL ABDOMINAL HYSTERECTOMY WITH LEFT OOPHORECTOMY;  Surgeon: Florian Buff, MD;  Location: [facility];  Service: Gynecology;  Laterality: N/A;  . BREAST REDUCTION SURGERY  Jan 2007  . CERVICAL BIOPSY  2010   for abnormal pap   . cyst removed from both breast , all benign both breast    . DILITATION & CURRETTAGE/HYSTROSCOPY WITH THERMACHOICE ABLATION N/A 12/15/2013   Procedure: DILATATION & CURETTAGE/HYSTEROSCOPY WITH THERMACHOICE ABLATION;  Surgeon: Jonnie Kind, MD;  Location: [facility];  Service: Gynecology;  Laterality: N/A;  total therapy time:24min 1sec D5W  64ml in, D5W  25ml out Temperature 87 degree c.  . EXCISION OF SKIN TAG Right 12/15/2013   Procedure: EXCISION OF SKIN TAG;  Surgeon: Jonnie Kind, MD;  Location: [facility];  Service: Gynecology;  Laterality: Right;  . LAPAROSCOPIC BILATERAL SALPINGECTOMY Bilateral 12/15/2013   Procedure: LAPAROSCOPIC BILATERAL SALPINGECTOMY;  Surgeon: Jonnie Kind, MD;  Location: [facility];  Service: Gynecology;  Laterality: Bilateral;  . REMOVAL OF NON VAGINAL CONTRACEPTIVE DEVICE  Left 12/15/2013   Procedure: REMOVAL OF NON VAGINAL CONTRACEPTIVE DEVICE-IMPLANON;  Surgeon: Jonnie Kind, MD;  Location: [facility];  Service: Gynecology;  Laterality: Left;    Family History: Family History  Problem Relation Age of Onset  . Fibromyalgia Mother   . Hypertension Mother   . Thyroid disease Mother   . Arthritis Mother   .  Alcohol abuse Father   . COPD Father   . Bipolar disorder Father   . Bipolar disorder Sister   . Anxiety disorder Sister        has panic attacks  . Bipolar disorder Brother   . Alcohol abuse Brother   . Alcohol abuse Brother   . Alcohol abuse Brother   . Other Brother        handicap  . Alcohol abuse Brother   . Diabetes Maternal Grandfather   . Other Paternal Grandfather        commited suicide    Social History: Social History   Tobacco Use  Smoking Status Former Smoker  . Packs/day: 0.20  . Years: 20.00  . Pack years: 4.00  . Types: Cigarettes  . Quit date: 06/05/2009  . Years since quitting: 11.0  Smokeless Tobacco Never Used   Social History   Substance and Sexual Activity  Alcohol Use Yes   Comment: occasionally, couple drinks per mo   Social History   Substance and Sexual Activity  Drug Use No    Allergies: No Known Allergies  Medications: Current Outpatient Medications  Medication Sig Dispense Refill  . amLODipine (NORVASC) 2.5 MG tablet Take 1 tablet (2.5 mg total) by mouth daily. 30 tablet 5  . amLODipine-atorvastatin (CADUET) 5-20 MG tablet Take 1 tablet by mouth daily. 30 tablet 5  . aspirin-acetaminophen-caffeine (EXCEDRIN MIGRAINE) 250-250-65 MG tablet Take 1 tablet by mouth daily as needed for headache.    Marland Kitchen atorvastatin (LIPITOR) 20 MG tablet Take 1 tablet (20 mg total) by mouth daily. 30 tablet 5  . busPIRone (BUSPAR) 5 MG tablet TAKE 1 TABLET(5 MG) BY MOUTH TWICE DAILY 60 tablet 5  . clonazePAM (KLONOPIN) 1 MG tablet TAKE 1 TABLET BY MOUTH EVERY DAY AS NEEDED FOR PANIC OR ANXIETY 20 tablet 0  . DULoxetine (CYMBALTA) 60 MG capsule Take 1 capsule (60 mg total) by mouth daily. 30 capsule 5  . ELDERBERRY PO Take 410 mg by mouth. Daily- zinc vitamin C combo    . lansoprazole (PREVACID) 30 MG capsule Take 1 capsule (30 mg total) by mouth 2 (two) times daily before a meal. Patient prefers this one and didn't want to take Protonix 60 capsule 2  .  Multiple Vitamins-Calcium (ONE-A-DAY WOMENS FORMULA PO) Take by mouth daily.     No current facility-administered medications for this visit.    Review of Systems: GENERAL: negative for malaise, night sweats HEENT: No changes in hearing or vision, no nose bleeds or other nasal problems. NECK: Negative for lumps, goiter, pain and significant neck swelling RESPIRATORY: Negative for cough, wheezing CARDIOVASCULAR: Negative for chest pain, leg swelling, palpitations, orthopnea GI: SEE HPI MUSCULOSKELETAL: Negative for joint pain or swelling, back pain, and muscle pain. SKIN: Negative for lesions, rash PSYCH: Negative for sleep disturbance, mood disorder and recent psychosocial stressors. HEMATOLOGY Negative for prolonged bleeding, bruising easily, and swollen nodes. ENDOCRINE: Negative for cold or heat intolerance, polyuria, polydipsia and goiter. NEURO: negative for tremor, gait imbalance, syncope and seizures. The remainder of the review of systems is noncontributory.   Physical Exam: BP 122/78 (BP  Location: Right Arm, Patient Position: Sitting, Cuff Size: Normal)   Pulse 96   Temp 98.5 F (36.9 C) (Oral)   Ht 5' (1.524 m)   Wt 145 lb 14.4 oz (66.2 kg)   LMP 04/20/2018   BMI 28.49 kg/m  GENERAL: The patient is AO x3, in no acute distress. HEENT: Head is normocephalic and atraumatic. EOMI are intact. Mouth is well hydrated and without lesions. NECK: Supple. No masses LUNGS: Clear to auscultation. No presence of rhonchi/wheezing/rales. Adequate chest expansion HEART: RRR, normal s1 and s2. ABDOMEN: mildly tender to palpation in the LLQ, no guarding, no peritoneal signs, and nondistended. BS +. No masses. EXTREMITIES: Without any cyanosis, clubbing, rash, lesions or edema. NEUROLOGIC: AOx3, no focal motor deficit. SKIN: no jaundice, no rashes   Imaging/Labs: as above  I personally reviewed and interpreted the available labs, imaging and endoscopic files.  Impression and  Plan: [name] [name] is a 49 y.o. female with PMH anxiety, HTN, and HLD, who presents for evaluation of constipation.  The patient has not presented any red flag signs and has been able to manage her constipation with senna.  I think her constipation is related to possible effect of calcium channel blockers.  At this point, she should continue on her current dosage of senna and I counseled her to increase the intake of prunes.  If she has any worsening constipation, MiraLAX can be added and will need to check her etiologies such as hypothyroidism.  We will proceed with a colonoscopy for screening purposes as she is above age 36.  She is at average risk for colorectal cancer.  - Continue Senna 2 tablets daily - Take prunes frequently - Schedule colonoscopy  All questions were answered.      Maylon Peppers, MD Gastroenterology and Hepatology [facility]

## 2020-06-16 NOTE — Patient Instructions (Signed)
Continue Senna tablets Take prunes frequently Schedule colonoscopy

## 2020-06-17 ENCOUNTER — Encounter: Payer: Commercial Managed Care - PPO | Admitting: Physical Medicine and Rehabilitation

## 2020-06-20 ENCOUNTER — Other Ambulatory Visit: Payer: Self-pay | Admitting: Family Medicine

## 2020-07-03 ENCOUNTER — Other Ambulatory Visit: Payer: Self-pay | Admitting: Family Medicine

## 2020-07-04 ENCOUNTER — Other Ambulatory Visit: Payer: Self-pay

## 2020-07-29 ENCOUNTER — Other Ambulatory Visit: Payer: Self-pay | Admitting: Family Medicine

## 2020-07-29 MED ORDER — CLONAZEPAM 1 MG PO TABS: ORAL_TABLET | ORAL | 0 refills | Status: DC

## 2020-08-02 ENCOUNTER — Other Ambulatory Visit (HOSPITAL_COMMUNITY): Payer: Commercial Managed Care - PPO

## 2020-08-02 ENCOUNTER — Other Ambulatory Visit (INDEPENDENT_AMBULATORY_CARE_PROVIDER_SITE_OTHER): Payer: Self-pay | Admitting: *Deleted

## 2020-08-11 ENCOUNTER — Encounter: Payer: Self-pay | Admitting: Family Medicine

## 2020-08-11 ENCOUNTER — Encounter (INDEPENDENT_AMBULATORY_CARE_PROVIDER_SITE_OTHER): Payer: Self-pay

## 2020-08-30 ENCOUNTER — Other Ambulatory Visit (HOSPITAL_COMMUNITY)
Admission: RE | Admit: 2020-08-30 | Discharge: 2020-08-30 | Disposition: A | Discharge status: Home / Self Care | Payer: Commercial Managed Care - PPO | Source: Ambulatory Visit | Attending: Gastroenterology | Admitting: Gastroenterology

## 2020-08-30 ENCOUNTER — Other Ambulatory Visit: Payer: Self-pay

## 2020-08-30 ENCOUNTER — Telehealth (INDEPENDENT_AMBULATORY_CARE_PROVIDER_SITE_OTHER): Payer: Self-pay

## 2020-08-30 ENCOUNTER — Encounter (INDEPENDENT_AMBULATORY_CARE_PROVIDER_SITE_OTHER): Payer: Self-pay

## 2020-08-30 DIAGNOSIS — Z20822 Contact with and (suspected) exposure to covid-19: Secondary | ICD-10-CM | POA: Diagnosis not present

## 2020-08-30 DIAGNOSIS — Z01812 Encounter for preprocedural laboratory examination: Secondary | ICD-10-CM | POA: Diagnosis present

## 2020-08-30 LAB — SARS CORONAVIRUS 2 (TAT 6-24 HRS): SARS Coronavirus 2: NEGATIVE

## 2020-08-30 NOTE — Telephone Encounter (Signed)
LeighAnn Jobeth Pangilinan, CMA  

## 2020-08-31 ENCOUNTER — Other Ambulatory Visit: Payer: Self-pay

## 2020-08-31 ENCOUNTER — Encounter (HOSPITAL_COMMUNITY)
Admission: RE | Disposition: A | Discharge status: Home / Self Care | Payer: Self-pay | Source: Ambulatory Visit | Attending: Gastroenterology

## 2020-08-31 ENCOUNTER — Ambulatory Visit (HOSPITAL_COMMUNITY): Payer: Commercial Managed Care - PPO

## 2020-08-31 ENCOUNTER — Ambulatory Visit (HOSPITAL_COMMUNITY)
Admission: RE | Admit: 2020-08-31 | Discharge: 2020-08-31 | Disposition: A | Discharge status: Home / Self Care | Payer: Commercial Managed Care - PPO | Source: Ambulatory Visit | Attending: Gastroenterology | Admitting: Gastroenterology

## 2020-08-31 ENCOUNTER — Encounter (HOSPITAL_COMMUNITY): Payer: Self-pay | Admitting: Gastroenterology

## 2020-08-31 DIAGNOSIS — Z90722 Acquired absence of ovaries, bilateral: Secondary | ICD-10-CM | POA: Diagnosis not present

## 2020-08-31 DIAGNOSIS — Z1211 Encounter for screening for malignant neoplasm of colon: Secondary | ICD-10-CM | POA: Insufficient documentation

## 2020-08-31 DIAGNOSIS — K648 Other hemorrhoids: Secondary | ICD-10-CM | POA: Diagnosis not present

## 2020-08-31 DIAGNOSIS — Z87442 Personal history of urinary calculi: Secondary | ICD-10-CM | POA: Insufficient documentation

## 2020-08-31 DIAGNOSIS — Z90710 Acquired absence of both cervix and uterus: Secondary | ICD-10-CM | POA: Diagnosis not present

## 2020-08-31 DIAGNOSIS — K6389 Other specified diseases of intestine: Secondary | ICD-10-CM | POA: Diagnosis not present

## 2020-08-31 DIAGNOSIS — Z87891 Personal history of nicotine dependence: Secondary | ICD-10-CM | POA: Diagnosis not present

## 2020-08-31 DIAGNOSIS — K5730 Diverticulosis of large intestine without perforation or abscess without bleeding: Secondary | ICD-10-CM

## 2020-08-31 DIAGNOSIS — Z79899 Other long term (current) drug therapy: Secondary | ICD-10-CM | POA: Diagnosis not present

## 2020-08-31 HISTORY — PX: COLONOSCOPY WITH PROPOFOL: SHX5780

## 2020-08-31 LAB — HM COLONOSCOPY

## 2020-08-31 SURGERY — COLONOSCOPY WITH PROPOFOL
Anesthesia: General

## 2020-08-31 MED ORDER — PROPOFOL 10 MG/ML IV BOLUS
INTRAVENOUS | Status: DC | PRN
  Administered 2020-08-31: 70 mg via INTRAVENOUS

## 2020-08-31 MED ORDER — PROPOFOL 500 MG/50ML IV EMUL
INTRAVENOUS | Status: DC | PRN
  Administered 2020-08-31: 150 ug/kg/min via INTRAVENOUS

## 2020-08-31 MED ORDER — LACTATED RINGERS IV SOLN
INTRAVENOUS | Status: DC
  Administered 2020-08-31: 1000 mL via INTRAVENOUS

## 2020-08-31 NOTE — Discharge Instructions (Signed)
Hemorrhoids Hemorrhoids are swollen veins that may develop:  In the butt (rectum). These are called internal hemorrhoids.  Around the opening of the butt (anus). These are called external hemorrhoids. Hemorrhoids can cause pain, itching, or bleeding. Most of the time, they do not cause serious problems. They usually get better with diet changes, lifestyle changes, and other home treatments. What are the causes? This condition may be caused by:  Having trouble pooping (constipation).  Pushing hard (straining) to poop.  Watery poop (diarrhea).  Pregnancy.  Being very overweight (obese).  Sitting for long periods of time.  Heavy lifting or other activity that causes you to strain.  Anal sex.  Riding a bike for a long period of time. What are the signs or symptoms? Symptoms of this condition include:  Pain.  Itching or soreness in the butt.  Bleeding from the butt.  Leaking poop.  Swelling in the area.  One or more lumps around the opening of your butt. How is this diagnosed? A doctor can often diagnose this condition by looking at the affected area. The doctor may also:  Do an exam that involves feeling the area with a gloved hand (digital rectal exam).  Examine the area inside your butt using a small tube (anoscope).  Order blood tests. This may be done if you have lost a lot of blood.  Have you get a test that involves looking inside the colon using a flexible tube with a camera on the end (sigmoidoscopy or colonoscopy). How is this treated? This condition can usually be treated at home. Your doctor may tell you to change what you eat, make lifestyle changes, or try home treatments. If these do not help, procedures can be done to remove the hemorrhoids or make them smaller. These may involve:  Placing rubber bands at the base of the hemorrhoids to cut off their blood supply.  Injecting medicine into the hemorrhoids to shrink them.  Shining a type of light  energy onto the hemorrhoids to cause them to fall off.  Doing surgery to remove the hemorrhoids or cut off their blood supply. Follow these instructions at home: Eating and drinking   Eat foods that have a lot of fiber in them. These include whole grains, beans, nuts, fruits, and vegetables.  Ask your doctor about taking products that have added fiber (fibersupplements).  Reduce the amount of fat in your diet. You can do this by: ? Eating low-fat dairy products. ? Eating less red meat. ? Avoiding processed foods.  Drink enough fluid to keep your pee (urine) pale yellow. Managing pain and swelling   Take a warm-water bath (sitz bath) for 20 minutes to ease pain. Do this 3-4 times a day. You may do this in a bathtub or using a portable sitz bath that fits over the toilet.  If told, put ice on the painful area. It may be helpful to use ice between your warm baths. ? Put ice in a plastic bag. ? Place a towel between your skin and the bag. ? Leave the ice on for 20 minutes, 2-3 times a day. General instructions  Take over-the-counter and prescription medicines only as told by your doctor. ? Medicated creams and medicines may be used as told.  Exercise often. Ask your doctor how much and what kind of exercise is best for you.  Go to the bathroom when you have the urge to poop. Do not wait.  Avoid pushing too hard when you poop.  Keep your  butt dry and clean. Use wet toilet paper or moist towelettes after pooping.  Do not sit on the toilet for a long time.  Keep all follow-up visits as told by your doctor. This is important. Contact a doctor if you:  Have pain and swelling that do not get better with treatment or medicine.  Have trouble pooping.  Cannot poop.  Have pain or swelling outside the area of the hemorrhoids. Get help right away if you have:  Bleeding that will not stop. Summary  Hemorrhoids are swollen veins in the butt or around the opening of the  butt.  They can cause pain, itching, or bleeding.  Eat foods that have a lot of fiber in them. These include whole grains, beans, nuts, fruits, and vegetables.  Take a warm-water bath (sitz bath) for 20 minutes to ease pain. Do this 3-4 times a day. This information is not intended to replace advice given to you by your health care provider. Make sure you discuss any questions you have with your health care provider. Document Revised: 08/21/2018 Document Reviewed: 01/02/2018 Elsevier Patient Education  [place]. Diverticulosis  Diverticulosis is a condition that develops when small pouches (diverticula) form in the wall of the large intestine (colon). The colon is where water is absorbed and stool (feces) is formed. The pouches form when the inside layer of the colon pushes through weak spots in the outer layers of the colon. You may have a few pouches or many of them. The pouches usually do not cause problems unless they become inflamed or infected. When this happens, the condition is called diverticulitis. What are the causes? The cause of this condition is not known. What increases the risk? The following factors may make you more likely to develop this condition:  Being older than age 33. Your risk for this condition increases with age. Diverticulosis is rare among people younger than age 56. By age 57, many people have it.  Eating a low-fiber diet.  Having frequent constipation.  Being overweight.  Not getting enough exercise.  Smoking.  Taking over-the-counter pain medicines, like aspirin and ibuprofen.  Having a family history of diverticulosis. What are the signs or symptoms? In most people, there are no symptoms of this condition. If you do have symptoms, they may include:  Bloating.  Cramps in the abdomen.  Constipation or diarrhea.  Pain in the lower left side of the abdomen. How is this diagnosed? Because diverticulosis usually has no symptoms, it is  most often diagnosed during an exam for other colon problems. The condition may be diagnosed by:  Using a flexible scope to examine the colon (colonoscopy).  Taking an X-ray of the colon after dye has been put into the colon (barium enema).  Having a CT scan. How is this treated? You may not need treatment for this condition. Your health care provider may recommend treatment to prevent problems. You may need treatment if you have symptoms or if you previously had diverticulitis. Treatment may include:  Eating a high-fiber diet.  Taking a fiber supplement.  Taking a live bacteria supplement (probiotic).  Taking medicine to relax your colon. Follow these instructions at home: Medicines  Take over-the-counter and prescription medicines only as told by your health care provider.  If told by your health care provider, take a fiber supplement or probiotic. Constipation prevention Your condition may cause constipation. To prevent or treat constipation, you may need to:  Drink enough fluid to keep your urine pale yellow.  Take over-the-counter or prescription medicines.  Eat foods that are high in fiber, such as beans, whole grains, and fresh fruits and vegetables.  Limit foods that are high in fat and processed sugars, such as fried or sweet foods.  General instructions  Try not to strain when you have a bowel movement.  Keep all follow-up visits as told by your health care provider. This is important. Contact a health care provider if you:  Have pain in your abdomen.  Have bloating.  Have cramps.  Have not had a bowel movement in 3 days. Get help right away if:  Your pain gets worse.  Your bloating becomes very bad.  You have a fever or chills, and your symptoms suddenly get worse.  You vomit.  You have bowel movements that are bloody or black.  You have bleeding from your rectum. Summary  Diverticulosis is a condition that develops when small pouches  (diverticula) form in the wall of the large intestine (colon).  You may have a few pouches or many of them.  This condition is most often diagnosed during an exam for other colon problems.  Treatment may include increasing the fiber in your diet, taking supplements, or taking medicines. This information is not intended to replace advice given to you by your health care provider. Make sure you discuss any questions you have with your health care provider. Document Revised: 03/12/2019 Document Reviewed: 03/12/2019 Elsevier Patient Education  2020 Elsevier Inc.   High-Fiber Diet Fiber, also called dietary fiber, is a type of carbohydrate that is found in fruits, vegetables, whole grains, and beans. A high-fiber diet can have many health benefits. Your health care provider may recommend a high-fiber diet to help:  Prevent constipation. Fiber can make your bowel movements more regular.  Lower your cholesterol.  Relieve the following conditions: ? Swelling of veins in the anus (hemorrhoids). ? Swelling and irritation (inflammation) of specific areas of the digestive tract (uncomplicated diverticulosis). ? A problem of the large intestine (colon) that sometimes causes pain and diarrhea (irritable bowel syndrome, IBS).  Prevent overeating as part of a weight-loss plan.  Prevent heart disease, type 2 diabetes, and certain cancers. What is my plan? The recommended daily fiber intake in grams (g) includes:  38 g for men age 40 or younger.  30 g for men over age 92.  25 g for women age 38 or younger.  21 g for women over age 38. You can get the recommended daily intake of dietary fiber by:  Eating a variety of fruits, vegetables, grains, and beans.  Taking a fiber supplement, if it is not possible to get enough fiber through your diet. What do I need to know about a high-fiber diet?  It is better to get fiber through food sources rather than from fiber supplements. There is not a lot  of research about how effective supplements are.  Always check the fiber content on the nutrition facts label of any prepackaged food. Look for foods that contain 5 g of fiber or more per serving.  Talk with a diet and nutrition specialist (dietitian) if you have questions about specific foods that are recommended or not recommended for your medical condition, especially if those foods are not listed below.  Gradually increase how much fiber you consume. If you increase your intake of dietary fiber too quickly, you may have bloating, cramping, or gas.  Drink plenty of water. Water helps you to digest fiber. What are tips for following this  plan?  Eat a wide variety of high-fiber foods.  Make sure that half of the grains that you eat each day are whole grains.  Eat breads and cereals that are made with whole-grain flour instead of refined flour or white flour.  Eat brown rice, bulgur wheat, or millet instead of white rice.  Start the day with a breakfast that is high in fiber, such as a cereal that contains 5 g of fiber or more per serving.  Use beans in place of meat in soups, salads, and pasta dishes.  Eat high-fiber snacks, such as berries, raw vegetables, nuts, and popcorn.  Choose whole fruits and vegetables instead of processed forms like juice or sauce. What foods can I eat?  Fruits Berries. Pears. Apples. Oranges. Avocado. Prunes and raisins. Dried figs. Vegetables Sweet potatoes. Spinach. Kale. Artichokes. Cabbage. Broccoli. Cauliflower. Green peas. Carrots. Squash. Grains Whole-grain breads. Multigrain cereal. Oats and oatmeal. Brown rice. Barley. Bulgur wheat. [name]. Quinoa. Bran muffins. Popcorn. Rye wafer crackers. Meats and other proteins Navy, kidney, and pinto beans. Soybeans. Split peas. Lentils. Nuts and seeds. Dairy Fiber-fortified yogurt. Beverages Fiber-fortified soy milk. Fiber-fortified orange juice. Other foods Fiber bars. The items listed above may  not be a complete list of recommended foods and beverages. Contact a dietitian for more options. What foods are not recommended? Fruits Fruit juice. Cooked, strained fruit. Vegetables Fried potatoes. Canned vegetables. Well-cooked vegetables. Grains White bread. Pasta made with refined flour. White rice. Meats and other proteins Fatty cuts of meat. Fried chicken or fried fish. Dairy Milk. Yogurt. Cream cheese. Sour cream. Fats and oils Butters. Beverages Soft drinks. Other foods Cakes and pastries. The items listed above may not be a complete list of foods and beverages to avoid. Contact a dietitian for more information. Summary  Fiber is a type of carbohydrate. It is found in fruits, vegetables, whole grains, and beans.  There are many health benefits of eating a high-fiber diet, such as preventing constipation, lowering blood cholesterol, helping with weight loss, and reducing your risk of heart disease, diabetes, and certain cancers.  Gradually increase your intake of fiber. Increasing too fast can result in cramping, bloating, and gas. Drink plenty of water while you increase your fiber.  The best sources of fiber include whole fruits and vegetables, whole grains, nuts, seeds, and beans. This information is not intended to replace advice given to you by your health care provider. Make sure you discuss any questions you have with your health care provider. Document Revised: 06/17/2017 Document Reviewed: 06/17/2017 Elsevier Patient Education  2020 [place] are being discharged to home.  Eat a high fiber diet.  Your physician has recommended a repeat colonoscopy in 10 years for screening purposes.

## 2020-08-31 NOTE — Anesthesia Preprocedure Evaluation (Signed)
Anesthesia Evaluation  Patient identified by MRN, date of birth, ID band Patient awake    Reviewed: Allergy & Precautions, H&P , NPO status , Patient's Chart, lab work & pertinent test results, reviewed documented beta blocker date and time   Airway Mallampati: II  TM Distance: >3 FB Neck ROM: full    Dental no notable dental hx. (+) Teeth Intact   Pulmonary neg pulmonary ROS, former smoker,    Pulmonary exam normal breath sounds clear to auscultation       Cardiovascular Exercise Tolerance: Good hypertension, negative cardio ROS   Rhythm:regular Rate:Normal     Neuro/Psych PSYCHIATRIC DISORDERS Anxiety Depression  Neuromuscular disease    GI/Hepatic Neg liver ROS, GERD  Medicated,  Endo/Other  negative endocrine ROS  Renal/GU Renal disease  negative genitourinary   Musculoskeletal   Abdominal   Peds  Hematology negative hematology ROS (+)   Anesthesia Other Findings   Reproductive/Obstetrics negative OB ROS                             Anesthesia Physical Anesthesia Plan  ASA: II  Anesthesia Plan: General   Post-op Pain Management:    Induction:   PONV Risk Score and Plan: Propofol infusion  Airway Management Planned:   Additional Equipment:   Intra-op Plan:   Post-operative Plan:   Informed Consent: I have reviewed the patients History and Physical, chart, labs and discussed the procedure including the risks, benefits and alternatives for the proposed anesthesia with the patient or authorized representative who has indicated his/her understanding and acceptance.     Dental Advisory Given  Plan Discussed with: CRNA  Anesthesia Plan Comments:         Anesthesia Quick Evaluation

## 2020-08-31 NOTE — Transfer of Care (Signed)
Immediate Anesthesia Transfer of Care Note  Patient: [name] [name]  Procedure(s) Performed: COLONOSCOPY WITH PROPOFOL (N/A )  Patient Location: PACU  Anesthesia Type:General  Level of Consciousness: drowsy  Airway & Oxygen Therapy: Patient Spontanous Breathing  Post-op Assessment: Report given to RN and Post -op Vital signs reviewed and stable  Post vital signs: Reviewed and stable  Last Vitals:  Vitals Value Taken Time  BP 129/87   Temp    Pulse 81   Resp 14   SpO2 99     Last Pain:  Vitals:   08/31/20 0737  TempSrc:   PainSc: 0-No pain      Patients Stated Pain Goal: 7 (08/31/20 0645)  Complications: No complications documented.

## 2020-08-31 NOTE — H&P (Signed)
[name] [name] is an 50 y.o. female.   Chief Complaint: screening colonoscopy HPI: [name] [name] is a 50 y.o. female with PMH anxiety, HTN, and HLD, coming for screening colonoscopy. The patient has never had a colonoscopy in the past.  The patient denies having any complaints such as melena, hematochezia, abdominal pain or distention, change in her bowel movement consistency or frequency, no changes in her weight recently.  No family history of colorectal cancer.   Past Medical History:  Diagnosis Date  . Abnormal vaginal Pap smear    Dr Emelda Fear  . Allergic rhinitis   . Anxiety    [employer] 05/25/2020  . BV (bacterial vaginosis) 02/09/2015  . Depression    [facility] 05/25/2020  . Depression, recurrent (HCC) 07/31/2013   PHQ 9 score of 13 on 11/23/2013, score of 12 in 01/2018, not suicidal or homicidal  . Fibroids 10/28/2013  . Hyperlipidemia    x4 years   . Hypertension    whenshe wasa young but she changed diet and reducd salt intake   . Irregular intermenstrual bleeding 10/28/2013  . Kidney stones   . Mental disorder   . Unspecified symptom associated with female genital organs 10/28/2013  . Vaginal discharge 02/09/2015  . Vaginal odor 02/09/2015  . Vaginal Pap smear, abnormal     Past Surgical History:  Procedure Laterality Date  . ABDOMINAL HYSTERECTOMY N/A 06/04/2018   Procedure: TOTAL ABDOMINAL HYSTERECTOMY WITH LEFT OOPHORECTOMY;  Surgeon: Lazaro Arms, MD;  Location: [facility];  Service: Gynecology;  Laterality: N/A;  . BREAST REDUCTION SURGERY  Jan 2007  . CERVICAL BIOPSY  2010   for abnormal pap   . cyst removed from both breast , all benign both breast    . DILITATION & CURRETTAGE/HYSTROSCOPY WITH THERMACHOICE ABLATION N/A 12/15/2013   Procedure: DILATATION & CURETTAGE/HYSTEROSCOPY WITH THERMACHOICE ABLATION;  Surgeon: Tilda Burrow, MD;  Location: [facility];  Service: Gynecology;  Laterality: N/A;  total therapy time:30min 1sec D5W  31ml in, D5W  42ml out Temperature 87  degree c.  . EXCISION OF SKIN TAG Right 12/15/2013   Procedure: EXCISION OF SKIN TAG;  Surgeon: Tilda Burrow, MD;  Location: [facility];  Service: Gynecology;  Laterality: Right;  . LAPAROSCOPIC BILATERAL SALPINGECTOMY Bilateral 12/15/2013   Procedure: LAPAROSCOPIC BILATERAL SALPINGECTOMY;  Surgeon: Tilda Burrow, MD;  Location: [facility];  Service: Gynecology;  Laterality: Bilateral;  . REMOVAL OF NON VAGINAL CONTRACEPTIVE DEVICE Left 12/15/2013   Procedure: REMOVAL OF NON VAGINAL CONTRACEPTIVE DEVICE-IMPLANON;  Surgeon: Tilda Burrow, MD;  Location: [facility];  Service: Gynecology;  Laterality: Left;    Family History  Problem Relation Age of Onset  . Fibromyalgia Mother   . Hypertension Mother   . Thyroid disease Mother   . Arthritis Mother   . Alcohol abuse Father   . COPD Father   . Bipolar disorder Father   . Bipolar disorder Sister   . Anxiety disorder Sister        has panic attacks  . Bipolar disorder Brother   . Alcohol abuse Brother   . Alcohol abuse Brother   . Alcohol abuse Brother   . Other Brother        handicap  . Alcohol abuse Brother   . Diabetes Maternal Grandfather   . Other Paternal Grandfather        commited suicide   Social History:  reports that she quit smoking about 11 years ago. Her smoking use included cigarettes. She has a  4.00 pack-year smoking history. She has never used smokeless tobacco. She reports current alcohol use. She reports that she does not use drugs.  Allergies: No Known Allergies  Medications Prior to Admission  Medication Sig Dispense Refill  . amLODipine (NORVASC) 2.5 MG tablet Take 1 tablet (2.5 mg total) by mouth daily. (Patient taking differently: Take 2.5 mg by mouth daily. Take with 5 mg for a total of 7.5 mg in the morning) 30 tablet 5  . amLODipine (NORVASC) 5 MG tablet TAKE 1 TABLET(5 MG) BY MOUTH DAILY (Patient taking differently: Take 5 mg by mouth daily. Take 2.5 mg with the 5 mg for a total of 7.5 mg daily in the morning) 30  tablet 3  . aspirin-acetaminophen-caffeine (EXCEDRIN MIGRAINE) 250-250-65 MG tablet Take 1 tablet by mouth daily as needed for headache.    Marland Kitchen atorvastatin (LIPITOR) 20 MG tablet Take 1 tablet (20 mg total) by mouth daily. 30 tablet 5  . busPIRone (BUSPAR) 5 MG tablet TAKE 1 TABLET(5 MG) BY MOUTH TWICE DAILY (Patient taking differently: Take 5 mg by mouth 2 (two) times daily.) 60 tablet 5  . DULoxetine (CYMBALTA) 60 MG capsule Take 1 capsule (60 mg total) by mouth daily. 30 capsule 5  . ELDERBERRY PO Take 410 mg by mouth daily. Daily- zinc vitamin C combo    . lansoprazole (PREVACID) 30 MG capsule TAKE 1 CAPSULE(30 MG) BY MOUTH TWICE DAILY BEFORE A MEAL (Patient taking differently: Take 30 mg by mouth daily.) 60 capsule 2  . Multiple Vitamins-Calcium (ONE-A-DAY WOMENS FORMULA PO) Take 1 capsule by mouth daily.    . clonazePAM (KLONOPIN) 1 MG tablet TAKE 1 TABLET BY MOUTH EVERY DAY AS NEEDED FOR PANIC OR ANXIETY (Patient taking differently: Take 1 mg by mouth daily as needed for anxiety.) 20 tablet 0  . clonazePAM (KLONOPIN) 1 MG tablet Take one tablet  By mouth once daily , as needed, for panic or uncontrolled anxiety (Patient not taking: Reported on 08/22/2020) 20 tablet 0    Results for orders placed or performed during the hospital encounter of 08/30/20 (from the past 48 hour(s))  SARS CORONAVIRUS 2 (TAT 6-24 HRS) Nasopharyngeal Nasopharyngeal Swab     Status: None   Collection Time: 08/30/20  8:09 AM   Specimen: Nasopharyngeal Swab  Result Value Ref Range   SARS Coronavirus 2 NEGATIVE NEGATIVE    Comment: (NOTE) SARS-CoV-2 target nucleic acids are NOT DETECTED.  The SARS-CoV-2 RNA is generally detectable in upper and lower respiratory specimens during the acute phase of infection. Negative results do not preclude SARS-CoV-2 infection, do not rule out co-infections with other pathogens, and should not be used as the sole basis for treatment or other patient management decisions. Negative  results must be combined with clinical observations, patient history, and epidemiological information. The expected result is Negative.  Fact Sheet for Patients: HairSlick.no  Fact Sheet for Healthcare Providers: quierodirigir.com  This test is not yet approved or cleared by the [place] FDA and  has been authorized for detection and/or diagnosis of SARS-CoV-2 by FDA under an Emergency Use Authorization (EUA). This EUA will remain  in effect (meaning this test can be used) for the duration of the COVID-19 declaration under Se ction 564(b)(1) of the Act, 21 U.S.C. section 360bbb-3(b)(1), unless the authorization is terminated or revoked sooner.  Performed at [facility], [address] [place], [place] [address]    No results found.  Review of Systems  Constitutional: Negative.   HENT: Negative.  Eyes: Negative.   Respiratory: Negative.   Cardiovascular: Negative.   Gastrointestinal: Negative.   Endocrine: Negative.   Genitourinary: Negative.   Musculoskeletal: Negative.   Skin: Negative.   Allergic/Immunologic: Negative.   Hematological: Negative.   Psychiatric/Behavioral: Negative.     Blood pressure 122/80, pulse 87, temperature 99.1 F (37.3 C), temperature source Oral, resp. rate 15, height 5' (1.524 m), weight 58.1 kg, last menstrual period 04/20/2018, SpO2 98 %. Physical Exam  GENERAL: The patient is AO x3, in no acute distress. HEENT: Head is normocephalic and atraumatic. EOMI are intact. Mouth is well hydrated and without lesions. NECK: Supple. No masses LUNGS: Clear to auscultation. No presence of rhonchi/wheezing/rales. Adequate chest expansion HEART: RRR, normal s1 and s2. ABDOMEN: Soft, nontender, no guarding, no peritoneal signs, and nondistended. BS +. No masses. EXTREMITIES: Without any cyanosis, clubbing, rash, lesions or edema. NEUROLOGIC: AOx3, no focal motor deficit. SKIN: no  jaundice, no rashes  Assessment/Plan [name] [name] is a 50 y.o. female with PMH anxiety, HTN, and HLD, coming for screening colonoscopy. The patient is at average risk for colorectal cancer.  We will proceed with colonoscopy today.   Dolores Frame, MD 08/31/2020, 7:31 AM

## 2020-08-31 NOTE — Op Note (Signed)
[facility] Patient Name: [name] [name] Procedure Date: 08/31/2020 7:21 AM MRN: [id number] Date of Birth: [birth year] Attending MD: Maylon Peppers ,  CSN: [id number] Age: 50 Admit Type: Outpatient Procedure:                Colonoscopy Indications:              Screening for colorectal malignant neoplasm Providers:                Maylon Peppers, [place] Sharon Seller, RN, Casimer Bilis, Technician Referring MD:              Medicines:                Monitored Anesthesia Care Complications:            No immediate complications. Estimated Blood Loss:     Estimated blood loss: none. Procedure:                Pre-Anesthesia Assessment:                           - Prior to the procedure, a History and Physical                            was performed, and patient medications, allergies                            and sensitivities were reviewed. The patient's                            tolerance of previous anesthesia was reviewed.                           - The risks and benefits of the procedure and the                            sedation options and risks were discussed with the                            patient. All questions were answered and informed                            consent was obtained.                           - ASA Grade Assessment: II - A patient with mild                            systemic disease.                           After obtaining informed consent, the colonoscope                            was passed under direct vision. Throughout the  procedure, the patient's blood pressure, pulse, and                            oxygen saturations were monitored continuously. The                            [id number] scope was introduced through                            the anus and advanced to the the cecum, identified                            by appendiceal orifice and ileocecal valve.  The                            colonoscopy was performed without difficulty. The                            patient tolerated the procedure well. The quality                            of the bowel preparation was good. Scope withdrawal                            time was 11 minutes. Scope In: 7:40:39 AM Scope Out: [id number] AM Scope Withdrawal Time: 0 hours 11 minutes 46 seconds  Total Procedure Duration: 0 hours 15 minutes 57 seconds  Findings:      The perianal and digital rectal examinations were normal.      A diffuse area of mild melanosis was found in the entire colon.      A few small-mouthed diverticula were found in the descending colon.      Non-bleeding internal hemorrhoids were found during retroflexion. The       hemorrhoids were small. Impression:               - Melanosis in the colon.                           - Diverticulosis in the descending colon.                           - Non-bleeding internal hemorrhoids.                           - No specimens collected. Moderate Sedation:      Per Anesthesia Care Recommendation:           - Discharge patient to home (ambulatory).                           - High fiber diet.                           - Repeat colonoscopy in 10 years for screening  purposes. Procedure Code(s):        --- Professional ---                           T2458, GC, Colorectal cancer screening; colonoscopy                            on individual not meeting criteria for high risk Diagnosis Code(s):        --- Professional ---                           Z12.11, Encounter for screening for malignant                            neoplasm of colon                           K63.89, Other specified diseases of intestine                           K64.8, Other hemorrhoids                           K57.30, Diverticulosis of large intestine without                            perforation or abscess without bleeding CPT copyright 2019  American Medical Association. All rights reserved. The codes documented in this report are preliminary and upon coder review may  be revised to meet current compliance requirements. Katrinka Blazing, MD Katrinka Blazing,  08/31/2020 8:01:16 AM This report has been signed electronically. Number of Addenda: 0

## 2020-08-31 NOTE — Anesthesia Postprocedure Evaluation (Signed)
Anesthesia Post Note  Patient: [name] [name]  Procedure(s) Performed: COLONOSCOPY WITH PROPOFOL (N/A )  Patient location during evaluation: Phase II Anesthesia Type: General Level of consciousness: oriented, awake and patient cooperative Pain management: pain level controlled Vital Signs Assessment: post-procedure vital signs reviewed and stable Respiratory status: spontaneous breathing and respiratory function stable Cardiovascular status: blood pressure returned to baseline and stable Postop Assessment: no apparent nausea or vomiting Anesthetic complications: no   No complications documented.   Last Vitals:  Vitals:   08/31/20 0645  BP: 122/80  Pulse: 87  Resp: 15  Temp: 37.3 C  SpO2: 98%    Last Pain:  Vitals:   08/31/20 0737  TempSrc:   PainSc: 0-No pain                 Lorin Glass

## 2020-09-05 ENCOUNTER — Encounter (INDEPENDENT_AMBULATORY_CARE_PROVIDER_SITE_OTHER): Payer: Self-pay | Admitting: *Deleted

## 2020-09-05 ENCOUNTER — Encounter (HOSPITAL_COMMUNITY): Payer: Self-pay | Admitting: Gastroenterology

## 2020-09-08 ENCOUNTER — Other Ambulatory Visit: Payer: Self-pay | Admitting: Family Medicine

## 2020-09-08 DIAGNOSIS — F411 Generalized anxiety disorder: Secondary | ICD-10-CM

## 2020-10-02 ENCOUNTER — Other Ambulatory Visit: Payer: Self-pay | Admitting: Family Medicine

## 2020-10-08 ENCOUNTER — Other Ambulatory Visit: Payer: Self-pay | Admitting: Family Medicine

## 2020-10-11 ENCOUNTER — Telehealth: Payer: Self-pay

## 2020-10-11 NOTE — Telephone Encounter (Signed)
PT is calling to get a refill on Clonazepam refilled

## 2020-10-11 NOTE — Telephone Encounter (Signed)
Please see request

## 2020-10-12 ENCOUNTER — Other Ambulatory Visit: Payer: Self-pay | Admitting: Family Medicine

## 2020-10-12 MED ORDER — CLONAZEPAM 1 MG PO TABS: ORAL_TABLET | ORAL | 0 refills | Status: DC

## 2020-10-12 NOTE — Telephone Encounter (Signed)
Med sent.

## 2020-10-13 ENCOUNTER — Telehealth: Payer: Self-pay

## 2020-10-13 NOTE — Telephone Encounter (Signed)
Pt aware.

## 2020-10-13 NOTE — Telephone Encounter (Signed)
Said her original appt was 4/21 and it was changed to 4/12 via mychart. She said its bp and she needs to come in to see available provider and she wants it on 4/21 because she is off that day

## 2020-10-24 NOTE — Telephone Encounter (Signed)
Patient will call back to schedule she is having hand surgery

## 2020-11-07 ENCOUNTER — Other Ambulatory Visit: Payer: Self-pay | Admitting: Family Medicine

## 2020-11-23 ENCOUNTER — Ambulatory Visit: Payer: Commercial Managed Care - PPO | Admitting: Family Medicine

## 2020-12-04 ENCOUNTER — Other Ambulatory Visit: Payer: Self-pay | Admitting: Family Medicine

## 2020-12-06 ENCOUNTER — Telehealth: Payer: Commercial Managed Care - PPO | Admitting: Family Medicine

## 2020-12-07 ENCOUNTER — Encounter: Payer: Self-pay | Admitting: Family Medicine

## 2020-12-07 ENCOUNTER — Telehealth: Payer: Commercial Managed Care - PPO | Admitting: Family Medicine

## 2020-12-07 VITALS — BP 142/81

## 2020-12-07 DIAGNOSIS — E663 Overweight: Secondary | ICD-10-CM

## 2020-12-07 DIAGNOSIS — E785 Hyperlipidemia, unspecified: Secondary | ICD-10-CM | POA: Diagnosis not present

## 2020-12-07 DIAGNOSIS — F411 Generalized anxiety disorder: Secondary | ICD-10-CM | POA: Diagnosis not present

## 2020-12-07 DIAGNOSIS — I10 Essential (primary) hypertension: Secondary | ICD-10-CM

## 2020-12-07 MED ORDER — CLONAZEPAM 1 MG PO TABS: ORAL_TABLET | ORAL | 3 refills | Status: DC

## 2020-12-07 NOTE — Patient Instructions (Signed)
Annual physical exam in office with MD first week in October, call if you need me sooner  Please get fasting lipid, TSH, cmp and eGFr , vit d levels end August ( [place])   Script for [employer], 20 tabs to last 8 weeks , next due 12/10/2020  It is important that you exercise regularly at least 30 minutes 5 times a week. If you develop chest pain, have severe difficulty breathing, or feel very tired, stop exercising immediately and seek medical attention  Think about what you will eat, plan ahead. Choose " clean, green, fresh or frozen" over canned, processed or packaged foods which are more sugary, salty and fatty. 70 to 75% of food eaten should be vegetables and fruit. Three meals at set times with snacks allowed between meals, but they must be fruit or vegetables. Aim to eat over a 12 hour period , example 7 am to 7 pm, and STOP after  your last meal of the day. Drink water,generally about 64 ounces per day, no other drink is as healthy. Fruit juice is best enjoyed in a healthy way, by EATING the fruit. Thanks for choosing [facility], we consider it a privelige to serve you.

## 2020-12-07 NOTE — Progress Notes (Signed)
Virtual Visit via Telephone Note  I connected with [name] [name] on 12/07/20 at  2:00 PM EDT by telephone and verified that I am speaking with the correct person using two identifiers.  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 I also discussed with the patient that there may be a patient responsible charge related to this service. The patient expressed understanding and agreed to proceed.   History of Present Illness: F/U chronic problems and address any new or current concerns. Review and update medications and allergies. Review recent lab and radiologic data . Update routine health maintainace. Review an encourage improved health habits to include nutrition, exercise and  sleep .  Denies recent fever or chills. Denies sinus pressure, nasal congestion, ear pain or sore throat. Denies chest congestion, productive cough or wheezing. Denies chest pains, palpitations and leg swelling Denies abdominal pain, nausea, vomiting,diarrhea or constipation.   Denies dysuria, frequency, hesitancy or incontinence. Denies joint pain, swelling and limitation in mobility. Denies headaches, seizures, numbness, or tingling. Denies depression, anxiety or insomnia. Denies skin break down or rash.       Observations/Objective: BP (!) 142/81   LMP 04/20/2018  Good communication with no confusion and intact memory. Alert and oriented x 3 No signs of respiratory distress during speech    Assessment and Plan: Benign essential HTN DASH diet and commitment to daily physical activity for a minimum of 30 minutes discussed and encouraged, as a part of hypertension management. The importance of attaining a healthy weight is also discussed.  BP/Weight 12/07/2020 08/31/2020 06/16/2020 05/26/2020 04/07/2020 03/14/2020 44/81/8563   Systolic BP 149 702 637 858 850 277 412  Diastolic BP 81 87 78 84 92 92 101  Wt. (Lbs) - 128 145.9 145 148 148.12 134  BMI - 25 28.49 28.32 28.9 28.93 26.17  Some encounter information is confidential and restricted. Go to Review Flowsheets activity to see all data.   Controlled, no change in medication     Hyperlipidemia LDL goal <100 Hyperlipidemia:Low fat diet discussed and encouraged.   Lipid Panel  Lab Results  Component Value Date   CHOL 250 (H) 12/01/2014   HDL 73 12/01/2014   LDLCALC 159 (H) 12/01/2014   TRIG 89 12/01/2014   CHOLHDL 3.4 12/01/2014  Updated lab needed at/ before next visit.     Overweight (BMI 25.0-29.9)  Patient re-educated about  the importance of commitment to a  minimum of 150 minutes of exercise per week as able.  The importance of healthy food choices with portion control discussed, as well as eating regularly and within a 12 hour window most days. The need to choose "clean , green" food 50 to 75% of the time is discussed, as well as to make water the primary drink and set a goal of 64 ounces water daily.    Weight /BMI 08/31/2020 06/16/2020 05/26/2020  WEIGHT 128 lb 145 lb 14.4 oz 145 lb  HEIGHT 5\' 0"  5\' 0"  5\' 0"   BMI 25 kg/m2 28.49 kg/m2 28.32 kg/m2  Some encounter information is confidential and restricted. Go to Review Flowsheets activity to see all data.      Generalized anxiety disorder Controlled, no change in medication     Follow Up Instructions:    I discussed the assessment and treatment plan with the patient. The patient was provided an opportunity to ask questions  and all were answered. The patient agreed with the plan and demonstrated an understanding of the instructions.   The patient was advised to call back or seek an in-person evaluation if the symptoms worsen or if the condition fails to improve as anticipated.  I provided 14  minutes of non-face-to-face time during this encounter.   Tula Nakayama,  MD '

## 2020-12-09 ENCOUNTER — Encounter: Payer: Self-pay | Admitting: Family Medicine

## 2020-12-09 NOTE — Assessment & Plan Note (Signed)
Controlled, no change in medication  

## 2020-12-09 NOTE — Assessment & Plan Note (Signed)
Hyperlipidemia:Low fat diet discussed and encouraged.   Lipid Panel  Lab Results  Component Value Date   CHOL 250 (H) 12/01/2014   HDL 73 12/01/2014   LDLCALC 159 (H) 12/01/2014   TRIG 89 12/01/2014   CHOLHDL 3.4 12/01/2014  Updated lab needed at/ before next visit.

## 2020-12-09 NOTE — Assessment & Plan Note (Signed)
  Patient re-educated about  the importance of commitment to a  minimum of 150 minutes of exercise per week as able.  The importance of healthy food choices with portion control discussed, as well as eating regularly and within a 12 hour window most days. The need to choose "clean , green" food 50 to 75% of the time is discussed, as well as to make water the primary drink and set a goal of 64 ounces water daily.    Weight /BMI 08/31/2020 06/16/2020 05/26/2020  WEIGHT 128 lb 145 lb 14.4 oz 145 lb  HEIGHT 5\' 0"  5\' 0"  5\' 0"   BMI 25 kg/m2 28.49 kg/m2 28.32 kg/m2  Some encounter information is confidential and restricted. Go to Review Flowsheets activity to see all data.

## 2020-12-09 NOTE — Assessment & Plan Note (Signed)
DASH diet and commitment to daily physical activity for a minimum of 30 minutes discussed and encouraged, as a part of hypertension management. The importance of attaining a healthy weight is also discussed.  BP/Weight 12/07/2020 08/31/2020 06/16/2020 05/26/2020 04/07/2020 03/14/2020 34/10/5246  Systolic BP 185 909 311 216 244 695 072  Diastolic BP 81 87 78 84 92 92 101  Wt. (Lbs) - 128 145.9 145 148 148.12 134  BMI - 25 28.49 28.32 28.9 28.93 26.17  Some encounter information is confidential and restricted. Go to Review Flowsheets activity to see all data.   Controlled, no change in medication

## 2020-12-14 ENCOUNTER — Other Ambulatory Visit: Payer: Self-pay | Admitting: Family Medicine

## 2020-12-15 ENCOUNTER — Ambulatory Visit: Payer: Commercial Managed Care - PPO | Admitting: Family Medicine

## 2020-12-27 HISTORY — PX: BILATERAL CARPAL TUNNEL RELEASE: SHX6508

## 2021-01-03 ENCOUNTER — Other Ambulatory Visit: Payer: Self-pay | Admitting: Family Medicine

## 2021-03-10 ENCOUNTER — Other Ambulatory Visit: Payer: Self-pay | Admitting: Family Medicine

## 2021-03-10 DIAGNOSIS — F411 Generalized anxiety disorder: Secondary | ICD-10-CM

## 2021-04-10 ENCOUNTER — Other Ambulatory Visit: Payer: Self-pay | Admitting: Family Medicine

## 2021-05-31 ENCOUNTER — Encounter: Payer: Self-pay | Admitting: Family Medicine

## 2021-05-31 ENCOUNTER — Ambulatory Visit (INDEPENDENT_AMBULATORY_CARE_PROVIDER_SITE_OTHER): Payer: Commercial Managed Care - PPO | Admitting: Family Medicine

## 2021-05-31 ENCOUNTER — Other Ambulatory Visit: Payer: Self-pay

## 2021-05-31 VITALS — BP 114/78 | HR 88 | Resp 16 | Ht 60.0 in | Wt 141.0 lb

## 2021-05-31 DIAGNOSIS — R7302 Impaired glucose tolerance (oral): Secondary | ICD-10-CM

## 2021-05-31 DIAGNOSIS — I10 Essential (primary) hypertension: Secondary | ICD-10-CM

## 2021-05-31 DIAGNOSIS — E785 Hyperlipidemia, unspecified: Secondary | ICD-10-CM | POA: Diagnosis not present

## 2021-05-31 DIAGNOSIS — F411 Generalized anxiety disorder: Secondary | ICD-10-CM

## 2021-05-31 DIAGNOSIS — Z0000 Encounter for general adult medical examination without abnormal findings: Secondary | ICD-10-CM | POA: Diagnosis not present

## 2021-05-31 DIAGNOSIS — E559 Vitamin D deficiency, unspecified: Secondary | ICD-10-CM

## 2021-05-31 DIAGNOSIS — R5383 Other fatigue: Secondary | ICD-10-CM

## 2021-05-31 MED ORDER — BUSPIRONE HCL 7.5 MG PO TABS
7.5000 mg | ORAL_TABLET | Freq: Three times a day (TID) | ORAL | 5 refills | Status: DC
Start: 2021-05-31 — End: 2021-12-07

## 2021-05-31 MED ORDER — CLONAZEPAM 1 MG PO TABS: 1.0000 mg | ORAL_TABLET | Freq: Every day | ORAL | 3 refills | Status: DC

## 2021-05-31 NOTE — Patient Instructions (Addendum)
F/U in 4 months, call if you need me sooner, shingrix #2 at visit  Fasting lipid, cmp and EGr, hBA1C, CBC , TSH and vit D as soon as possible ( [place])  Increase dose buspar, take every 8 hours on schedule, new is  klonopin one daily   Please get Covid booster  It is important that you exercise regularly at least 30 minutes 5 times a week. If you develop chest pain, have severe difficulty breathing, or feel very tired, stop exercising immediately and seek medical attention  Thanks for choosing [facility], we consider it a privelige to serve you.  Thanks for choosing [facility], we consider it a privelige to serve you.

## 2021-05-31 NOTE — Progress Notes (Addendum)
    [name] [name]     MRN: [id number]      DOB: 1971-07-07  HPI: Patient is in for annual physical exam. C/o increased stress having to take care of her brother, anxious , overwhelmed, fatigued, little energy . Immunization is reviewed , and  updated if needed.   PE: BP 114/78   Pulse 88   Resp 16   Ht 5' (1.524 m)   Wt 141 lb (64 kg)   LMP 04/20/2018   SpO2 94%   BMI 27.54 kg/m   Pleasant  female, alert and oriented x 3, in no cardio-pulmonary distress. Afebrile. HEENT No facial trauma or asymetry. Sinuses non tender.  Extra occullar muscles intact.. External ears normal, . Neck: supple, no adenopathy,JVD or thyromegaly.No bruits.  Chest: Clear to ascultation bilaterally.No crackles or wheezes. Non tender to palpation  Breast: No asymetry,no masses or lumps. No tenderness. No nipple discharge or inversion. No axillary or supraclavicular adenopathy  Cardiovascular system; Heart sounds normal,  S1 and  S2 ,no S3.  No murmur, or thrill. Apical beat not displaced Peripheral pulses normal.  Abdomen: Soft, non tender, no organomegaly or masses. No bruits. Bowel sounds normal. No guarding, tenderness or rebound.    Musculoskeletal exam: Full ROM of spine, hips , shoulders and knees. No deformity ,swelling or crepitus noted. No muscle wasting or atrophy.   Neurologic: Cranial nerves 2 to 12 intact. Power, tone ,sensation and reflexes normal throughout. No disturbance in gait. No tremor.  Skin: Intact, no ulceration, erythema , scaling or rash noted. Pigmentation normal throughout  Psych; Normal mood and affect. Judgement and concentration normal, becomes anxious when talking about her brother   Assessment & Plan:   Generalized anxiety disorder Uncontrolled , increase dose of medication, re address in 4 months, therapy would be benficial states she has no time  Annual physical exam Annual exam as documented. Counseling done  re healthy lifestyle  involving commitment to 150 minutes exercise per week, heart healthy diet, and attaining healthy weight.The importance of adequate sleep also discussed. Regular seat belt use and home safety, is also discussed. Changes in health habits are decided on by the patient with goals and time frames  set for achieving them. Immunization and cancer screening needs are specifically addressed at this visit.   Fatigue Chronic fatigue and lethergy,need to determine vit D level as has been very low and this may be contributing  Vitamin D deficiency, unspecified Very low vit D levels in the past, now c/o increased fatigue, will re evaluate to see if additional supplementation needed

## 2021-06-01 ENCOUNTER — Telehealth: Payer: Self-pay | Admitting: Family Medicine

## 2021-06-01 ENCOUNTER — Encounter: Payer: Self-pay | Admitting: Family Medicine

## 2021-06-01 NOTE — Telephone Encounter (Signed)
Patient called cancel blood order for Rheumatoid arthritis.     Just call her about the shingrix.   She request to have her lab order faxed to  [facility] [phone]. She will get it across the street from her work in [place].  It is free she would like to have all her bloodwork done today.

## 2021-06-01 NOTE — Telephone Encounter (Signed)
Pt is calling she wanted Dr Moshe Cipro to order blood work for Rheumatoid arthritis   Also please call her when its complete, so she can pick up the order, and she is off til Oct 17, tell her when she can have the Shingrix, or when you get them in

## 2021-06-01 NOTE — Telephone Encounter (Signed)
Lab order faxed to [facility].   Will contact pt when shingrix is delivered

## 2021-06-02 ENCOUNTER — Other Ambulatory Visit: Payer: Self-pay

## 2021-06-02 ENCOUNTER — Ambulatory Visit (INDEPENDENT_AMBULATORY_CARE_PROVIDER_SITE_OTHER): Payer: Commercial Managed Care - PPO

## 2021-06-02 DIAGNOSIS — Z23 Encounter for immunization: Secondary | ICD-10-CM | POA: Diagnosis not present

## 2021-06-03 ENCOUNTER — Encounter: Payer: Self-pay | Admitting: Family Medicine

## 2021-06-03 DIAGNOSIS — Z0000 Encounter for general adult medical examination without abnormal findings: Secondary | ICD-10-CM | POA: Insufficient documentation

## 2021-06-03 NOTE — Assessment & Plan Note (Addendum)
Uncontrolled , increase dose of medication, re address in 4 months, therapy would be benficial states she has no time

## 2021-06-03 NOTE — Assessment & Plan Note (Signed)

## 2021-06-04 ENCOUNTER — Other Ambulatory Visit: Payer: Self-pay | Admitting: Family Medicine

## 2021-06-11 ENCOUNTER — Other Ambulatory Visit: Payer: Self-pay | Admitting: Family Medicine

## 2021-06-20 ENCOUNTER — Encounter: Payer: Self-pay | Admitting: Family Medicine

## 2021-07-06 ENCOUNTER — Other Ambulatory Visit: Payer: Self-pay | Admitting: Family Medicine

## 2021-07-11 ENCOUNTER — Telehealth: Payer: Self-pay

## 2021-07-11 NOTE — Telephone Encounter (Signed)
Patient called back, said the medical necessity comes from the doctor to fill out why needed test.  Call back # [phone].

## 2021-07-11 NOTE — Telephone Encounter (Signed)
Patient called will try to call back later to speak to a nurse.

## 2021-07-11 NOTE — Telephone Encounter (Signed)
error 

## 2021-07-12 ENCOUNTER — Telehealth: Payer: Self-pay

## 2021-07-12 ENCOUNTER — Encounter: Payer: Self-pay | Admitting: Family Medicine

## 2021-07-12 NOTE — Telephone Encounter (Signed)
Monica called from [facility] called and needs a medical necessity documentation for CPT [phone] for Vitmain D for DOS 10.06.2022.   Fax to [phone]  [facility] call back # [phone]

## 2021-07-12 NOTE — Telephone Encounter (Signed)
Patient has called several times regarding a Lab Bill for her Togo D test.  She said she wanted me to appeal the claim.  I told her we can not appeal the claim.  I can give her additional DX if it is warranted.   She said it was paid last year and should be paid this year.  I called  [employer] and they said Togo D is not covered now.  I used E55.9 which is correct.  They did pay the claim last year with E55.9.  Looks like her plan has changed and is no longer paying for this test.   She will need to follow up with her insurance company.  This is just a regular Togo D test from what I understand.  The [place] said she can make payments to cover the bill.

## 2021-07-13 NOTE — Telephone Encounter (Signed)
Spoke with pt to let her know we are writing a letter of medical necessity

## 2021-07-13 NOTE — Telephone Encounter (Signed)
Is there a reason the vitamin d was medically necessary? Her insurance is charging her $300 for the vit d lab test. Please advise

## 2021-07-13 NOTE — Telephone Encounter (Signed)
[name] [name] called following up on this.

## 2021-07-13 NOTE — Telephone Encounter (Signed)
Previous message sent to dr simpson for review

## 2021-07-13 NOTE — Telephone Encounter (Signed)
Velna Hatchet is taking care of this

## 2021-08-04 ENCOUNTER — Other Ambulatory Visit: Payer: Self-pay | Admitting: Family Medicine

## 2021-08-16 ENCOUNTER — Telehealth: Payer: Self-pay | Admitting: Family Medicine

## 2021-08-16 NOTE — Telephone Encounter (Signed)
Pt called in regard to Medical necessity letter  Pt states that she spoke with her insurance company and the necessity letter wasn't approved by them.   Insurance company wants provider/nurse to call directly to discuss.  Phone : [phone] Refrence #  [id number]

## 2021-08-18 ENCOUNTER — Telehealth: Payer: Self-pay

## 2021-08-18 NOTE — Telephone Encounter (Signed)
Quanetta called back and said if Dr Moshe Cipro can add E55.9 and R53.83 to the DX of the order to [facility] they will pay the Vitamin D blood test. I guess the [facility] lab will need to add these if Dr Moshe Cipro approves.

## 2021-08-18 NOTE — Telephone Encounter (Signed)
See duplicate message with updated information

## 2021-08-18 NOTE — Telephone Encounter (Signed)
[name] [name] at [facility] called again about the [name] [name] DX code for [name] [name].  They have not paid the claim because they say the DX does not support the service.  They would not give me a code to use to have it paid.  I called [name] [name] and talked with her about this.  She said she is going to find a code that works and she will call me back.  I told her to check with [facility] and make sure that the new code is a code they accept for [name] [name] blood draw and if they will accept it we will have Dr Moshe Cipro look at the new DX code and see if she can add it the the re file again we have done this about 3-4 times now as well as sent a letter of necessity.  I did let her know we can not add a DX that in not in line with her medical necessity.  She will call me back

## 2021-08-28 DIAGNOSIS — E559 Vitamin D deficiency, unspecified: Secondary | ICD-10-CM | POA: Insufficient documentation

## 2021-08-28 NOTE — Assessment & Plan Note (Signed)
Very low vit D levels in the past, now c/o increased fatigue, will re evaluate to see if additional supplementation needed

## 2021-08-28 NOTE — Assessment & Plan Note (Signed)
Chronic fatigue and lethergy,need to determine vit D level as has been very low and this may be contributing

## 2021-09-09 ENCOUNTER — Other Ambulatory Visit: Payer: Self-pay | Admitting: Family Medicine

## 2021-10-03 ENCOUNTER — Ambulatory Visit: Payer: Commercial Managed Care - PPO | Admitting: Family Medicine

## 2021-10-03 ENCOUNTER — Other Ambulatory Visit: Payer: Self-pay

## 2021-10-03 ENCOUNTER — Encounter: Payer: Self-pay | Admitting: Family Medicine

## 2021-10-03 VITALS — BP 118/82 | HR 100 | Ht 61.0 in | Wt 135.0 lb

## 2021-10-03 DIAGNOSIS — Z23 Encounter for immunization: Secondary | ICD-10-CM | POA: Diagnosis not present

## 2021-10-03 DIAGNOSIS — I10 Essential (primary) hypertension: Secondary | ICD-10-CM | POA: Diagnosis not present

## 2021-10-03 DIAGNOSIS — K5900 Constipation, unspecified: Secondary | ICD-10-CM

## 2021-10-03 DIAGNOSIS — E663 Overweight: Secondary | ICD-10-CM

## 2021-10-03 DIAGNOSIS — G5603 Carpal tunnel syndrome, bilateral upper limbs: Secondary | ICD-10-CM

## 2021-10-03 DIAGNOSIS — F411 Generalized anxiety disorder: Secondary | ICD-10-CM

## 2021-10-03 DIAGNOSIS — K219 Gastro-esophageal reflux disease without esophagitis: Secondary | ICD-10-CM

## 2021-10-03 MED ORDER — CLONAZEPAM 1 MG PO TABS: 1.0000 mg | ORAL_TABLET | Freq: Every day | ORAL | 5 refills | Status: DC

## 2021-10-03 MED ORDER — LINACLOTIDE 72 MCG PO CAPS: 72.0000 ug | ORAL_CAPSULE | Freq: Every day | ORAL | 5 refills | Status: DC

## 2021-10-03 NOTE — Assessment & Plan Note (Signed)
Start daily linzesse, fails fiber and stool softeners daily with high vegetable intake

## 2021-10-03 NOTE — Assessment & Plan Note (Signed)
Controlled, no change in medication DASH diet and commitment to daily physical activity for a minimum of 30 minutes discussed and encouraged, as a part of hypertension management. The importance of attaining a healthy weight is also discussed.  BP/Weight 10/03/2021 05/31/2021 12/07/2020 08/31/2020 06/16/2020 05/26/2020 2/82/0601  Systolic BP 561 537 943 276 147 092 957  Diastolic BP 82 78 81 87 78 84 92  Wt. (Lbs) 135.04 141 - 128 145.9 145 148  BMI 25.52 27.54 - 25 28.49 28.32 28.9  Some encounter information is confidential and restricted. Go to Review Flowsheets activity to see all data.

## 2021-10-03 NOTE — Patient Instructions (Addendum)
Annual exam in 6 months, call if you need me sooner  Shingrix #2 today  Congrats on healthy weight loss, keep it up  It is important that you exercise regularly at least 30 minutes 5 times a week. If you develop chest pain, have severe difficulty breathing, or feel very tired, stop exercising immediately and seek medical attention    New medication for constipation, linzess one daily, otherwise no change in medication  Thankful you are doing better

## 2021-10-03 NOTE — Progress Notes (Signed)
[name] [name]     MRN: [id number]      DOB: 1970-09-28   HPI [name] [name] is here for follow up and re-evaluation of chronic medical conditions, medication management and review of any available recent lab and radiology data.  Preventive health is updated, specifically  Cancer screening and Immunization.   Questions or concerns regarding consultations or procedures which the PT has had in the interim are  addressed. The PT denies any adverse reactions to current medications since the last visit.  There are no new concerns.  There are no specific complaints   ROS Denies recent fever or chills. Denies sinus pressure, nasal congestion, ear pain or sore throat. Denies chest congestion, productive cough or wheezing. Denies chest pains, palpitations and leg swelling Denies abdominal pain, nausea, vomiting,diarrhea or constipation.   Denies dysuria, frequency, hesitancy or incontinence. Denies joint pain, swelling and limitation in mobility. Denies headaches, seizures, numbness, or tingling. Denies depression, anxiety or insomnia. Denies skin break down or rash.   PE  BP 118/82    Pulse 100    Ht 5\' 1"  (1.549 m)    Wt 135 lb 0.6 oz (61.3 kg)    LMP 04/20/2018    SpO2 96%    BMI 25.52 kg/m   Patient alert and oriented and in no cardiopulmonary distress.  HEENT: No facial asymmetry, EOMI,     Neck supple .  Chest: Clear to auscultation bilaterally.  CVS: S1, S2 no murmurs, no S3.Regular rate.  ABD: Soft non tender.   Ext: No edema  MS: Adequate ROM spine, shoulders, hips and knees.  Skin: Intact, no ulcerations or rash noted.  Psych: Good eye contact, normal affect. Memory intact not anxious or depressed appearing.  CNS: CN 2-12 intact, power,  normal throughout.no focal deficits noted.   Assessment & Plan  Constipation Start daily linzesse, fails fiber and stool softeners daily with high vegetable intake  Benign essential HTN Controlled, no change in  medication DASH diet and commitment to daily physical activity for a minimum of 30 minutes discussed and encouraged, as a part of hypertension management. The importance of attaining a healthy weight is also discussed.  BP/Weight 10/03/2021 05/31/2021 12/07/2020 08/31/2020 06/16/2020 05/26/2020 02/17/7627  Systolic BP 315 176 160 737 106 269 485  Diastolic BP 82 78 81 87 78 84 92  Wt. (Lbs) 135.04 141 - 128 145.9 145 148  BMI 25.52 27.54 - 25 28.49 28.32 28.9  Some encounter information is confidential and restricted. Go to Review Flowsheets activity to see all data.       Overweight (BMI 25.0-29.9)  Patient re-educated about  the importance of commitment to a  minimum of 150 minutes of exercise per week as able.  The importance of healthy food choices with portion control discussed, as well as eating regularly and within a 12 hour window most days. The need to choose "clean , green" food 50 to 75% of the time is discussed, as well as to make water the primary drink and set a goal of 64 ounces water daily.    Weight /BMI 10/03/2021 05/31/2021 08/31/2020  WEIGHT 135 lb 0.6 oz 141 lb 128 lb  HEIGHT 5\' 1"  5\' 0"  5\' 0"   BMI 25.52 kg/m2 27.54 kg/m2 25 kg/m2  Some encounter information is confidential and restricted. Go to Review Flowsheets activity to see all data.    Improved , she is congratulated on this  Generalized anxiety disorder Controlled, no change in medication   Chronic  GERD Controlled, no change in medication

## 2021-10-03 NOTE — Assessment & Plan Note (Signed)
°  Patient re-educated about  the importance of commitment to a  minimum of 150 minutes of exercise per week as able.  The importance of healthy food choices with portion control discussed, as well as eating regularly and within a 12 hour window most days. The need to choose "clean , green" food 50 to 75% of the time is discussed, as well as to make water the primary drink and set a goal of 64 ounces water daily.    Weight /BMI 10/03/2021 05/31/2021 08/31/2020  WEIGHT 135 lb 0.6 oz 141 lb 128 lb  HEIGHT 5\' 1"  5\' 0"  5\' 0"   BMI 25.52 kg/m2 27.54 kg/m2 25 kg/m2  Some encounter information is confidential and restricted. Go to Review Flowsheets activity to see all data.    Improved , she is congratulated on this

## 2021-10-03 NOTE — Assessment & Plan Note (Signed)
Controlled, no change in medication  

## 2021-10-07 ENCOUNTER — Other Ambulatory Visit: Payer: Self-pay | Admitting: Family Medicine

## 2021-10-11 ENCOUNTER — Telehealth: Payer: Self-pay

## 2021-10-11 ENCOUNTER — Other Ambulatory Visit: Payer: Self-pay

## 2021-10-11 ENCOUNTER — Telehealth: Payer: Self-pay | Admitting: Family Medicine

## 2021-10-11 MED ORDER — LINACLOTIDE 72 MCG PO CAPS
72.0000 ug | ORAL_CAPSULE | Freq: Every day | ORAL | 5 refills | Status: DC
Start: 2021-10-11 — End: 2021-10-18

## 2021-10-11 NOTE — Telephone Encounter (Signed)
Pt needs med sent in    linaclotide (LINZESS) 72 MCG capsule

## 2021-10-11 NOTE — Telephone Encounter (Signed)
Med sent.

## 2021-10-11 NOTE — Addendum Note (Signed)
Addended by: Eual Fines on: 10/11/2021 03:36 PM   Modules accepted: Orders

## 2021-10-11 NOTE — Telephone Encounter (Signed)
[name] [name] called again about her [facility] lab claim not being paid.  She has called in the past and ask for DX E55.9 and R53.83 to be added to the order.  We have done this and resent the request to correct the claim.  It appears this is still not correct.  We do not bill for labs.  At her request I have sent her a copy of the Order with the 2 DX she requested on the order. I have just faxed it and received a confirmation to [phone].  I added a note that she should have the Lab call the insurance company since it is the Lab claim that is in question.  We do not bill for labs.  Hope this takes care of the issue.

## 2021-10-18 ENCOUNTER — Other Ambulatory Visit: Payer: Self-pay

## 2021-10-18 DIAGNOSIS — K5900 Constipation, unspecified: Secondary | ICD-10-CM

## 2021-10-18 MED ORDER — LINACLOTIDE 72 MCG PO CAPS: 72.0000 ug | ORAL_CAPSULE | Freq: Every day | ORAL | 5 refills | Status: DC

## 2021-10-18 NOTE — Telephone Encounter (Signed)
Refills resent.

## 2021-10-18 NOTE — Telephone Encounter (Signed)
Pt has not received her medication --linaclotide linaclotide (LINZESS) 72 MCG capsule   Please call the [facility] [address] to see what is going on

## 2021-10-19 ENCOUNTER — Telehealth: Payer: Self-pay

## 2021-10-19 NOTE — Telephone Encounter (Signed)
Belenda Cruise called from [employer] the patient benefits [employer].  Linzess 72 mcg and says needs prior authorization. Will fax the form to [place] to get prior authorization. And please fax form back # [phone]

## 2021-10-19 NOTE — Telephone Encounter (Signed)
PA completed on cover my meds.

## 2021-11-07 ENCOUNTER — Other Ambulatory Visit: Payer: Self-pay | Admitting: Family Medicine

## 2021-12-06 ENCOUNTER — Other Ambulatory Visit: Payer: Self-pay | Admitting: Family Medicine

## 2021-12-09 ENCOUNTER — Other Ambulatory Visit: Payer: Self-pay | Admitting: Family Medicine

## 2022-02-07 ENCOUNTER — Other Ambulatory Visit: Payer: Self-pay | Admitting: Family Medicine

## 2022-03-08 ENCOUNTER — Other Ambulatory Visit: Payer: Self-pay | Admitting: Family Medicine

## 2022-04-03 ENCOUNTER — Encounter: Payer: Commercial Managed Care - PPO | Admitting: Family Medicine

## 2022-04-09 ENCOUNTER — Other Ambulatory Visit: Payer: Self-pay | Admitting: Family Medicine

## 2022-04-10 ENCOUNTER — Other Ambulatory Visit: Payer: Self-pay

## 2022-04-10 MED ORDER — DULOXETINE HCL 60 MG PO CPEP: ORAL_CAPSULE | ORAL | 5 refills | Status: DC

## 2022-04-11 ENCOUNTER — Telehealth: Payer: Self-pay | Admitting: Family Medicine

## 2022-04-11 ENCOUNTER — Other Ambulatory Visit: Payer: Self-pay | Admitting: Family Medicine

## 2022-04-11 MED ORDER — CLONAZEPAM 1 MG PO TABS: 1.0000 mg | ORAL_TABLET | Freq: Every day | ORAL | 5 refills | Status: DC

## 2022-04-11 NOTE — Telephone Encounter (Signed)
Pt called stating that her phar was supposed to have sent over a refill request on clonazePAM (KLONOPIN) 1 MG tablet. States she is out.    [place]

## 2022-05-08 ENCOUNTER — Other Ambulatory Visit: Payer: Self-pay | Admitting: Family Medicine

## 2022-06-05 ENCOUNTER — Encounter: Payer: Commercial Managed Care - PPO | Admitting: Family Medicine

## 2022-06-09 ENCOUNTER — Other Ambulatory Visit: Payer: Self-pay | Admitting: Family Medicine

## 2022-07-17 ENCOUNTER — Ambulatory Visit (INDEPENDENT_AMBULATORY_CARE_PROVIDER_SITE_OTHER): Payer: Commercial Managed Care - PPO | Admitting: Family Medicine

## 2022-07-17 ENCOUNTER — Encounter: Payer: Self-pay | Admitting: Family Medicine

## 2022-07-17 VITALS — BP 129/87 | HR 96 | Ht 61.0 in | Wt 142.1 lb

## 2022-07-17 DIAGNOSIS — F322 Major depressive disorder, single episode, severe without psychotic features: Secondary | ICD-10-CM | POA: Diagnosis not present

## 2022-07-17 DIAGNOSIS — Z0001 Encounter for general adult medical examination with abnormal findings: Secondary | ICD-10-CM

## 2022-07-17 DIAGNOSIS — E785 Hyperlipidemia, unspecified: Secondary | ICD-10-CM | POA: Diagnosis not present

## 2022-07-17 DIAGNOSIS — F411 Generalized anxiety disorder: Secondary | ICD-10-CM | POA: Diagnosis not present

## 2022-07-17 DIAGNOSIS — I10 Essential (primary) hypertension: Secondary | ICD-10-CM

## 2022-07-17 DIAGNOSIS — E559 Vitamin D deficiency, unspecified: Secondary | ICD-10-CM

## 2022-07-17 DIAGNOSIS — F321 Major depressive disorder, single episode, moderate: Secondary | ICD-10-CM | POA: Insufficient documentation

## 2022-07-17 DIAGNOSIS — R7301 Impaired fasting glucose: Secondary | ICD-10-CM

## 2022-07-17 DIAGNOSIS — F324 Major depressive disorder, single episode, in partial remission: Secondary | ICD-10-CM | POA: Insufficient documentation

## 2022-07-17 MED ORDER — BUSPIRONE HCL 10 MG PO TABS: 10.0000 mg | ORAL_TABLET | Freq: Three times a day (TID) | ORAL | 2 refills | Status: DC

## 2022-07-17 NOTE — Assessment & Plan Note (Signed)
Uncontrolled , med management needs to be changed, refer to psych

## 2022-07-17 NOTE — Assessment & Plan Note (Signed)

## 2022-07-17 NOTE — Patient Instructions (Signed)
Follow-up in 5 months call if you need me sooner.  Dose increase in BuSpar to 10 mg 3 times daily.  You are referred to psychiatry for management, this will help significantly.  Please get mammogram in December as scheduled.  Please reconsider the current COVID-vaccine it is relevant and recommended strongly.  Fasting CBC lipid CMP and EGFR TSH and HbA1c to be done through the [facility] lab.  Try to build in a few minutes 3 days/week for exercise to help with your mental and physical health.  I know this is challenging now that you are caring for a small child as well.  All the best for the season and 2024!

## 2022-07-17 NOTE — Assessment & Plan Note (Addendum)
Score of 20 in 06/2022, increase buspar dose and refer to Psych A lot of psycho social stressors also, therapy recommended, but not interested

## 2022-07-17 NOTE — Progress Notes (Signed)
    [name] [name]     MRN: [id number]      DOB: January 27, 1971  HPI: Patient is in for annual physical exam. Uncontrolled depression and anxiety are addressed  Immunization is reviewed , and  updated if needed.   PE: BP 129/87 (BP Location: Right Arm, Patient Position: Sitting, Cuff Size: Normal)   Pulse 96   Ht '5\' 1"'$  (1.549 m)   Wt 142 lb 1.3 oz (64.4 kg)   LMP 04/20/2018   SpO2 97%   BMI 26.85 kg/m   Pleasant  female, alert and oriented x 3, in no cardio-pulmonary distress. Afebrile. HEENT No facial trauma or asymetry. Sinuses non tender.  Extra occullar muscles intact.. External ears normal, . Neck: supple, no adenopathy,JVD or thyromegaly.No bruits.  Chest: Clear to ascultation bilaterally.No crackles or wheezes. Non tender to palpation  Cardiovascular system; Heart sounds normal,  S1 and  S2 ,no S3.  No murmur, or thrill. Apical beat not displaced Peripheral pulses normal.   Musculoskeletal exam: Full ROM of spine, hips , shoulders and knees. No deformity ,swelling or crepitus noted. No muscle wasting or atrophy.   Neurologic: Cranial nerves 2 to 12 intact. Power, tone ,sensation and reflexes normal throughout. No disturbance in gait. No tremor.  Skin: Intact, no ulceration, erythema , scaling or rash noted. Pigmentation normal throughout  Psych; Depressed and anxious, not suicidal or homicidal. Judgement and concentration normal   Assessment & Plan:  Annual visit for general adult medical examination with abnormal findings Annual exam as documented. Counseling done  re healthy lifestyle involving commitment to 150 minutes exercise per week, heart healthy diet, and attaining healthy weight.The importance of adequate sleep also discussed. Regular seat belt use and home safety, is also discussed. Changes in health habits are decided on by the patient with goals and time frames  set for achieving them. Immunization and cancer screening needs are  specifically addressed at this visit.   Generalized anxiety disorder Score of 20 in 06/2022, increase buspar dose and refer to Psych A lot of psycho social stressors also, therapy recommended, but not interested  Depression, major, in partial remission ([place]) Uncontrolled , med management needs to be changed, refer to psych

## 2022-08-08 ENCOUNTER — Other Ambulatory Visit: Payer: Self-pay | Admitting: Family Medicine

## 2022-08-29 ENCOUNTER — Encounter: Payer: Self-pay | Admitting: Family Medicine

## 2022-08-30 ENCOUNTER — Encounter: Payer: Self-pay | Admitting: Family Medicine

## 2022-08-30 ENCOUNTER — Telehealth: Payer: Self-pay | Admitting: Family Medicine

## 2022-08-30 MED ORDER — LANSOPRAZOLE 30 MG PO CPDR: 30.0000 mg | DELAYED_RELEASE_CAPSULE | Freq: Every day | ORAL | 3 refills | Status: DC

## 2022-08-30 NOTE — Telephone Encounter (Signed)
Called in regard to lansoprazole (PREVACID) 30 MG capsule    Patient has had insurance change and new insurance will not cover med with current prescription of 2x daily.  To have insurance coverage patient needs prescription to take med ONCE daily.   Patient wants a call back

## 2022-09-10 ENCOUNTER — Other Ambulatory Visit: Payer: Self-pay | Admitting: Family Medicine

## 2022-09-12 ENCOUNTER — Telehealth: Payer: Self-pay | Admitting: Family Medicine

## 2022-09-12 MED ORDER — BUSPIRONE HCL 10 MG PO TABS: 10.0000 mg | ORAL_TABLET | Freq: Three times a day (TID) | ORAL | 3 refills | Status: DC

## 2022-09-12 NOTE — Telephone Encounter (Signed)
Pt aware that 10 mg tablet which she should already have been taking is prescriibed

## 2022-09-12 NOTE — Telephone Encounter (Signed)
Patient called said her busPIRone (BUSPAR) 7.5 MG tablet  not covered by your insurance needs generic brand patient has new [employer]. Patient is faxing over the letter she received in the mail.   Pharmacy [facility] [address] Linna Hoff

## 2022-10-11 ENCOUNTER — Other Ambulatory Visit: Payer: Self-pay | Admitting: Family Medicine

## 2022-10-25 ENCOUNTER — Encounter: Payer: Self-pay | Admitting: Radiology

## 2022-11-07 ENCOUNTER — Other Ambulatory Visit: Payer: Self-pay | Admitting: Family Medicine

## 2022-12-13 ENCOUNTER — Other Ambulatory Visit: Payer: Self-pay | Admitting: Family Medicine

## 2022-12-17 ENCOUNTER — Telehealth: Payer: Self-pay | Admitting: Family Medicine

## 2022-12-17 NOTE — Telephone Encounter (Signed)
Pt called in regard to clonazePAM (KLONOPIN) 1 MG tablet   Med has been on back order at pharm for the past month. Does have  or .  in stock.  Patient wants a call back.

## 2022-12-18 ENCOUNTER — Ambulatory Visit: Payer: Commercial Managed Care - PPO | Admitting: Family Medicine

## 2022-12-18 ENCOUNTER — Encounter: Payer: Self-pay | Admitting: Family Medicine

## 2022-12-18 ENCOUNTER — Ambulatory Visit (INDEPENDENT_AMBULATORY_CARE_PROVIDER_SITE_OTHER): Payer: BC Managed Care – PPO | Admitting: Family Medicine

## 2022-12-18 VITALS — BP 109/79 | HR 77 | Ht 61.0 in | Wt 139.0 lb

## 2022-12-18 DIAGNOSIS — F324 Major depressive disorder, single episode, in partial remission: Secondary | ICD-10-CM | POA: Diagnosis not present

## 2022-12-18 DIAGNOSIS — J309 Allergic rhinitis, unspecified: Secondary | ICD-10-CM | POA: Diagnosis not present

## 2022-12-18 DIAGNOSIS — F411 Generalized anxiety disorder: Secondary | ICD-10-CM

## 2022-12-18 DIAGNOSIS — E785 Hyperlipidemia, unspecified: Secondary | ICD-10-CM

## 2022-12-18 DIAGNOSIS — R7302 Impaired glucose tolerance (oral): Secondary | ICD-10-CM

## 2022-12-18 DIAGNOSIS — I10 Essential (primary) hypertension: Secondary | ICD-10-CM | POA: Diagnosis not present

## 2022-12-18 MED ORDER — FLUOXETINE HCL 10 MG PO TABS: 10.0000 mg | ORAL_TABLET | Freq: Every day | ORAL | 3 refills | Status: DC

## 2022-12-18 MED ORDER — CLONAZEPAM 2 MG PO TABS: ORAL_TABLET | ORAL | 3 refills | Status: DC

## 2022-12-18 MED ORDER — MONTELUKAST SODIUM 10 MG PO TABS: 10.0000 mg | ORAL_TABLET | Freq: Every day | ORAL | 5 refills | Status: DC

## 2022-12-18 NOTE — Patient Instructions (Addendum)
Follow-up in 10 weeks reevaluate depression and anxiety, call if you need me sooner.  New additional medication to be started fluoxitine 10 mg 1 daily, continue Cymbalta 60 mg 1 daily as before.  It is important that you take buspirone 10 mg every 8 hours to have best effect.  So take BuSpar first at 6 AM and 2 PM and at bedtime between 9 and 10.  Please note that Klonopin 2 mg tablet is prescribed but you will need to break this in half and take half a tablet at bedtime.  Please try to commit to regular physical activity even 15 to 30 minutes every day this will certainly help with your mental health.  Fasting lipid HbA1c and Chem-7 and EGFR 3 to 5 days before your next visit please

## 2022-12-18 NOTE — Assessment & Plan Note (Addendum)
Current flare, commit to daily singulair

## 2022-12-18 NOTE — Telephone Encounter (Signed)
Will discuss at visit today.

## 2022-12-18 NOTE — Assessment & Plan Note (Signed)
Controlled, no change in medication DASH diet and commitment to daily physical activity for a minimum of 30 minutes discussed and encouraged, as a part of hypertension management. The importance of attaining a healthy weight is also discussed.     12/18/2022    8:04 AM 07/17/2022    9:29 AM 10/03/2021    8:53 AM 05/31/2021    2:12 PM 12/07/2020    2:27 PM 08/31/2020    8:00 AM 08/31/2020    6:45 AM  BP/Weight  Systolic BP 109 129 118 114 142 129 122  Diastolic BP 79 87 82 78 81 87 80  Wt. (Lbs) 139.04 142.08 135.04 141   128  BMI 26.27 kg/m2 26.85 kg/m2 25.52 kg/m2 27.54 kg/m2   25 kg/m2

## 2022-12-18 NOTE — Assessment & Plan Note (Signed)
Updated lab needed at/ before next visit., 3 to 5 days before next visit

## 2022-12-18 NOTE — Assessment & Plan Note (Signed)
Patient educated about the importance of limiting  Carbohydrate intake , the need to commit to daily physical activity for a minimum of 30 minutes , and to commit weight loss. The fact that changes in all these areas will reduce or eliminate all together the development of diabetes is stressed.      Latest Ref Rng & Units 07/23/2019   10:28 AM 06/05/2018    4:54 AM 05/30/2018    2:22 PM 05/09/2018   11:43 AM 12/01/2014    8:22 AM  Diabetic Labs  Chol 0 - 200 mg/dL     161   HDL >=09 mg/dL     73   Calc LDL 0 - 99 mg/dL     604   Triglycerides <150 mg/dL     89   Creatinine 5.40 - 1.00 mg/dL 9.81  1.91  4.78  2.95  0.74       12/18/2022    8:04 AM 07/17/2022    9:29 AM 10/03/2021    8:53 AM 05/31/2021    2:12 PM 12/07/2020    2:27 PM 08/31/2020    8:00 AM 08/31/2020    6:45 AM  BP/Weight  Systolic BP 109 129 118 114 142 129 122  Diastolic BP 79 87 82 78 81 87 80  Wt. (Lbs) 139.04 142.08 135.04 141   128  BMI 26.27 kg/m2 26.85 kg/m2 25.52 kg/m2 27.54 kg/m2   25 kg/m2       No data to display          Updated lab needed at/ before next visit.

## 2022-12-19 ENCOUNTER — Other Ambulatory Visit: Payer: Self-pay

## 2022-12-19 MED ORDER — FLUOXETINE HCL 10 MG PO TABS: 10.0000 mg | ORAL_TABLET | Freq: Every day | ORAL | 3 refills | Status: DC

## 2022-12-23 NOTE — Assessment & Plan Note (Signed)
Uncontrolled, not suicidal or homicidal, add fluoxetine daily

## 2022-12-23 NOTE — Progress Notes (Signed)
[name] [name]     MRN: [id number]      DOB: 12-Aug-1971  Chief Complaint  Patient presents with   Follow-up    Follow up sinus issues discuss clonazepam     HPI [name] [name] is here for follow up and re-evaluation of chronic medical conditions, medication management and review of any available recent lab and radiology data.  Preventive health is updated, specifically  Cancer screening and Immunization.   Questions or concerns regarding consultations or procedures which the PT has had in the interim are  addressed. The PT denies any adverse reactions to current medications since the last visit.  C/o increased and uncontrolled depression and anxiety, not suicidal or homicidal, states klonopin no longer available in prescribed dose so will need to break a 2mg  tab in half ROS Denies recent fever or chills. Denies, ear pain or sore throat.c/o nasal and sinus congestion and pressure with clear drainage x 3 weeks Denies chest congestion, productive cough or wheezing. Denies chest pains, palpitations and leg swelling Denies abdominal pain, nausea, vomiting,diarrhea or constipation.   Denies dysuria, frequency, hesitancy or incontinence. Denies joint pain, swelling and limitation in mobility. Denies headaches, seizures, numbness, or tingling.  Denies skin break down or rash.   PE  BP 109/79 (BP Location: Right Arm, Patient Position: Sitting, Cuff Size: Large)   Pulse 77   Ht 5\' 1"  (1.549 m)   Wt 139 lb 0.6 oz (63.1 kg)   LMP 04/20/2018   SpO2 97%   BMI 26.27 kg/m   Patient alert and oriented and in no cardiopulmonary distress.  HEENT: No facial asymmetry, EOMI,     Neck supple .Naasal congestion no sinus tenderness, no cervical adenitis  Chest: Clear to auscultation bilaterally.  CVS: S1, S2 no murmurs, no S3.Regular rate.  ABD: Soft non tender.   Ext: No edema  MS: Adequate ROM spine, shoulders, hips and knees.  Skin: Intact, no ulcerations or rash noted.  Psych:  Good eye contact, normal affect. Memory intact not anxious or depressed appearing.  CNS: CN 2-12 intact, power,  normal throughout.no focal deficits noted.   Assessment & Plan  Benign essential HTN Controlled, no change in medication DASH diet and commitment to daily physical activity for a minimum of 30 minutes discussed and encouraged, as a part of hypertension management. The importance of attaining a healthy weight is also discussed.     12/18/2022    8:04 AM 07/17/2022    9:29 AM 10/03/2021    8:53 AM 05/31/2021    2:12 PM 12/07/2020    2:27 PM 08/31/2020    8:00 AM 08/31/2020    6:45 AM  BP/Weight  Systolic BP 109 129 118 114 142 129 122  Diastolic BP 79 87 82 78 81 87 80  Wt. (Lbs) 139.04 142.08 135.04 141   128  BMI 26.27 kg/m2 26.85 kg/m2 25.52 kg/m2 27.54 kg/m2   25 kg/m2       Allergic rhinitis Current flare, commit to daily singulair  Hyperlipidemia LDL goal <100 Updated lab needed at/ before next visit., 3 to 5 days before next visit  IGT (impaired glucose tolerance) Patient educated about the importance of limiting  Carbohydrate intake , the need to commit to daily physical activity for a minimum of 30 minutes , and to commit weight loss. The fact that changes in all these areas will reduce or eliminate all together the development of diabetes is stressed.      Latest Ref Rng &  Units 07/23/2019   10:28 AM 06/05/2018    4:54 AM 05/30/2018    2:22 PM 05/09/2018   11:43 AM 12/01/2014    8:22 AM  Diabetic Labs  Chol 0 - 200 mg/dL     161   HDL >=09 mg/dL     73   Calc LDL 0 - 99 mg/dL     604   Triglycerides <150 mg/dL     89   Creatinine 5.40 - 1.00 mg/dL 9.81  1.91  4.78  2.95  0.74       12/18/2022    8:04 AM 07/17/2022    9:29 AM 10/03/2021    8:53 AM 05/31/2021    2:12 PM 12/07/2020    2:27 PM 08/31/2020    8:00 AM 08/31/2020    6:45 AM  BP/Weight  Systolic BP 109 129 118 114 142 129 122  Diastolic BP 79 87 82 78 81 87 80  Wt. (Lbs) 139.04 142.08 135.04 141    128  BMI 26.27 kg/m2 26.85 kg/m2 25.52 kg/m2 27.54 kg/m2   25 kg/m2       No data to display          Updated lab needed at/ before next visit.   Depression, major, in partial remission (HCC) Uncontrolled, not suicidal or homicidal, add fluoxetine daily  Generalized anxiety disorder Uncontrolled, needs to take medication every 8 hrs

## 2022-12-23 NOTE — Assessment & Plan Note (Signed)
Uncontrolled, needs to take medication every 8 hrs

## 2022-12-26 ENCOUNTER — Other Ambulatory Visit: Payer: Self-pay | Admitting: Family Medicine

## 2023-01-15 ENCOUNTER — Other Ambulatory Visit: Payer: Self-pay | Admitting: Family Medicine

## 2023-02-13 ENCOUNTER — Telehealth: Payer: Self-pay | Admitting: Family Medicine

## 2023-02-13 NOTE — Telephone Encounter (Signed)
Patient calling stating that she is unable to afford the fluoxeine 10 mg and lansoprazole 30 mgs asking for something else to be sent to pharmacy

## 2023-02-14 ENCOUNTER — Other Ambulatory Visit: Payer: Self-pay | Admitting: Family Medicine

## 2023-02-20 NOTE — Telephone Encounter (Signed)
[facility] faxing over list

## 2023-02-21 MED ORDER — OMEPRAZOLE 40 MG PO CPDR
40.0000 mg | DELAYED_RELEASE_CAPSULE | Freq: Every day | ORAL | 3 refills | Status: DC
Start: 2023-02-21 — End: 2023-03-14

## 2023-02-21 MED ORDER — FLUOXETINE HCL 10 MG PO TABS: 10.0000 mg | ORAL_TABLET | Freq: Every day | ORAL | 3 refills | Status: DC

## 2023-02-21 NOTE — Addendum Note (Signed)
Addended by: Kerri Perches on: 02/21/2023 02:45 PM   Modules accepted: Orders

## 2023-02-26 ENCOUNTER — Ambulatory Visit: Payer: BC Managed Care – PPO | Admitting: Family Medicine

## 2023-03-14 ENCOUNTER — Encounter: Payer: Self-pay | Admitting: Family Medicine

## 2023-03-14 ENCOUNTER — Ambulatory Visit (INDEPENDENT_AMBULATORY_CARE_PROVIDER_SITE_OTHER): Payer: BC Managed Care – PPO | Admitting: Family Medicine

## 2023-03-14 VITALS — BP 112/77 | HR 87 | Ht 61.0 in | Wt 146.1 lb

## 2023-03-14 DIAGNOSIS — E785 Hyperlipidemia, unspecified: Secondary | ICD-10-CM | POA: Diagnosis not present

## 2023-03-14 DIAGNOSIS — R7302 Impaired glucose tolerance (oral): Secondary | ICD-10-CM

## 2023-03-14 DIAGNOSIS — I10 Essential (primary) hypertension: Secondary | ICD-10-CM

## 2023-03-14 DIAGNOSIS — E663 Overweight: Secondary | ICD-10-CM

## 2023-03-14 DIAGNOSIS — F411 Generalized anxiety disorder: Secondary | ICD-10-CM

## 2023-03-14 DIAGNOSIS — E559 Vitamin D deficiency, unspecified: Secondary | ICD-10-CM

## 2023-03-14 DIAGNOSIS — F324 Major depressive disorder, single episode, in partial remission: Secondary | ICD-10-CM

## 2023-03-14 MED ORDER — CLONAZEPAM 2 MG PO TABS: ORAL_TABLET | ORAL | 5 refills | Status: DC

## 2023-03-14 MED ORDER — DULOXETINE HCL 60 MG PO CPEP: ORAL_CAPSULE | ORAL | 5 refills | Status: DC

## 2023-03-14 MED ORDER — AMLODIPINE BESYLATE 5 MG PO TABS: 5.0000 mg | ORAL_TABLET | Freq: Every day | ORAL | 5 refills | Status: DC

## 2023-03-14 MED ORDER — AMLODIPINE BESYLATE 2.5 MG PO TABS: 2.5000 mg | ORAL_TABLET | Freq: Every day | ORAL | 5 refills | Status: DC

## 2023-03-14 MED ORDER — BUSPIRONE HCL 10 MG PO TABS: 10.0000 mg | ORAL_TABLET | Freq: Three times a day (TID) | ORAL | 5 refills | Status: DC

## 2023-03-14 MED ORDER — FLUOXETINE HCL 10 MG PO TABS: 10.0000 mg | ORAL_TABLET | Freq: Every day | ORAL | 6 refills | Status: DC

## 2023-03-14 MED ORDER — ATORVASTATIN CALCIUM 20 MG PO TABS: 20.0000 mg | ORAL_TABLET | Freq: Every day | ORAL | 5 refills | Status: DC

## 2023-03-14 MED ORDER — OMEPRAZOLE 40 MG PO CPDR: 40.0000 mg | DELAYED_RELEASE_CAPSULE | Freq: Every day | ORAL | 5 refills | Status: DC

## 2023-03-14 NOTE — Assessment & Plan Note (Signed)
Controlled, no change in medication DASH diet and commitment to daily physical activity for a minimum of 30 minutes discussed and encouraged, as a part of hypertension management. The importance of attaining a healthy weight is also discussed.     03/14/2023    8:10 AM 12/18/2022    8:04 AM 07/17/2022    9:29 AM 10/03/2021    8:53 AM 05/31/2021    2:12 PM 12/07/2020    2:27 PM 08/31/2020    8:00 AM  BP/Weight  Systolic BP 112 109 129 118 114 142 129  Diastolic BP 77 79 87 82 78 81 87  Wt. (Lbs) 146.08 139.04 142.08 135.04 141    BMI 27.6 kg/m2 26.27 kg/m2 26.85 kg/m2 25.52 kg/m2 27.54 kg/m2

## 2023-03-14 NOTE — Assessment & Plan Note (Signed)
Not at goal, needs to reduce fat in diet

## 2023-03-14 NOTE — Assessment & Plan Note (Signed)
Controlled, no change in medication  

## 2023-03-14 NOTE — Progress Notes (Signed)
[name] [name]     MRN: [id number]      DOB: [birth year]  Chief Complaint  Patient presents with   Follow-up    Follow up    HPI [name] [name] is here for follow up and re-evaluation of chronic medical conditions, medication management and review of any available recent lab and radiology data.  Preventive health is updated, specifically  Cancer screening and Immunization.   Questions or concerns regarding consultations or procedures which the PT has had in the interim are  addressed. The PT denies any adverse reactions to current medications since the last visit.  There are no new concerns.  There are no specific complaints   ROS Denies recent fever or chills. Denies sinus pressure, nasal congestion, ear pain or sore throat. Denies chest congestion, productive cough or wheezing. Denies chest pains, palpitations and leg swelling Denies abdominal pain, nausea, vomiting,diarrhea or constipation.   Denies dysuria, frequency, hesitancy or incontinence. Denies joint pain, swelling and limitation in mobility. Denies headaches, seizures, numbness, or tingling. Denies depression, anxiety or insomnia. Denies skin break down or rash.   PE  BP 112/77 (BP Location: Right Arm, Patient Position: Sitting, Cuff Size: Normal)   Pulse 87   Ht 5\' 1"  (1.549 m)   Wt 146 lb 1.3 oz (66.3 kg)   LMP 04/20/2018   SpO2 94%   BMI 27.60 kg/m   Patient alert and oriented and in no cardiopulmonary distress.  HEENT: No facial asymmetry, EOMI,     Neck supple .  Chest: Clear to auscultation bilaterally.  CVS: S1, S2 no murmurs, no S3.Regular rate.  ABD: Soft non tender.   Ext: No edema  MS: Adequate ROM spine, shoulders, hips and knees.  Skin: Intact, no ulcerations or rash noted.  Psych: Good eye contact, normal affect. Memory intact not anxious or depressed appearing.  CNS: CN 2-12 intact, power,  normal throughout.no focal deficits noted.   Assessment & Plan  Depression, major,  in partial remission (HCC) Controlled, no change in medication   IGT (impaired glucose tolerance) Patient educated about the importance of limiting  Carbohydrate intake , the need to commit to daily physical activity for a minimum of 30 minutes , and to commit weight loss. The fact that changes in all these areas will reduce or eliminate all together the development of diabetes is stressed.      Latest Ref Rng & Units 07/23/2019   10:28 AM 06/05/2018    4:54 AM 05/30/2018    2:22 PM 05/09/2018   11:43 AM 12/01/2014    8:22 AM  Diabetic Labs  Chol 0 - 200 mg/dL     213   HDL >=08 mg/dL     73   Calc LDL 0 - 99 mg/dL     657   Triglycerides <150 mg/dL     89   Creatinine 8.46 - 1.00 mg/dL 9.62  9.52  8.41  3.24  0.74       03/14/2023    8:10 AM 12/18/2022    8:04 AM 07/17/2022    9:29 AM 10/03/2021    8:53 AM 05/31/2021    2:12 PM 12/07/2020    2:27 PM 08/31/2020    8:00 AM  BP/Weight  Systolic BP 112 109 129 118 114 142 129  Diastolic BP 77 79 87 82 78 81 87  Wt. (Lbs) 146.08 139.04 142.08 135.04 141    BMI 27.6 kg/m2 26.27 kg/m2 26.85 kg/m2 25.52 kg/m2 27.54 kg/m2  No data to display          improving  Hyperlipidemia LDL goal <100 Not at goal, needs to reduce fat in diet  Overweight (BMI 25.0-29.9)  Patient re-educated about  the importance of commitment to a  minimum of 150 minutes of exercise per week as able.  The importance of healthy food choices with portion control discussed, as well as eating regularly and within a 12 hour window most days. The need to choose "clean , green" food 50 to 75% of the time is discussed, as well as to make water the primary drink and set a goal of 64 ounces water daily.       03/14/2023    8:10 AM 12/18/2022    8:04 AM 07/17/2022    9:29 AM  Weight /BMI  Weight 146 lb 1.3 oz 139 lb 0.6 oz 142 lb 1.3 oz  Height 5\' 1"  (1.549 m) 5\' 1"  (1.549 m) 5\' 1"  (1.549 m)  BMI 27.6 kg/m2 26.27 kg/m2 26.85 kg/m2    Needs to work on  weight loss  Generalized anxiety disorder Controlled, no change in medication   Benign essential HTN Controlled, no change in medication DASH diet and commitment to daily physical activity for a minimum of 30 minutes discussed and encouraged, as a part of hypertension management. The importance of attaining a healthy weight is also discussed.     03/14/2023    8:10 AM 12/18/2022    8:04 AM 07/17/2022    9:29 AM 10/03/2021    8:53 AM 05/31/2021    2:12 PM 12/07/2020    2:27 PM 08/31/2020    8:00 AM  BP/Weight  Systolic BP 112 109 129 118 114 142 129  Diastolic BP 77 79 87 82 78 81 87  Wt. (Lbs) 146.08 139.04 142.08 135.04 141    BMI 27.6 kg/m2 26.27 kg/m2 26.85 kg/m2 25.52 kg/m2 27.54 kg/m2

## 2023-03-14 NOTE — Patient Instructions (Addendum)
Annual exam in early December call if you need me sooner.    You need to reduce sweets and fried and fatty foods your blood sugar is still above normal and your cholesterol is also high total and bad cholesterol.  Thankful that mental health is much improved continue medications as you are currently taking them.  Fasting lipid CMP and EGFR HbA1c TSH and vitamin D to be obtained 3 to 5 days before next appointment, patient has labs done at [facility] [employer] [facility] where she works.  Mammogram due December 22 or after please schedule at [facility] in [place]  at checkout  It is important that you exercise regularly at least 30 minutes 5 times a week. If you develop chest pain, have severe difficulty breathing, or feel very tired, stop exercising immediately and seek medical attention  Thanks for choosing [facility], we consider it a privelige to serve you.

## 2023-03-14 NOTE — Assessment & Plan Note (Signed)
Patient educated about the importance of limiting  Carbohydrate intake , the need to commit to daily physical activity for a minimum of 30 minutes , and to commit weight loss. The fact that changes in all these areas will reduce or eliminate all together the development of diabetes is stressed.      Latest Ref Rng & Units 07/23/2019   10:28 AM 06/05/2018    4:54 AM 05/30/2018    2:22 PM 05/09/2018   11:43 AM 12/01/2014    8:22 AM  Diabetic Labs  Chol 0 - 200 mg/dL     213   HDL >=08 mg/dL     73   Calc LDL 0 - 99 mg/dL     657   Triglycerides <150 mg/dL     89   Creatinine 8.46 - 1.00 mg/dL 9.62  9.52  8.41  3.24  0.74       03/14/2023    8:10 AM 12/18/2022    8:04 AM 07/17/2022    9:29 AM 10/03/2021    8:53 AM 05/31/2021    2:12 PM 12/07/2020    2:27 PM 08/31/2020    8:00 AM  BP/Weight  Systolic BP 112 109 129 118 114 142 129  Diastolic BP 77 79 87 82 78 81 87  Wt. (Lbs) 146.08 139.04 142.08 135.04 141    BMI 27.6 kg/m2 26.27 kg/m2 26.85 kg/m2 25.52 kg/m2 27.54 kg/m2         No data to display          improving

## 2023-03-14 NOTE — Assessment & Plan Note (Signed)
  Patient re-educated about  the importance of commitment to a  minimum of 150 minutes of exercise per week as able.  The importance of healthy food choices with portion control discussed, as well as eating regularly and within a 12 hour window most days. The need to choose "clean , green" food 50 to 75% of the time is discussed, as well as to make water the primary drink and set a goal of 64 ounces water daily.       03/14/2023    8:10 AM 12/18/2022    8:04 AM 07/17/2022    9:29 AM  Weight /BMI  Weight 146 lb 1.3 oz 139 lb 0.6 oz 142 lb 1.3 oz  Height 5\' 1"  (1.549 m) 5\' 1"  (1.549 m) 5\' 1"  (1.549 m)  BMI 27.6 kg/m2 26.27 kg/m2 26.85 kg/m2    Needs to work on weight loss

## 2023-03-18 ENCOUNTER — Telehealth: Payer: Self-pay | Admitting: Family Medicine

## 2023-03-18 DIAGNOSIS — M25522 Pain in left elbow: Secondary | ICD-10-CM

## 2023-03-18 NOTE — Telephone Encounter (Signed)
Patient calling wanting an order for an xray to be sent over to her job [employer] in [place], says she bumped her elbow and is in a lot of pain wants to make sure it's not fractured. Fax # [phone] Please advise, thank you

## 2023-03-18 NOTE — Telephone Encounter (Signed)
Left elbow, no she was not on the job, says she thinks she did it on this past Saturday 7/20.

## 2023-03-19 NOTE — Telephone Encounter (Signed)
Faxed order to number below

## 2023-03-19 NOTE — Telephone Encounter (Signed)
Patient calling says [facility] does not have the order, requesting to please fax it. Number is below

## 2023-06-27 ENCOUNTER — Telehealth: Payer: Self-pay

## 2023-06-27 NOTE — Telephone Encounter (Signed)
Questions about Duloxetine which was recently recalled. Patient reports that she is still taking the medication and would like to know the providers thought about the recall. Patient requested for a CMA to call back.

## 2023-07-02 ENCOUNTER — Other Ambulatory Visit: Payer: Self-pay | Admitting: Family Medicine

## 2023-07-28 ENCOUNTER — Other Ambulatory Visit: Payer: Self-pay | Admitting: Family Medicine

## 2023-07-31 ENCOUNTER — Encounter: Payer: Self-pay | Admitting: Family Medicine

## 2023-07-31 ENCOUNTER — Ambulatory Visit (INDEPENDENT_AMBULATORY_CARE_PROVIDER_SITE_OTHER): Payer: BC Managed Care – PPO | Admitting: Family Medicine

## 2023-07-31 VITALS — BP 121/84 | HR 83 | Ht 61.0 in | Wt 150.0 lb

## 2023-07-31 DIAGNOSIS — F324 Major depressive disorder, single episode, in partial remission: Secondary | ICD-10-CM

## 2023-07-31 DIAGNOSIS — Z0001 Encounter for general adult medical examination with abnormal findings: Secondary | ICD-10-CM | POA: Diagnosis not present

## 2023-07-31 DIAGNOSIS — E663 Overweight: Secondary | ICD-10-CM | POA: Diagnosis not present

## 2023-07-31 DIAGNOSIS — F411 Generalized anxiety disorder: Secondary | ICD-10-CM

## 2023-07-31 MED ORDER — ROSUVASTATIN CALCIUM 20 MG PO TABS: 20.0000 mg | ORAL_TABLET | Freq: Every day | ORAL | 5 refills | Status: DC

## 2023-07-31 MED ORDER — SEMAGLUTIDE-WEIGHT MANAGEMENT 0.25 MG/0.5ML ~~LOC~~ SOAJ: 0.2500 mg | SUBCUTANEOUS | 0 refills | Status: DC

## 2023-07-31 MED ORDER — FLUOXETINE HCL 20 MG PO TABS: 20.0000 mg | ORAL_TABLET | Freq: Every day | ORAL | 3 refills | Status: DC

## 2023-07-31 NOTE — Patient Instructions (Signed)
Follow-up in 7 to 8 weeks, call if you need me sooner.  New medication for cholesterol is rosuvastatin 20 mg daily.  You also need to change food choices and eat primarily produce with beans and white meats grilled or baked.  New higher dose of fluoxetine is 20 mg daily.  New medication to help with weight loss is semaglutide 2.5 mg weekly.  Please notify me if this is not covered so that the alternate medication phentermine can be prescribed.  Please commit as we discussed the regular physical activity this is good for your physical and mental health.  Best for 2025.  Thanks for choosing [facility], we consider it a privelige to serve you.

## 2023-07-31 NOTE — Progress Notes (Signed)
    [name] [name]     MRN: [id number]      DOB: 07/12/1971  Chief Complaint  Patient presents with   Annual Exam    CPE, recall on medication    HPI: Patient is in for annual physical exam. Wants help with weight loss medication C/o increased depression and anxiety, not suicidal or homicidal Hyperlipidemia, not at goal medication change made Immunization is reviewed , and  updated if needed.   PE: BP 121/84 (BP Location: Right Arm, Patient Position: Sitting, Cuff Size: Normal)   Pulse 83   Ht 5\' 1"  (1.549 m)   Wt 150 lb 0.6 oz (68.1 kg)   LMP 04/20/2018   SpO2 94%   BMI 28.35 kg/m   Pleasant  female, alert and oriented x 3, in no cardio-pulmonary distress. Afebrile. HEENT No facial trauma or asymetry. Sinuses non tender.  Extra occullar muscles intact.. External ears normal, . Neck: supple, no adenopathy,JVD or thyromegaly.No bruits.  Chest: Clear to ascultation bilaterally.No crackles or wheezes. Non tender to palpation  Breast: Not examined  Cardiovascular system; Heart sounds normal,  S1 and  S2 ,no S3.  No murmur, or thrill. Apical beat not displaced Peripheral pulses normal.  Abdomen: Soft, non tender, no organomegaly or masses. No bruits. Bowel sounds normal. No guarding, tenderness or rebound.    Musculoskeletal exam: Full ROM of spine, hips , shoulders and knees. No deformity ,swelling or crepitus noted. No muscle wasting or atrophy.   Neurologic: Cranial nerves 2 to 12 intact. Power, tone ,sensation and reflexes normal throughout. No disturbance in gait. No tremor.  Skin: Intact, no ulceration, erythema , scaling or rash noted. Pigmentation normal throughout  Psych; Depressed and anxious  Judgement and concentration normal   Assessment & Plan:  Annual visit for general adult medical examination with abnormal findings Annual exam as documented. Counseling done  re healthy lifestyle involving commitment to 150 minutes exercise per  week, heart healthy diet, and attaining healthy weight.The importance of adequate sleep also discussed.  Changes in health habits are decided on by the patient with goals and time frames  set for achieving them. Immunization and cancer screening needs are specifically addressed at this visit.   Depression, major, in partial remission (HCC) Increase dose fluoxetine to 20 mg daily and re evl in 6 to 8 weeks  Overweight (BMI 25.0-29.9)  Patient re-educated about  the importance of commitment to a  minimum of 150 minutes of exercise per week as able.  The importance of healthy food choices with portion control discussed, as well as eating regularly and within a 12 hour window most days. The need to choose "clean , green" food 50 to 75% of the time is discussed, as well as to make water the primary drink and set a goal of 64 ounces water daily.       07/31/2023    8:10 AM 03/14/2023    8:10 AM 12/18/2022    8:04 AM  Weight /BMI  Weight 150 lb 0.6 oz 146 lb 1.3 oz 139 lb 0.6 oz  Height 5\' 1"  (1.549 m) 5\' 1"  (1.549 m) 5\' 1"  (1.549 m)  BMI 28.35 kg/m2 27.6 kg/m2 26.27 kg/m2    Start weekly semaglutide if coveed, if not then phentermine low dose will be prescribed  Generalized anxiety disorder Uncontrolled and not at goal increase dose fluoxetine to 20 mg and re eval in 6 to 8 weeks

## 2023-08-05 ENCOUNTER — Encounter: Payer: Self-pay | Admitting: Family Medicine

## 2023-08-05 NOTE — Assessment & Plan Note (Signed)
Uncontrolled and not at goal increase dose fluoxetine to 20 mg and re eval in 6 to 8 weeks

## 2023-08-05 NOTE — Assessment & Plan Note (Signed)
Increase dose fluoxetine to 20 mg daily and re evl in 6 to 8 weeks

## 2023-08-05 NOTE — Assessment & Plan Note (Signed)
Annual exam as documented. Counseling done  re healthy lifestyle involving commitment to 150 minutes exercise per week, heart healthy diet, and attaining healthy weight.The importance of adequate sleep also discussed. Changes in health habits are decided on by the patient with goals and time frames  set for achieving them. Immunization and cancer screening needs are specifically addressed at this visit. 

## 2023-08-05 NOTE — Assessment & Plan Note (Signed)
  Patient re-educated about  the importance of commitment to a  minimum of 150 minutes of exercise per week as able.  The importance of healthy food choices with portion control discussed, as well as eating regularly and within a 12 hour window most days. The need to choose "clean , green" food 50 to 75% of the time is discussed, as well as to make water the primary drink and set a goal of 64 ounces water daily.       07/31/2023    8:10 AM 03/14/2023    8:10 AM 12/18/2022    8:04 AM  Weight /BMI  Weight 150 lb 0.6 oz 146 lb 1.3 oz 139 lb 0.6 oz  Height 5\' 1"  (1.549 m) 5\' 1"  (1.549 m) 5\' 1"  (1.549 m)  BMI 28.35 kg/m2 27.6 kg/m2 26.27 kg/m2    Start weekly semaglutide if coveed, if not then phentermine low dose will be prescribed

## 2023-08-06 ENCOUNTER — Other Ambulatory Visit: Payer: Self-pay

## 2023-08-06 MED ORDER — SEMAGLUTIDE-WEIGHT MANAGEMENT 0.25 MG/0.5ML ~~LOC~~ SOAJ: 0.2500 mg | SUBCUTANEOUS | 0 refills | Status: DC

## 2023-08-07 ENCOUNTER — Telehealth: Payer: Self-pay | Admitting: Family Medicine

## 2023-08-07 NOTE — Telephone Encounter (Signed)
[facility] PA submitted 08/07/23. Waiting on decision

## 2023-08-19 ENCOUNTER — Telehealth: Payer: Self-pay | Admitting: Family Medicine

## 2023-08-19 LAB — HM MAMMOGRAPHY

## 2023-08-19 NOTE — Telephone Encounter (Signed)
FYI- I called the number provided but it rings a few times and then a messages comes on that call cannot be completed at this time and hangs up. They should have all the information we have because the chart notes were uploaded with the PA so we would have nothing else to provide them.

## 2023-08-19 NOTE — Telephone Encounter (Signed)
Copied from CRM [id number]. Topic: Clinical - Prescription Issue >> Aug 19, 2023  2:26 PM Elle L wrote: Reason for CRM:  The patient is requesting that Abner Greenspan, LPN  call [phone]. She states that they denied the [facility] as they need more information.

## 2023-08-20 ENCOUNTER — Telehealth: Payer: Self-pay | Admitting: Family Medicine

## 2023-08-20 NOTE — Telephone Encounter (Signed)
Copied from CRM [id number]. Topic: Clinical - Prescription Issue >> Aug 20, 2023 11:57 AM Bobbye Morton wrote: Reason for CRM: The patient is requesting that Abner Greenspan, LPN call [phone]. She states that they denied the [facility] as they need more information.

## 2023-08-22 ENCOUNTER — Telehealth: Payer: Self-pay

## 2023-08-22 MED ORDER — TIRZEPATIDE-WEIGHT MANAGEMENT 2.5 MG/0.5ML ~~LOC~~ SOLN: 2.5000 mg | SUBCUTANEOUS | 0 refills | Status: DC

## 2023-08-22 NOTE — Telephone Encounter (Signed)
Copied from CRM [id number]. Topic: Clinical - Prescription Issue >> Aug 22, 2023  2:46 PM Antony Haste wrote: Reason for CRM: PT states she called earlier and left a message for Ms. Brandy pertaining to her prescription, she is requesting for Dr. Lodema Hong to call her back as PCP  is aware of "other" recommenced medication that PT  could be prescribed instead, PT states she does not remember the name of the medication. Callback #: [phone]   Please advise as Reginal Lutes was denied

## 2023-08-23 NOTE — Telephone Encounter (Signed)
Patient aware.

## 2023-08-30 ENCOUNTER — Other Ambulatory Visit: Payer: Self-pay

## 2023-08-30 ENCOUNTER — Telehealth: Payer: Self-pay

## 2023-08-30 MED ORDER — TIRZEPATIDE-WEIGHT MANAGEMENT 2.5 MG/0.5ML ~~LOC~~ SOLN: 2.5000 mg | SUBCUTANEOUS | 0 refills | Status: DC

## 2023-08-30 NOTE — Telephone Encounter (Signed)
 Copied from CRM [id number]. Topic: Clinical - Prescription Issue >> Aug 29, 2023  1:26 PM Elle L wrote: Reason for CRM: The patient states that the pharmacy has the tirzepatide  (ZEPBOUND ) 2.5 MG/0.5ML injection vial prescription but does not have prior authorization. [facility] [phone] - [place], [place] - [address] AT [facility] [address]     FYI   Noted will do PA on Cover my meds .

## 2023-08-30 NOTE — Telephone Encounter (Signed)
 Pa submitted on covermymeds

## 2023-09-03 ENCOUNTER — Telehealth: Payer: Self-pay | Admitting: Family Medicine

## 2023-09-03 NOTE — Telephone Encounter (Signed)
 Copied from CRM [id number]. Topic: Clinical - Prescription Issue >> Sep 03, 2023  9:46 AM Montie POUR wrote: Reason for CRM: [name] [name] needs [name] [name] to call her back as soon as possible about medication   tirzepatide  (ZEPBOUND ) 2.5 MG/0.5ML injection vial ; [employer] needs prior authorization so [employer] [phone] - , [place] will fill prescription.   Her Call back # [phone]

## 2023-09-03 NOTE — Telephone Encounter (Signed)
 Pa approved

## 2023-09-06 ENCOUNTER — Encounter: Payer: Self-pay | Admitting: Family Medicine

## 2023-09-09 ENCOUNTER — Other Ambulatory Visit: Payer: Self-pay

## 2023-09-09 MED ORDER — SEMAGLUTIDE-WEIGHT MANAGEMENT 0.25 MG/0.5ML ~~LOC~~ SOAJ: 0.2500 mg | SUBCUTANEOUS | 0 refills | Status: DC

## 2023-09-09 NOTE — Telephone Encounter (Signed)
 Copied from CRM [id number]. Topic: Clinical - Prescription Issue >> Sep 06, 2023 12:21 PM Farrel B wrote: Reason for CRM: Patient called in regards to her medication Wegovy  patient stated that she has been approved for the medication and needed the provider or clinical staff to resend the prescription. Patient advises she was not happy of the necessary protocol I had to follow in regards to sending direct messages to the provider; however she stated that she will reach out to Ms. Brandy to get this take care of today.  Patient's contact number is [phone].  I offered her an apology and told her we had to follow protocols that were were given.

## 2023-09-11 ENCOUNTER — Telehealth: Payer: Self-pay

## 2023-09-11 NOTE — Telephone Encounter (Signed)
 Copied from CRM [id number]. Topic: Clinical - Prescription Issue >> Sep 11, 2023 11:22 AM Elle L wrote: Reason for CRM: The patient states that her tirzepatide (ZEPBOUND) 2.5 MG/0.5ML injection vial was approved but the cost is $325. She is requesting a cheaper medication. Her call back number is [phone].

## 2023-09-13 MED ORDER — CLONAZEPAM 2 MG PO TABS: ORAL_TABLET | ORAL | 5 refills | Status: DC

## 2023-09-13 MED ORDER — PHENTERMINE HCL 15 MG PO CAPS: 15.0000 mg | ORAL_CAPSULE | ORAL | 1 refills | Status: DC

## 2023-09-13 NOTE — Telephone Encounter (Signed)
Phentermine has been prescribed

## 2023-09-13 NOTE — Addendum Note (Signed)
Addended by: Syliva Overman E on: 09/13/2023 12:16 PM   Modules accepted: Orders

## 2023-09-19 ENCOUNTER — Other Ambulatory Visit: Payer: Self-pay | Admitting: Family Medicine

## 2023-09-20 ENCOUNTER — Telehealth: Payer: Self-pay

## 2023-09-20 ENCOUNTER — Other Ambulatory Visit: Payer: Self-pay

## 2023-09-20 MED ORDER — SEMAGLUTIDE-WEIGHT MANAGEMENT 0.25 MG/0.5ML ~~LOC~~ SOAJ: 0.2500 mg | SUBCUTANEOUS | 0 refills | Status: AC

## 2023-09-20 NOTE — Telephone Encounter (Signed)
Copied from CRM [id number]. Topic: Clinical - Prescription Issue >> Sep 20, 2023  9:58 AM Desma Mcgregor wrote: Reason for CRM: Patient calling about WEGOVY. She sent a message with an attachment yesterday. Please review. Patient stated the doctor has to send back the defective package and ok the refill.

## 2023-09-26 ENCOUNTER — Ambulatory Visit: Payer: BC Managed Care – PPO | Admitting: Family Medicine

## 2023-10-08 ENCOUNTER — Other Ambulatory Visit: Payer: Self-pay | Admitting: Family Medicine

## 2023-10-14 ENCOUNTER — Telehealth (INDEPENDENT_AMBULATORY_CARE_PROVIDER_SITE_OTHER): Payer: BC Managed Care – PPO | Admitting: Family Medicine

## 2023-10-14 ENCOUNTER — Encounter: Payer: Self-pay | Admitting: Family Medicine

## 2023-10-14 ENCOUNTER — Ambulatory Visit: Payer: Self-pay | Admitting: Family Medicine

## 2023-10-14 DIAGNOSIS — J0100 Acute maxillary sinusitis, unspecified: Secondary | ICD-10-CM

## 2023-10-14 MED ORDER — AZITHROMYCIN 250 MG PO TABS: ORAL_TABLET | ORAL | 0 refills | Status: AC

## 2023-10-14 NOTE — Progress Notes (Unsigned)
Virtual Visit via Video Note  I connected with [name] [name] on 10/17/23 at  3:20 PM EST by a video enabled telemedicine application and verified that I am speaking with the correct person using two identifiers.  Patient Location: Home Provider Location: Office/Clinic  I discussed the limitations, risks, security, and privacy concerns of performing an evaluation and management service by video and the availability of in person appointments. I also discussed with the patient that there may be a patient responsible charge related to this service. The patient expressed understanding and agreed to proceed.  Subjective: PCP: Kerri Perches, MD  Chief Complaint  Patient presents with   Sinusitis    Sinus pressure/pain earache and teeth pain    HPI  The patient presents today with complaints of nasal congestion, sneezing, runny nose, cough, facial pain, and pressure, earache which have been ongoing intermittently for about 5 days.. She denies nausea, vomiting, diarrhea, fever, chills, sore throat, or body aches. She has been treating her symptoms with an over-the-counter regimen of Mucinex but reports minimal relief.   ROS: Per HPI  Current Outpatient Medications:    amLODipine (NORVASC) 5 MG tablet, TAKE 1 TABLET(5 MG) BY MOUTH DAILY, Disp: 90 tablet, Rfl: 1   aspirin-acetaminophen-caffeine (EXCEDRIN MIGRAINE) 250-250-65 MG tablet, Take 1 tablet by mouth daily as needed for headache., Disp: , Rfl:    azithromycin (ZITHROMAX) 250 MG tablet, Take 2 tablets on day 1, then 1 tablet daily on days 2 through 5, Disp: 6 tablet, Rfl: 0   busPIRone (BUSPAR) 10 MG tablet, Take 1 tablet (10 mg total) by mouth 3 (three) times daily., Disp: 90 tablet, Rfl: 5   clonazePAM (KLONOPIN) 2 MG tablet, Take half tablet by mouth at bedtime for anxiety and sleep, Disp: 15 tablet, Rfl: 5   clonazePAM (KLONOPIN) 2 MG tablet, Take half tablet at bedtime for anxiety and sleep, Disp: 15 tablet, Rfl: 5    DULoxetine (CYMBALTA) 60 MG capsule, TAKE 1 CAPSULE(60 MG) BY MOUTH DAILY, Disp: 30 capsule, Rfl: 5   ELDERBERRY PO, Take 410 mg by mouth daily. Daily- zinc vitamin C combo, Disp: , Rfl:    FLUoxetine (PROZAC) 20 MG tablet, Take 1 tablet (20 mg total) by mouth daily., Disp: 30 tablet, Rfl: 3   linaclotide (LINZESS) 72 MCG capsule, Take 1 capsule (72 mcg total) by mouth daily before breakfast., Disp: 30 capsule, Rfl: 5   montelukast (SINGULAIR) 10 MG tablet, TAKE 1 TABLET(10 MG) BY MOUTH AT BEDTIME, Disp: 30 tablet, Rfl: 5   Multiple Vitamins-Calcium (ONE-A-DAY WOMENS FORMULA PO), Take 1 capsule by mouth daily., Disp: , Rfl:    omeprazole (PRILOSEC) 40 MG capsule, Take 1 capsule (40 mg total) by mouth daily., Disp: 30 capsule, Rfl: 5   phentermine 15 MG capsule, Take 1 capsule (15 mg total) by mouth every morning., Disp: 30 capsule, Rfl: 1   rosuvastatin (CRESTOR) 20 MG tablet, Take 1 tablet (20 mg total) by mouth daily., Disp: 30 tablet, Rfl: 5   Semaglutide-Weight Management 0.25 MG/0.5ML SOAJ, Inject 0.25 mg into the skin once a week for 28 days., Disp: 2 mL, Rfl: 0   amLODipine (NORVASC) 2.5 MG tablet, Take 1 tablet (2.5 mg total) by mouth daily., Disp: 30 tablet, Rfl: 5  Observations/Objective: There were no vitals filed for this visit. Physical Exam Patient is well-developed Head is normocephalic, atraumatic.  No labored breathing.  Speech is clear and coherent with logical content.  Patient is alert and oriented at baseline.  Assessment and Plan: Subacute maxillary sinusitis -     Azithromycin; Take 2 tablets on day 1, then 1 tablet daily on days 2 through 5  Dispense: 6 tablet; Refill: 0  Increase fluid intake and allow for plenty of rest. Take Tylenol as needed for pain, fever, or general discomfort. Perform warm saltwater gargles 3-4 times daily to help with throat pain or discomfort. (Mix 1/2 teaspoon of salt in a glass of warm water and gargle several times daily to reduce  throat inflammation and soothe irritation.) Look for sugar-free or honey-based throat lozenges to help with a sore throat. Use a humidifier at bedtime to help with cough and nasal congestion. For nasal congestion: The use of heated humidified air is a safe and effective therapy. Saline nasal sprays may also help alleviate nasal symptoms of the common cold.   Follow Up Instructions: No follow-ups on file.   I discussed the assessment and treatment plan with the patient. The patient was provided an opportunity to ask questions, and all were answered. The patient agreed with the plan and demonstrated an understanding of the instructions.   The patient was advised to call back or seek an in-person evaluation if the symptoms worsen or if the condition fails to improve as anticipated.  The above assessment and management plan was discussed with the patient. The patient verbalized understanding of and has agreed to the management plan.   Gilmore Laroche, FNP

## 2023-10-14 NOTE — Telephone Encounter (Signed)
   Chief Complaint: sinus pain  Symptoms: earache, teeth pain, head pressure  Frequency: constant   Disposition: [] ED /[] Urgent Care (no appt availability in office) / [x] Appointment(In office/virtual)/ []   Virtual Care/ [] Home Care/ [] Refused Recommended Disposition /[]  Mobile Bus/ []  Follow-up with PCP Additional Notes: Pt calling with complaints of sinus pressure pain of right side of face with earache and teeth pain.  Pt stated the pain is 8/10. Pt stated the pain started Saturday. Pt has been using neti pot and it has helped. Pt denies any SOB or fever. Per protocol, pt to be seen within 24 hours. Pt is  at work and is requesting virtual appt. Pt has one today at 1530. RN informed pt that an email will sent for appt. RN gave care advice and pt verbalized understanding.           Copied from CRM [id number]. Topic: Clinical - Medical Advice >> Oct 14, 2023 10:13 AM Desma Mcgregor wrote: Reason for CRM: Pt asking if something can be sent to her [facility] pharmacy for a possible sinus infection. Her ears are aching really bad and she is not feeling well. Pt asking to please f/u if something will be sent Reason for Disposition  [1] SEVERE pain AND [2] not improved 2 hours after pain medicine  Answer Assessment - Initial Assessment Questions 1. LOCATION: "Where does it hurt?"      Right side of face, ear, jaw 2. ONSET: "When did the sinus pain start?"  (e.g., hours, days)      Saturday  3. SEVERITY: "How bad is the pain?"   (Scale 1-10; mild, moderate or severe)   - MILD (1-3): doesn't interfere with normal activities    - MODERATE (4-7): interferes with normal activities (e.g., work or school) or awakens from sleep   - SEVERE (8-10): excruciating pain and patient unable to do any normal activities        8 4. RECURRENT SYMPTOM: "Have you ever had sinus problems before?" If Yes, ask: "When was the last time?" and "What happened that time?"      "Always"- months ago  5.  NASAL CONGESTION: "Is the nose blocked?" If Yes, ask: "Can you open it or must you breathe through your mouth?"     no 6. NASAL DISCHARGE: "Do you have discharge from your nose?" If so ask, "What color?"     Denies  7. FEVER: "Do you have a fever?" If Yes, ask: "What is it, how was it measured, and when did it start?"      Denies  8. OTHER SYMPTOMS: "Do you have any other symptoms?" (e.g., sore throat, cough, earache, difficulty breathing)     earache  Protocols used: Sinus Pain or Congestion-A-AH

## 2023-10-15 ENCOUNTER — Encounter: Payer: Self-pay | Admitting: Family Medicine

## 2023-10-16 ENCOUNTER — Other Ambulatory Visit: Payer: Self-pay

## 2023-10-16 MED ORDER — AMLODIPINE BESYLATE 2.5 MG PO TABS: 2.5000 mg | ORAL_TABLET | Freq: Every day | ORAL | 5 refills | Status: DC

## 2023-11-06 ENCOUNTER — Telehealth: Payer: Self-pay | Admitting: Pharmacy Technician

## 2023-11-06 ENCOUNTER — Telehealth: Payer: Self-pay | Admitting: Family Medicine

## 2023-11-06 ENCOUNTER — Ambulatory Visit: Payer: BC Managed Care – PPO | Admitting: Family Medicine

## 2023-11-06 ENCOUNTER — Encounter: Payer: Self-pay | Admitting: Family Medicine

## 2023-11-06 ENCOUNTER — Other Ambulatory Visit (HOSPITAL_COMMUNITY): Payer: Self-pay

## 2023-11-06 VITALS — BP 114/78 | HR 96 | Resp 16 | Ht 60.0 in | Wt 124.0 lb

## 2023-11-06 DIAGNOSIS — E785 Hyperlipidemia, unspecified: Secondary | ICD-10-CM | POA: Diagnosis not present

## 2023-11-06 DIAGNOSIS — E663 Overweight: Secondary | ICD-10-CM | POA: Diagnosis not present

## 2023-11-06 DIAGNOSIS — I10 Essential (primary) hypertension: Secondary | ICD-10-CM | POA: Diagnosis not present

## 2023-11-06 DIAGNOSIS — J309 Allergic rhinitis, unspecified: Secondary | ICD-10-CM

## 2023-11-06 DIAGNOSIS — R7302 Impaired glucose tolerance (oral): Secondary | ICD-10-CM | POA: Diagnosis not present

## 2023-11-06 DIAGNOSIS — M79675 Pain in left toe(s): Secondary | ICD-10-CM

## 2023-11-06 MED ORDER — SEMAGLUTIDE-WEIGHT MANAGEMENT 0.25 MG/0.5ML ~~LOC~~ SOAJ: 0.2500 mg | SUBCUTANEOUS | 0 refills | Status: DC

## 2023-11-06 MED ORDER — CHLORPHENIRAMINE MALEATE 4 MG PO TABS
ORAL_TABLET | ORAL | 1 refills | Status: AC
Start: 2023-11-06 — End: ?

## 2023-11-06 MED ORDER — PHENTERMINE HCL 15 MG PO CAPS: 15.0000 mg | ORAL_CAPSULE | ORAL | 2 refills | Status: DC

## 2023-11-06 MED ORDER — LORATADINE 10 MG PO TABS: ORAL_TABLET | ORAL | 1 refills | Status: DC

## 2023-11-06 NOTE — Telephone Encounter (Signed)
 Copied from CRM [id number]. Topic: General - Other >> Nov 06, 2023  2:05 PM Eunice Blase wrote: Reason for CRM: Pt faxing prior authorization for [facility]. Please contact pt when received at  [phone].

## 2023-11-06 NOTE — Assessment & Plan Note (Addendum)
 Hyperlipidemia:Low fat diet discussed and encouraged. Updated lab needed at/ before next visit.    Lipid Panel  Lab Results  Component Value Date   CHOL 250 (H) 12/01/2014   HDL 73 12/01/2014   LDLCALC 159 (H) 12/01/2014   TRIG 89 12/01/2014   CHOLHDL 3.4 12/01/2014

## 2023-11-06 NOTE — Patient Instructions (Addendum)
 F/U in 4 months, call if you need me sooner  Fasting lipid, cmp and EGFr , HBA1c  3 to 5  days before next appointment  You need to take allergy medicine every day loratadine 1 a day or 1 twice daily to control runny nose.  Decongestant chlorpheniramine is also prescribed for excessive runny nose.  Both of the medications for allergies are at [facility].  Congratulations on excellent weight loss.  Please continue healthy food choices and eat on a regular schedule.  Weight loss medicine is sent to [facility].  Please start an exercise commitment of 30 minutes 5 days/week for health.  Thanks for choosing [facility], we consider it a privelige to serve you.

## 2023-11-06 NOTE — Assessment & Plan Note (Addendum)
  Patient re-educated about  the importance of commitment to a  minimum of 150 minutes of exercise per week as able.  The importance of healthy food choices with portion control discussed, as well as eating regularly and within a 12 hour window most days. The need to choose clean , green food 50 to 75% of the time is discussed, as well as to make water the primary drink and set a goal of 64 ounces water daily. wILL BE MAINTAINED ON ONLY One WEIGHT LOSS MEDICATION PER GUIDELINES/ RECOMMENDATIONS, THIS WILL BE WEGOVY       11/06/2023    8:11 AM 07/31/2023    8:10 AM 03/14/2023    8:10 AM  Weight /BMI  Weight 124 lb 150 lb 0.6 oz 146 lb 1.3 oz  Height 5' (1.524 m) 5' 1 (1.549 m) 5' 1 (1.549 m)  BMI 24.22 kg/m2 28.35 kg/m2 27.6 kg/m2

## 2023-11-06 NOTE — Telephone Encounter (Signed)
 Pharmacy Patient Advocate Encounter   Received notification from Pt Calls Messages that prior authorization for [facility] 0.25MG /0.5ML PEN is required/requested.   Insurance verification completed.   The patient is insured through  [employer]  .   Per test claim: The current 28 day co-pay is, $325.00.  No PA needed at this time. This test claim was processed through [facility]- copay amounts may vary at other pharmacies due to pharmacy/plan contracts, or as the patient moves through the different stages of their insurance plan.    Patient can try to access a copay savings card via the manufacturer website to try and reduce her out of pocket cost.

## 2023-11-06 NOTE — Assessment & Plan Note (Signed)
 Patient educated about the importance of limiting  Carbohydrate intake , the need to commit to daily physical activity for a minimum of 30 minutes , and to commit weight loss. The fact that changes in all these areas will reduce or eliminate all together the development of diabetes is stressed.      Latest Ref Rng & Units 07/23/2019   10:28 AM 06/05/2018    4:54 AM 05/30/2018    2:22 PM 05/09/2018   11:43 AM 12/01/2014    8:22 AM  Diabetic Labs  Chol 0 - 200 mg/dL     161   HDL >=09 mg/dL     73   Calc LDL 0 - 99 mg/dL     604   Triglycerides <150 mg/dL     89   Creatinine 5.40 - 1.00 mg/dL 9.81  1.91  4.78  2.95  0.74       11/06/2023    8:11 AM 07/31/2023    8:10 AM 03/14/2023    8:10 AM 12/18/2022    8:04 AM 07/17/2022    9:29 AM 10/03/2021    8:53 AM 05/31/2021    2:12 PM  BP/Weight  Systolic BP 114 121 112 109 129 118 114  Diastolic BP 78 84 77 79 87 82 78  Wt. (Lbs) 124 150.04 146.08 139.04 142.08 135.04 141  BMI 24.22 kg/m2 28.35 kg/m2 27.6 kg/m2 26.27 kg/m2 26.85 kg/m2 25.52 kg/m2 27.54 kg/m2       No data to display

## 2023-11-06 NOTE — Assessment & Plan Note (Signed)
 Nail split , wants podiatry consult

## 2023-11-07 NOTE — Telephone Encounter (Signed)
 Message relayed to pt

## 2023-11-11 ENCOUNTER — Other Ambulatory Visit: Payer: Self-pay | Admitting: Family Medicine

## 2023-11-25 NOTE — Progress Notes (Addendum)
 [name] [name]     MRN: [id number]      DOB: [birth year]  Chief Complaint  Patient presents with   Weight Loss    Follow up visit     HPI [name] [name] is here for follow up and re-evaluation of chronic medical conditions, medication management and review of any available recent lab and radiology data.  Preventive health is updated, specifically  Cancer screening and Immunization.   Questions or concerns regarding consultations or procedures which the PT has had in the interim are  addressed. The PT denies any adverse reactions to current medications since the last visit. And is very pleased with weight loss achieved and plans to continue in this direction C/o uncontrolled allergy symptoms but is not consistently taking medication for allergiesNo regular exercise commitment yet   ROS Denies recent fever or chills. C/o watery itchy eyes, sneezing excessively ,  sinus pressure, nasal congestion, denies ear pain or sore throat. Denies chest congestion, productive cough or wheezing. Denies chest pains, palpitations and leg swelling Denies abdominal pain, nausea, vomiting,diarrhea or constipation.   Denies dysuria, frequency, hesitancy or incontinence. Denies joint pain, swelling and limitation in mobility. Denies headaches, seizures, numbness, or tingling. Denies depression, anxiety or insomnia. Denies skin break down or rash.   PE  BP 114/78   Pulse 96   Resp 16   Ht 5' (1.524 m)   Wt 124 lb (56.2 kg)   LMP 04/20/2018   SpO2 96%   BMI 24.22 kg/m   Patient alert and oriented and in no cardiopulmonary distress.  HEENT: No facial asymmetry, EOMI,     Neck supple .Nasal congestion no sinus tenderness or cervical adenitis  Chest: Clear to auscultation bilaterally.  CVS: S1, S2 no murmurs, no S3.Regular rate.  ABD: Soft non tender.   Ext: No edema  MS: Adequate ROM spine, shoulders, hips and knees.  Skin: Intact, no ulcerations or rash noted.  Psych: Good eye  contact, normal affect. Memory intact not anxious or depressed appearing.  CNS: CN 2-12 intact, power,  normal throughout.no focal deficits noted.   Assessment & Plan  IGT (impaired glucose tolerance) Patient educated about the importance of limiting  Carbohydrate intake , the need to commit to daily physical activity for a minimum of 30 minutes , and to commit weight loss. The fact that changes in all these areas will reduce or eliminate all together the development of diabetes is stressed.      Latest Ref Rng & Units 07/23/2019   10:28 AM 06/05/2018    4:54 AM 05/30/2018    2:22 PM 05/09/2018   11:43 AM 12/01/2014    8:22 AM  Diabetic Labs  Chol 0 - 200 mg/dL     782   HDL >=95 mg/dL     73   Calc LDL 0 - 99 mg/dL     621   Triglycerides <150 mg/dL     89   Creatinine 3.08 - 1.00 mg/dL 6.57  8.46  9.62  9.52  0.74       11/06/2023    8:11 AM 07/31/2023    8:10 AM 03/14/2023    8:10 AM 12/18/2022    8:04 AM 07/17/2022    9:29 AM 10/03/2021    8:53 AM 05/31/2021    2:12 PM  BP/Weight  Systolic BP 114 121 112 109 129 118 114  Diastolic BP 78 84 77 79 87 82 78  Wt. (Lbs) 124 150.04 146.08 139.04 142.08 135.04  141  BMI 24.22 kg/m2 28.35 kg/m2 27.6 kg/m2 26.27 kg/m2 26.85 kg/m2 25.52 kg/m2 27.54 kg/m2       No data to display            Hyperlipidemia LDL goal <100 Hyperlipidemia:Low fat diet discussed and encouraged. Updated lab needed at/ before next visit.    Lipid Panel  Lab Results  Component Value Date   CHOL 250 (H) 12/01/2014   HDL 73 12/01/2014   LDLCALC 159 (H) 12/01/2014   TRIG 89 12/01/2014   CHOLHDL 3.4 12/01/2014       Overweight (BMI 25.0-29.9)  Patient re-educated about  the importance of commitment to a  minimum of 150 minutes of exercise per week as able.  The importance of healthy food choices with portion control discussed, as well as eating regularly and within a 12 hour window most days. The need to choose clean , green food 50 to 75%  of the time is discussed, as well as to make water the primary drink and set a goal of 64 ounces water daily. wILL BE MAINTAINED ON ONLY One WEIGHT LOSS MEDICATION PER GUIDELINES/ RECOMMENDATIONS, THIS WILL BE WEGOVY       11/06/2023    8:11 AM 07/31/2023    8:10 AM 03/14/2023    8:10 AM  Weight /BMI  Weight 124 lb 150 lb 0.6 oz 146 lb 1.3 oz  Height 5' (1.524 m) 5' 1 (1.549 m) 5' 1 (1.549 m)  BMI 24.22 kg/m2 28.35 kg/m2 27.6 kg/m2      Great toe pain, left Nail split , wants podiatry consult  Allergic rhinitis Uncontrolled , needs to commit to daily medication due to recent flare of symptoms as expected during this time of the year  Benign essential HTN Controlled, no change in medication DASH diet and commitment to daily physical activity for a minimum of 30 minutes discussed and encouraged, as a part of hypertension management. The importance of attaining a healthy weight is also discussed.     11/06/2023    8:11 AM 07/31/2023    8:10 AM 03/14/2023    8:10 AM 12/18/2022    8:04 AM 07/17/2022    9:29 AM 10/03/2021    8:53 AM 05/31/2021    2:12 PM  BP/Weight  Systolic BP 114 121 112 109 129 118 114  Diastolic BP 78 84 77 79 87 82 78  Wt. (Lbs) 124 150.04 146.08 139.04 142.08 135.04 141  BMI 24.22 kg/m2 28.35 kg/m2 27.6 kg/m2 26.27 kg/m2 26.85 kg/m2 25.52 kg/m2 27.54 kg/m2

## 2023-11-25 NOTE — Assessment & Plan Note (Signed)
 Uncontrolled , needs to commit to daily medication due to recent flare of symptoms as expected during this time of the year

## 2023-11-25 NOTE — Assessment & Plan Note (Signed)
 Controlled, no change in medication DASH diet and commitment to daily physical activity for a minimum of 30 minutes discussed and encouraged, as a part of hypertension management. The importance of attaining a healthy weight is also discussed.     11/06/2023    8:11 AM 07/31/2023    8:10 AM 03/14/2023    8:10 AM 12/18/2022    8:04 AM 07/17/2022    9:29 AM 10/03/2021    8:53 AM 05/31/2021    2:12 PM  BP/Weight  Systolic BP 114 121 112 109 129 118 114  Diastolic BP 78 84 77 79 87 82 78  Wt. (Lbs) 124 150.04 146.08 139.04 142.08 135.04 141  BMI 24.22 kg/m2 28.35 kg/m2 27.6 kg/m2 26.27 kg/m2 26.85 kg/m2 25.52 kg/m2 27.54 kg/m2

## 2023-12-19 ENCOUNTER — Other Ambulatory Visit: Payer: Self-pay | Admitting: Family Medicine

## 2023-12-24 ENCOUNTER — Telehealth: Payer: Self-pay

## 2023-12-24 NOTE — Telephone Encounter (Signed)
 Pharmacy updated as preferred pharmacy in chart. All future medications will be sent here per patients request

## 2023-12-24 NOTE — Telephone Encounter (Signed)
 Copied from CRM [id number]. Topic: Clinical - Prescription Issue >> Dec 24, 2023 11:51 AM [name] [name] wrote: Reason for CRM: Patient wants all furture meds to go to, [facility]. [phone]. [address], [place]. States even the ones that are from [employer] and [facility].  Patient states she also has a discount card for Wegovy . Please respond to the patient if there are any issues.

## 2024-01-02 ENCOUNTER — Other Ambulatory Visit: Payer: Self-pay

## 2024-01-02 ENCOUNTER — Telehealth: Payer: Self-pay | Admitting: Family Medicine

## 2024-01-02 ENCOUNTER — Encounter: Payer: Self-pay | Admitting: Family Medicine

## 2024-01-02 ENCOUNTER — Other Ambulatory Visit: Payer: Self-pay | Admitting: Family Medicine

## 2024-01-02 MED ORDER — AMLODIPINE BESYLATE 5 MG PO TABS: 5.0000 mg | ORAL_TABLET | Freq: Every day | ORAL | 1 refills | Status: DC

## 2024-01-02 MED ORDER — DULOXETINE HCL 60 MG PO CPEP: ORAL_CAPSULE | ORAL | 5 refills | Status: AC

## 2024-01-02 MED ORDER — ROSUVASTATIN CALCIUM 20 MG PO TABS: 20.0000 mg | ORAL_TABLET | Freq: Every day | ORAL | 5 refills | Status: AC

## 2024-01-02 MED ORDER — OMEPRAZOLE 40 MG PO CPDR: 40.0000 mg | DELAYED_RELEASE_CAPSULE | Freq: Every day | ORAL | 5 refills | Status: AC

## 2024-01-02 MED ORDER — AMLODIPINE BESYLATE 2.5 MG PO TABS: 2.5000 mg | ORAL_TABLET | Freq: Every day | ORAL | 5 refills | Status: AC

## 2024-01-02 MED ORDER — FLUOXETINE HCL 20 MG PO TABS: 20.0000 mg | ORAL_TABLET | Freq: Every day | ORAL | 3 refills | Status: AC

## 2024-01-02 MED ORDER — LORATADINE 10 MG PO TABS: ORAL_TABLET | ORAL | 1 refills | Status: AC

## 2024-01-02 MED ORDER — BUSPIRONE HCL 10 MG PO TABS: 10.0000 mg | ORAL_TABLET | Freq: Three times a day (TID) | ORAL | 5 refills | Status: AC

## 2024-01-02 MED ORDER — MONTELUKAST SODIUM 10 MG PO TABS: 10.0000 mg | ORAL_TABLET | Freq: Every day | ORAL | 5 refills | Status: AC

## 2024-01-02 NOTE — Telephone Encounter (Signed)
 Copied from CRM [id number]. Topic: General - Other >> Jan 02, 2024  3:03 PM Marissa P wrote: Reason for CRM: Patient called to get an update on her Wegovy . Please follow up [phone]

## 2024-01-03 MED ORDER — CLONAZEPAM 2 MG PO TABS: ORAL_TABLET | ORAL | 5 refills | Status: DC

## 2024-01-03 MED ORDER — WEGOVY 0.25 MG/0.5ML ~~LOC~~ SOAJ: 0.2500 mg | SUBCUTANEOUS | 2 refills | Status: DC

## 2024-01-03 MED ORDER — PHENTERMINE HCL 15 MG PO CAPS: 15.0000 mg | ORAL_CAPSULE | ORAL | 1 refills | Status: DC

## 2024-01-08 ENCOUNTER — Telehealth: Payer: Self-pay | Admitting: Family Medicine

## 2024-01-08 ENCOUNTER — Other Ambulatory Visit: Payer: Self-pay | Admitting: Family Medicine

## 2024-01-08 NOTE — Telephone Encounter (Signed)
 Copied from CRM [id number]. Topic: Clinical - Prescription Issue >> Jan 08, 2024  3:16 PM Carlatta H wrote: Reason for CRM: Patient called to get a status update on prior authorization for wegovy //Please call back patient//

## 2024-01-09 ENCOUNTER — Other Ambulatory Visit (HOSPITAL_COMMUNITY): Payer: Self-pay

## 2024-01-09 ENCOUNTER — Telehealth: Payer: Self-pay | Admitting: Pharmacy Technician

## 2024-01-09 NOTE — Telephone Encounter (Signed)
 Pharmacy Patient Advocate Encounter   Received notification from Pt Calls Messages that prior authorization for Wegovy  0.25MG /0.5ML auto-injectors is required/requested.   Insurance verification completed.   The patient is insured through [facility] .   Per test claim: PA required; PA submitted to above mentioned insurance via CoverMyMeds Key/confirmation #/EOC [facility] Status is pending

## 2024-01-09 NOTE — Telephone Encounter (Signed)
 PA request has been Started. New Encounter has been or will be created for follow up. For additional info see Pharmacy Prior Auth telephone encounter from 01/09/2024.

## 2024-01-10 ENCOUNTER — Other Ambulatory Visit (HOSPITAL_COMMUNITY): Payer: Self-pay

## 2024-01-10 NOTE — Telephone Encounter (Signed)
 Pharmacy Patient Advocate Encounter  Received notification from [facility] Plan  that Prior Authorization for Wegovy  0.25MG /0.5ML auto-injectors has been DENIED.  Full denial letter will be uploaded to the media tab. See denial reason below.   PA #/Case ID/Reference #: Key: [id number]

## 2024-01-10 NOTE — Telephone Encounter (Signed)
 PA denied. Please see encounter from [facility] with Pharmacy Services in regard to appeal.

## 2024-01-10 NOTE — Telephone Encounter (Signed)
 Additional information form faxed to [facility] Plan this morning at 9:10am.

## 2024-01-13 ENCOUNTER — Other Ambulatory Visit: Payer: Self-pay | Admitting: Family Medicine

## 2024-01-13 MED ORDER — SEMAGLUTIDE-WEIGHT MANAGEMENT 0.25 MG/0.5ML ~~LOC~~ SOAJ: SUBCUTANEOUS | 0 refills | Status: DC

## 2024-01-13 NOTE — Progress Notes (Signed)
 4 ml for 180 days sent

## 2024-01-13 NOTE — Telephone Encounter (Signed)
 Script re sent with 4 ml for 180 days

## 2024-01-13 NOTE — Telephone Encounter (Signed)
 Pt informed via MyChart

## 2024-01-24 ENCOUNTER — Other Ambulatory Visit: Payer: Self-pay

## 2024-01-24 MED ORDER — SEMAGLUTIDE-WEIGHT MANAGEMENT 0.25 MG/0.5ML ~~LOC~~ SOAJ: SUBCUTANEOUS | 0 refills | Status: DC

## 2024-01-27 ENCOUNTER — Other Ambulatory Visit (HOSPITAL_COMMUNITY): Payer: Self-pay

## 2024-01-28 ENCOUNTER — Other Ambulatory Visit (HOSPITAL_COMMUNITY): Payer: Self-pay

## 2024-01-28 ENCOUNTER — Telehealth: Payer: Self-pay

## 2024-01-28 NOTE — Telephone Encounter (Signed)
 See the patient message I sent you as well. They are saying 0.5 mg now

## 2024-01-28 NOTE — Telephone Encounter (Signed)
 See other note

## 2024-01-28 NOTE — Telephone Encounter (Signed)
 Copied from CRM [id number]. Topic: Clinical - Medication Prior Auth >> Jan 28, 2024  9:08 AM [name] [name] wrote: Reason for CRM: The patient called with her insurance company on the line and the insurance agent states that she needs a prior authorization for her Wegovy  as they are requesting for it to be the next dosage up to be at therapeutic levels. The patient's call back number is [phone].

## 2024-01-28 NOTE — Telephone Encounter (Signed)
 Copied from CRM [id number]. Topic: Clinical - Prescription Issue >> Jan 28, 2024 10:28 AM Stanly Early wrote: Reason for CRM: I was looking at the notes about the Semaglutide -Weight Management 0.25 MG/0.5ML SOAJ, it states the its up to the provider to approve to up the dose for the patient. Once the provider approves the .5 dose please send a PA to [facility]  Fax:[phone]

## 2024-01-28 NOTE — Telephone Encounter (Signed)
 Please consult with Dr. Rodolph Clap as she did not prescribe the next dosage up as patient's current weight is within normal range. If Dr. Rodolph Clap wishes to prescribe the 5mg  I can submit a PA for the 5mg  dosage if she agrees.

## 2024-01-29 MED ORDER — SEMAGLUTIDE-WEIGHT MANAGEMENT 0.5 MG/0.5ML ~~LOC~~ SOAJ: 0.5000 mg | SUBCUTANEOUS | 1 refills | Status: DC

## 2024-01-29 NOTE — Telephone Encounter (Signed)
 Wegovy  0.5 mg dose is prescribed

## 2024-01-31 ENCOUNTER — Telehealth: Payer: Self-pay

## 2024-01-31 ENCOUNTER — Telehealth: Payer: Self-pay | Admitting: Pharmacy Technician

## 2024-01-31 ENCOUNTER — Other Ambulatory Visit (HOSPITAL_COMMUNITY): Payer: Self-pay

## 2024-01-31 NOTE — Telephone Encounter (Signed)
 PA request has been Started. New Encounter has been or will be created for follow up. For additional info see Pharmacy Prior Auth telephone encounter from 01/31/2024.

## 2024-01-31 NOTE — Telephone Encounter (Signed)
 Pharmacy Patient Advocate Encounter   Received notification from Pt Calls Messages that prior authorization for WEGOVY  0.5MG /0.5ML AUTO-INJECTORS is required/requested.   Insurance verification completed.   The patient is insured through [facility] [employer] .   Per test claim: PA required; PA submitted to above mentioned insurance via Fax Key/confirmation #/EOC   Status is pending

## 2024-01-31 NOTE — Telephone Encounter (Signed)
 Copied from CRM [id number]. Topic: Clinical - Medication Prior Auth >> Jan 31, 2024 10:44 AM Turkey B wrote: Reason for CRM: Dani from [facility] called in has questions to ask for PA for Wegovy  and Phentermine 

## 2024-02-04 ENCOUNTER — Other Ambulatory Visit (HOSPITAL_COMMUNITY): Payer: Self-pay

## 2024-02-05 ENCOUNTER — Telehealth: Payer: Self-pay | Admitting: Family Medicine

## 2024-02-05 NOTE — Telephone Encounter (Signed)
 Copied from CRM [id number]. Topic: Clinical - Prescription Issue >> Feb 05, 2024 10:26 AM Rosaria Common wrote: Reason for CRM: Dani from [facility] called in with questions in regards to pt's insurance only covering 1/2 medications either for Wegovy  or Phentermine  with [employer]. Callback number is [phone]

## 2024-02-06 ENCOUNTER — Other Ambulatory Visit (HOSPITAL_COMMUNITY): Payer: Self-pay

## 2024-02-06 NOTE — Telephone Encounter (Signed)
 Pharmacy Patient Advocate Encounter  Received notification from [facility] NETWORK that Prior Authorization for WEGOVY  0.5MG /0.5ML AUTO-INJECTORS  has been APPROVED from 01/28/2024 to 08/05/2024. Unable to obtain price due to refill too soon rejection, last fill date 02/04/2024 next available fill date07/08/2023

## 2024-02-11 ENCOUNTER — Other Ambulatory Visit: Payer: Self-pay | Admitting: Family Medicine

## 2024-02-11 NOTE — Telephone Encounter (Signed)
 NEEDS TO BE ON ONE MED FOR WEIGHT LOSS ONLY, PHENTERMINE  IS D/C AND WEGOVY  AS BEFORE, PLS LET PT KNOW AND FAX MOST RECENT ov TO dANI. I HAD HER NUMVBER IN ANOTHER NOTE IF UNABLE TO FIND PLS CALL DANI ON THE NUMBER IN RECORD TO GET THE FAX NUMBER , ALSO LET PT KNOW

## 2024-02-11 NOTE — Telephone Encounter (Signed)
 Done

## 2024-02-11 NOTE — Progress Notes (Signed)
 FAX TO dANI dANU [phone] PLS MOST RECENT CHART [place]. ONLY ONE WEIGHT LOSS MED PERMITTED GENERALLY AND PT IS IN HEALTHY WEIFGHT, WILL KEEP WEGOVY  AND D/C PHENTERMINE  I SPOKE WITH PAHRAMCIST , DANU , SHE NEEDS LASYT OFFICE NOTE SENT ALSO , I WILL LIKELY NEED TO ADDEND THE NOTE TO REFLECT THIS MX, PLS ALSO CONTACT PT AND LET HER KNOW

## 2024-03-11 ENCOUNTER — Ambulatory Visit: Admitting: Family Medicine

## 2024-03-11 ENCOUNTER — Encounter: Payer: Self-pay | Admitting: Family Medicine

## 2024-03-11 VITALS — BP 118/83 | HR 96 | Resp 18 | Ht 61.0 in | Wt 122.0 lb

## 2024-03-11 DIAGNOSIS — R1011 Right upper quadrant pain: Secondary | ICD-10-CM | POA: Diagnosis not present

## 2024-03-11 DIAGNOSIS — E785 Hyperlipidemia, unspecified: Secondary | ICD-10-CM

## 2024-03-11 DIAGNOSIS — I10 Essential (primary) hypertension: Secondary | ICD-10-CM

## 2024-03-11 DIAGNOSIS — R112 Nausea with vomiting, unspecified: Secondary | ICD-10-CM | POA: Diagnosis not present

## 2024-03-11 DIAGNOSIS — G4489 Other headache syndrome: Secondary | ICD-10-CM

## 2024-03-11 DIAGNOSIS — F324 Major depressive disorder, single episode, in partial remission: Secondary | ICD-10-CM

## 2024-03-11 MED ORDER — EZETIMIBE 10 MG PO TABS: 10.0000 mg | ORAL_TABLET | Freq: Every day | ORAL | 3 refills | Status: AC

## 2024-03-11 MED ORDER — METHYLPREDNISOLONE ACETATE 80 MG/ML IJ SUSP
80.0000 mg | Freq: Once | INTRAMUSCULAR | Status: AC
  Administered 2024-03-11: 40 mg via INTRAMUSCULAR

## 2024-03-11 MED ORDER — ONDANSETRON HCL 4 MG/2ML IJ SOLN
4.0000 mg | Freq: Once | INTRAMUSCULAR | Status: AC
  Administered 2024-03-11: 4 mg via INTRAMUSCULAR

## 2024-03-11 MED ORDER — KETOROLAC TROMETHAMINE 60 MG/2ML IM SOLN
60.0000 mg | Freq: Once | INTRAMUSCULAR | Status: AC
  Administered 2024-03-11: 60 mg via INTRAMUSCULAR

## 2024-03-11 MED ORDER — ONDANSETRON HCL 4 MG PO TABS: 4.0000 mg | ORAL_TABLET | Freq: Three times a day (TID) | ORAL | 0 refills | Status: AC | PRN

## 2024-03-11 NOTE — Progress Notes (Signed)
   [name] [name]     MRN: [id number]      DOB: 1971/02/26  Chief Complaint  Patient presents with   Hypertension    4 month follow up    Emesis    Complains of vomiting since the weekend, as well as sinus pressure and ear ache. Denies fever    HPI [name] [name] is here for follow up and re-evaluation of chronic medical conditions, medication management and review of any available recent lab and radiology data.  She has been ill for the past week and this has progressively worsened. Symptoms are as stated. Denies fever , chills or body aches   ROS Denies recent fever or chills.  Denies chest congestion, productive cough or wheezing. Denies chest pains, palpitations and leg swelling Denies dysuria, frequency, hesitancy or incontinence. Denies joint pain, swelling and limitation in mobility. Denies headaches, seizures, numbness, or tingling. Denies depression, anxiety or insomnia. Denies skin break down or rash.   PE  BP 118/83   Pulse 96   Resp 18   Ht 5' 1 (1.549 m)   Wt 122 lb 0.6 oz (55.4 kg)   LMP 04/20/2018   SpO2 97%   BMI 23.06 kg/m   Patient alert, ill appearing , and oriented and in no cardiopulmonary distress.  HEENT: No facial asymmetry, EOMI,     Neck supple .TM clear bilaterally, no sinus tenderness, no excessive nasal congestion  Chest: Clear to auscultation bilaterally.  CVS: S1, S2 no murmurs, no S3.Regular rate.  ABD: Soft , epigastric RUQ tenderness, no guarding or rebound, no organomegaly, normal BS Ext: No edema  MS: Adequate ROM spine, shoulders, hips and knees.  Skin: Intact, no ulcerations or rash noted.  Psych: Good eye contact, normal affect. Memory intact not anxious or depressed appearing.  CNS: CN 2-12 intact, power,  normal throughout.no focal deficits noted.   Assessment & Plan  Headache Rated  at 8, was a 10 last week Toradol  60, depo medrol  40,  RUQ pain Progressively worsening in past 1 week, stat US  and lab data,  CBC, cmp, amylase and lipase  Nausea and vomiting Zofran  in office and prescribed Reviewed the  importance of remaining adequately hydrated, if unable ED, electrolytes repeated and requested stat also amylase andlipase  Benign essential HTN Controlled, no change in medication   Depression, major, in partial remission (HCC) Controlled, no change in medication   Hyperlipidemia LDL goal <100 Hyperlipidemia:Low fat diet discussed and encouraged.   Lipid Panel  Lab Results  Component Value Date   CHOL 250 (H) 12/01/2014   HDL 73 12/01/2014   LDLCALC 159 (H) 12/01/2014   TRIG 89 12/01/2014   CHOLHDL 3.4 12/01/2014     Start zetia

## 2024-03-11 NOTE — Assessment & Plan Note (Signed)
 Rated  at 8, was a 10 last week Toradol  60, depo medrol  40,

## 2024-03-11 NOTE — Patient Instructions (Addendum)
 F/U in mid September, call if you need me sooner  Need labs stat at hospital this am due to abdominal pain, cmp and EGFr, amylase and lipase and cbc and diff ( nurse pls order)  US  of your gall bladder is ordered on urgent basis based on symptoms today  Work excuse for 7/16 to return 03/13/2024, If still very unwell on 7/18 message me early in the am , I will extemnd to return 03/16/2024  For headache and allergies Toradol  60 mg IM and depomedrol 40 mg IM have been given'For nausea Zofran  4 mg Im is administered and I will also prescribe some, sent to [facility]   For high cholesterol additional medication zetia  is prescribed    Thanks for choosing [facility], we consider it a privelige to serve you.

## 2024-03-12 ENCOUNTER — Ambulatory Visit (HOSPITAL_COMMUNITY)

## 2024-03-13 ENCOUNTER — Telehealth: Payer: Self-pay | Admitting: Family Medicine

## 2024-03-13 ENCOUNTER — Telehealth: Payer: Self-pay

## 2024-03-13 MED ORDER — DIPHENOXYLATE-ATROPINE 2.5-0.025 MG PO TABS: 1.0000 | ORAL_TABLET | Freq: Four times a day (QID) | ORAL | 0 refills | Status: DC | PRN

## 2024-03-13 NOTE — Telephone Encounter (Signed)
 Noted, information given to Dr Antonetta

## 2024-03-13 NOTE — Telephone Encounter (Signed)
 Lvm to cb

## 2024-03-13 NOTE — Telephone Encounter (Signed)
 I recommend surgery consult for gallstone/ sludge in the gallbladder Blood count is good , kidney function is normal, pancreas enzymes are normal I will send med for v diarrhea, the  diarrhea is new, suggests gastroenteritis. NEEDS to stay hydrated I recommend that she  go to the  ED for eval and management if her symptoms worsen, or do not resolve in next 1 to 2 days, as she may need IV fluids, these  labs do not reflect how she is today as  far as hydration is concerned Pls see if she has a specific Surgeon she wants to see , I am sure will be at [facility] , just needs to give me a name, I will herfer her  Gallstones cause nausea and vomiting, but not diarrhea Pls extend work note to return on Monday If she is still unable then, she will need to go to the ED  for further evaluation,

## 2024-03-13 NOTE — Telephone Encounter (Signed)
 Copied from CRM [id number]. Topic: Clinical - Lab/Test Results >> Mar 13, 2024  2:01 PM Montie POUR wrote: Reason for CRM:  I read her the note dated 7/18 from Dr. Antonetta. She wants a referral sent to Dr. Ellaree Bolus is a Development worker, international aid. She had no questions at this time.

## 2024-03-13 NOTE — Telephone Encounter (Signed)
 Patient wanting to know if Dr Antonetta can please review her lab and US  results before the weekend to let her know the next steps says she was up last night with diarrhea and vomiting. Please advise Thank you

## 2024-03-14 ENCOUNTER — Ambulatory Visit: Payer: Self-pay | Admitting: Family Medicine

## 2024-03-14 DIAGNOSIS — K8080 Other cholelithiasis without obstruction: Secondary | ICD-10-CM

## 2024-03-16 ENCOUNTER — Encounter: Payer: Self-pay | Admitting: Family Medicine

## 2024-03-16 NOTE — Assessment & Plan Note (Signed)
 Hyperlipidemia:Low fat diet discussed and encouraged.   Lipid Panel  Lab Results  Component Value Date   CHOL 250 (H) 12/01/2014   HDL 73 12/01/2014   LDLCALC 159 (H) 12/01/2014   TRIG 89 12/01/2014   CHOLHDL 3.4 12/01/2014     Start zetia 

## 2024-03-16 NOTE — Assessment & Plan Note (Signed)
 Zofran  in office and prescribed Reviewed the  importance of remaining adequately hydrated, if unable ED, electrolytes repeated and requested stat also amylase andlipase

## 2024-03-16 NOTE — Assessment & Plan Note (Signed)
 Controlled, no change in medication

## 2024-03-16 NOTE — Assessment & Plan Note (Signed)
 Progressively worsening in past 1 week, stat US  and lab data, CBC, cmp, amylase and lipase

## 2024-03-17 ENCOUNTER — Telehealth: Payer: Self-pay

## 2024-03-17 NOTE — Telephone Encounter (Signed)
 Copied from CRM [id number]. Topic: Clinical - Prescription Issue >> Mar 17, 2024 12:16 PM Sasha H wrote: Reason for CRM: [facility] called in about the pt being prescribed FLUoxetine  (PROZAC ) 20 MG tablet, states there is a interaction with the medication she is already on, DULoxetine  (CYMBALTA ) 60 MG capsule. States it can cause serotonin syndrome and he needs clarification before filling it. He can be reached at [phone] ext [phone], his name is Darina

## 2024-03-17 NOTE — Telephone Encounter (Signed)
 Tried calling, they close at 4:30, will call tomorrow

## 2024-03-18 NOTE — Telephone Encounter (Signed)
 Pharmacist informed.

## 2024-03-31 ENCOUNTER — Encounter: Payer: Self-pay | Admitting: Family Medicine

## 2024-05-15 ENCOUNTER — Ambulatory Visit: Admitting: Family Medicine

## 2024-07-06 ENCOUNTER — Other Ambulatory Visit: Payer: Self-pay

## 2024-07-06 ENCOUNTER — Ambulatory Visit: Payer: Self-pay

## 2024-07-06 ENCOUNTER — Emergency Department (HOSPITAL_COMMUNITY)
Admission: EM | Admit: 2024-07-06 | Discharge: 2024-07-06 | Disposition: A | Discharge status: Home / Self Care | Attending: Emergency Medicine | Admitting: Emergency Medicine

## 2024-07-06 ENCOUNTER — Encounter (HOSPITAL_COMMUNITY): Payer: Self-pay

## 2024-07-06 DIAGNOSIS — H5712 Ocular pain, left eye: Secondary | ICD-10-CM | POA: Insufficient documentation

## 2024-07-06 DIAGNOSIS — Z79899 Other long term (current) drug therapy: Secondary | ICD-10-CM | POA: Diagnosis not present

## 2024-07-06 DIAGNOSIS — H53142 Visual discomfort, left eye: Secondary | ICD-10-CM | POA: Diagnosis not present

## 2024-07-06 DIAGNOSIS — I10 Essential (primary) hypertension: Secondary | ICD-10-CM | POA: Diagnosis not present

## 2024-07-06 DIAGNOSIS — Z7982 Long term (current) use of aspirin: Secondary | ICD-10-CM | POA: Insufficient documentation

## 2024-07-06 MED ORDER — TROPICAMIDE 1 % OP SOLN
1.0000 [drp] | Freq: Once | OPHTHALMIC | Status: AC
  Administered 2024-07-06: 1 [drp] via OPHTHALMIC
  Filled 2024-07-06: qty 3

## 2024-07-06 MED ORDER — TETRACAINE HCL 0.5 % OP SOLN
2.0000 [drp] | Freq: Once | OPHTHALMIC | Status: AC
  Administered 2024-07-06: 2 [drp] via OPHTHALMIC
  Filled 2024-07-06: qty 4

## 2024-07-06 MED ORDER — CYCLOPENTOLATE HCL 1 % OP SOLN: 1.0000 [drp] | Freq: Once | OPHTHALMIC | Status: DC

## 2024-07-06 NOTE — Discharge Instructions (Addendum)
 Dr. Octavia will see you tomorrow morning.  Go his office as early as you can get there, they open at 8 am.  Take the eye drop with you we gave you here.  You should not need an additional drop as this medicine will last for about 24 hours.  You will be light sensitive,  make sure you are wearing sunglasses whenever needed.

## 2024-07-06 NOTE — ED Provider Notes (Signed)
 [place] EMERGENCY DEPARTMENT AT [facility] Provider Note   CSN: [id number] Arrival date & time: 07/06/24  1153     Patient presents with: Headache   [name] [name] is a 53 y.o. 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  She was treated for this by an ophthalmologist in [place] who is since retired.  Her pain has been constant, improved in a darkened room but does not completely resolved.  Reports being extremely light sensitive. She denies fevers or chills, she does endorse chronic nausea not worsened since this pain began, denies head or eye injury.  She has found no alleviators for her symptoms.   The history is provided by the patient.       Prior to Admission medications   Medication Sig Start Date End Date Taking? Authorizing Provider  amLODipine  (NORVASC ) 2.5 MG tablet Take 1 tablet (2.5 mg total) by mouth daily. 01/02/24   Antonetta Rollene BRAVO, MD  amLODipine  (NORVASC ) 5 MG tablet Take 1 tablet (5 mg total) by mouth daily. 01/02/24   Antonetta Rollene BRAVO, MD  aspirin-acetaminophen -caffeine  (EXCEDRIN MIGRAINE) 250-250-65 MG tablet Take 1 tablet by mouth daily as needed for headache.    [provider]  busPIRone  (BUSPAR ) 10 MG tablet Take 1 tablet (10 mg total) by mouth 3 (three) times daily. 01/02/24   Antonetta Rollene BRAVO, MD  chlorpheniramine  (CHLOR-TRIMETON ) 4 MG tablet Take one tablet by mouth once daily as ,needed, excess nasal drainage 11/06/23   Antonetta Rollene BRAVO, MD  clonazePAM  (KLONOPIN ) 2 MG tablet Take half tablet by mouth at bedtime for anxiety and sleep 03/14/23   Antonetta Rollene BRAVO, MD  clonazePAM  (KLONOPIN ) 2 MG tablet Take half tablet at bedtime for anxiety and sleep  01/03/24   Antonetta Rollene BRAVO, MD  diphenoxylate -atropine  (LOMOTIL ) 2.5-0.025 MG tablet Take 1 tablet by mouth 4 (four) times daily as needed for diarrhea or loose stools. 03/13/24   Antonetta Rollene BRAVO, MD  DULoxetine  (CYMBALTA ) 60 MG capsule TAKE 1 CAPSULE(60 MG) BY MOUTH DAILY 01/02/24   Antonetta Rollene BRAVO, MD  ELDERBERRY PO Take 410 mg by mouth daily. Daily- zinc vitamin C combo    [provider]  ezetimibe  (ZETIA ) 10 MG tablet Take 1 tablet (10 mg total) by mouth daily. 03/11/24   Antonetta Rollene BRAVO, MD  FLUoxetine  (PROZAC ) 20 MG tablet Take 1 tablet (20 mg total) by mouth daily. 01/02/24   Antonetta Rollene BRAVO, MD  loratadine  (CLARITIN ) 10 MG tablet Take one tablet by mouth twice daily 01/02/24   Antonetta Rollene BRAVO, MD  montelukast  (SINGULAIR ) 10 MG tablet Take 1 tablet (10 mg total) by mouth at bedtime. 01/02/24   Antonetta Rollene BRAVO, MD  Multiple Vitamins-Calcium  (ONE-A-DAY WOMENS FORMULA PO) Take 1 capsule by mouth daily.    [provider]  omeprazole  (PRILOSEC) 40 MG capsule Take 1 capsule (40 mg total) by mouth daily. 01/02/24   Antonetta Rollene BRAVO, MD  ondansetron  (ZOFRAN ) 4 MG tablet Take 1 tablet (4 mg total) by mouth every 8 (eight) hours as needed for nausea or vomiting. 03/11/24   Antonetta Rollene BRAVO, MD  rosuvastatin  (CRESTOR ) 20 MG tablet Take 1 tablet (20 mg total) by mouth daily. 01/02/24   Antonetta Rollene BRAVO, MD  Allergies: Patient has no known allergies.    Review of Systems  Constitutional:  Negative for fever.  HENT:  Negative for congestion and sore throat.   Eyes:  Positive for photophobia, pain and visual disturbance.  Respiratory:  Negative for chest tightness and shortness of breath.   Cardiovascular:  Negative for chest pain.  Gastrointestinal:  Negative for abdominal pain and nausea.  Genitourinary: Negative.   Musculoskeletal:  Negative for arthralgias, joint swelling and neck pain.  Skin: Negative.  Negative for rash and wound.  Neurological:   Negative for dizziness, weakness, light-headedness, numbness and headaches.  Psychiatric/Behavioral: Negative.      Updated Vital Signs BP (!) 156/97 (BP Location: Right Arm)   Pulse 75   Temp 98.1 F (36.7 C) (Oral)   Resp 17   Ht 5' (1.524 m)   Wt 56.7 kg   LMP 04/20/2018   SpO2 96%   BMI 24.41 kg/m   Physical Exam Vitals and nursing note reviewed.  Constitutional:      Appearance: She is well-developed.  HENT:     Head: Normocephalic and atraumatic.  Eyes:     General: Lids are normal. Vision grossly intact.        Right eye: No discharge.        Left eye: No discharge.     Intraocular pressure: Left eye pressure is 10 mmHg. Measurements were taken using a handheld tonometer.    Extraocular Movements: Extraocular movements intact.     Left eye: No nystagmus.     Conjunctiva/sclera: Conjunctivae normal.     Left eye: Left conjunctiva is not injected. No chemosis.    Slit lamp exam:    Left eye: Photophobia present. No corneal flare or corneal ulcer.     Comments: Pt does not tolerate fundoscopic exam.  Consensual pain in left eye with light exposure to right eye. Pupils equally round, 3mm,  sluggish reaction.  Bilateral Distance: 20/20 R Distance: 20/20 L Distance: 20/25    Cardiovascular:     Rate and Rhythm: Normal rate.  Pulmonary:     Effort: Pulmonary effort is normal.     Breath sounds: Normal breath sounds.  Musculoskeletal:        General: Normal range of motion.     Cervical back: Normal range of motion.  Skin:    General: Skin is warm and dry.  Neurological:     Mental Status: She is alert.     (all labs ordered are listed, but only abnormal results are displayed) Labs Reviewed - No data to display  EKG: None  Radiology: No results found.   Procedures   Medications Ordered in the ED  tetracaine (PONTOCAINE) 0.5 % ophthalmic solution 2 drop (2 drops Left Eye Given 07/06/24 1635)  tropicamide (MYDRIACYL) 1 % ophthalmic solution 1 drop (1  drop Left Eye Given 07/06/24 1634)                                    Medical Decision Making Patient presenting with left eye pain along with photophobia for the past 3 days, no injuries.  She reports history years ago of similar symptoms and was treated by ophthalmologist for acute iritis.  She denies injuries, denies fevers, no nausea or vomiting.  She also denies sinus complaints.  Her exam is reassuring with a normal pressure and normal visual acuity.  She does have consensual pain suggesting iritis or  uveitis, however she has no conjunctival injection, she is not tolerate a funduscopic exam but her anterior chamber does not appear cloudy, no hyphema or other anterior chamber findings although exam is somewhat limited secondary to intolerance to light.  Amount and/or Complexity of Data Reviewed Discussion of management or test interpretation with external provider(s): Patient was discussed with Dr. Octavia who will see patient tomorrow morning in his office, in the interim he does agree with the cycloplegic, recommend Cyclogyl, unfortunately this was not available in our pharmacy, this was substituted with Mydriacyl.  Patient understands plan to see Dr. Octavia tomorrow morning, she has arranged for a ride to help her get there tomorrow.        Final diagnoses:  Pain of left eye  Photophobia of left eye    ED Discharge Orders     None          Birdena Mliss RIGGERS 07/06/24 1650    Suzette Pac, MD 07/07/24 [phone]

## 2024-07-06 NOTE — ED Triage Notes (Signed)
 Pt states headache for 3 days, feeling fatigued, and feels like vision is blurred. Pt states calling her PCP and was informed to come to the ED. Pt states hx of sinus issues.

## 2024-07-06 NOTE — Telephone Encounter (Signed)
 Spoke to pt, agreeable to ED

## 2024-07-06 NOTE — Telephone Encounter (Signed)
 FYI Only or Action Required?: Action required by provider: request for appointment and clinical question for provider.  Patient was last seen in primary care on 03/11/2024 by Antonetta Rollene BRAVO, MD.  Called Nurse Triage reporting Headache (/).  Symptoms began several days ago.  Interventions attempted: Rest, hydration, or home remedies.  Symptoms are: .rapidly worsening  Triage Disposition: Go to ED Now (Notify PCP)  Patient/caregiver understands and will follow disposition?: Yes   Copied from CRM [id number]. Topic: Clinical - Red Word Triage >> Jul 06, 2024 10:19 AM Ahlexyia S wrote: Red Word that prompted transfer to Nurse Triage: Pt called in stating that her head has been hurting for the past 3 days. Pt also mentioned that her left eye is very blurred and experiencing very frequent fatigue. Warm transferred to nurse triage.   Reason for Disposition  Loss of vision or double vision  (Exception: Same as previously diagnosed migraines.)    Complaints of blurred vision in the left eye on yesterday. This would be the second day out of three days of initial onset of the headaches.  Answer Assessment - Initial Assessment Questions Patient recently stopped taking blood pressure medication, Amlodipine  on last office visit, has a history of Hypertension.   Inquired about regular monitoring of blood pressure and recent readings; stated she didn't have the one she'd taken last week with her currently.   Advised ED based on triage, nursing assessment, and information given by the patient; the patient adamantly refused the recommended disposition, and stated they can do imaging in the office tomorrow if need be. Contacted [facility] and spoke with Brittney, whom attempted to get a clinical representative, informed this clinical representative of situation and refusal of disposition. PCP is aware, and the clinical representative will continue to facilitate continuity of care going forward.   1.  LOCATION: Where does it hurt?      Pressure, Throbbing, Unilateral, Left Side  2. ONSET: When did the headache start? (e.g., minutes, hours, days)      3 days ago  3. PATTERN: Does the pain come and go, or has it been constant since it started?     Constant  4. SEVERITY: How bad is the pain? and What does it keep you from doing?  (e.g., Scale 1-10; mild, moderate, or severe)     Throbbing, Severe  5. RECURRENT SYMPTOM: Have you ever had headaches before? If Yes, ask: When was the last time? and What happened that time?      No  6. CAUSE: What do you think is causing the headache?     Unsure  7. MIGRAINE: Have you been diagnosed with migraine headaches? If Yes, ask: Is this headache similar?      Yes, previously at a younger age  25. HEAD INJURY: Has there been any recent injury to your head?      No  9. OTHER SYMPTOMS: Do you have any other symptoms? (e.g., fever, stiff neck, eye pain, sore throat, cold symptoms)     Fatigue, Blurred Vision (Left Eye), on the 2nd day of Three  10. PREGNANCY: Is there any chance you are pregnant? When was your last menstrual period?       No  Protocols used: Headache-A-AH

## 2024-07-13 ENCOUNTER — Encounter: Payer: Self-pay | Admitting: Family Medicine

## 2024-07-13 ENCOUNTER — Ambulatory Visit (INDEPENDENT_AMBULATORY_CARE_PROVIDER_SITE_OTHER): Admitting: Family Medicine

## 2024-07-13 VITALS — BP 129/92 | HR 88 | Resp 18 | Ht 60.0 in | Wt 126.1 lb

## 2024-07-13 DIAGNOSIS — R519 Headache, unspecified: Secondary | ICD-10-CM

## 2024-07-13 DIAGNOSIS — K219 Gastro-esophageal reflux disease without esophagitis: Secondary | ICD-10-CM

## 2024-07-13 DIAGNOSIS — R2681 Unsteadiness on feet: Secondary | ICD-10-CM

## 2024-07-13 DIAGNOSIS — R1310 Dysphagia, unspecified: Secondary | ICD-10-CM | POA: Diagnosis not present

## 2024-07-13 DIAGNOSIS — R110 Nausea: Secondary | ICD-10-CM

## 2024-07-13 DIAGNOSIS — F321 Major depressive disorder, single episode, moderate: Secondary | ICD-10-CM

## 2024-07-13 DIAGNOSIS — I10 Essential (primary) hypertension: Secondary | ICD-10-CM

## 2024-07-13 NOTE — Patient Instructions (Signed)
 F/U in 10 to 12 weeks  Nurse BP check in 3 weeks  Take amlodipine  5 mg one every day for blood pressure  You need to see Surgeon re gallstone with dAILY NAUSEA  YOU NEED TO SEE gi dOC WITH DYSPHAGIA AND UNCONTROLLED REFLUX  I AM REFERRING YOU FRO BRAIN SCAN SINCE YOU REPORT UNSTAEDY GAIT AND INCREASED HEADACHE FREQUENCY AND SEVERITY IN PAST 6 MONTHS  PLEASE CONSIDER THERAPY THROUGH YOUR JOB, DUE TO INCREASED STRESSES THAT YOU ARE HAVING IN YOUR PERSONAL LIFE ESPECIALLY AS IT INVOLVES ILLNESS IN THOSE NEAR AND DEar to you  Mammogrm is due Dec 24 or after plkease schedule to get this done if you do not already have an ppt for it

## 2024-07-13 NOTE — Progress Notes (Signed)
   [name] [name]     MRN: [id number]      DOB: September 30, 1970  Chief Complaint  Patient presents with   Hypertension    Follow up     HPI [name] [name] is here for follow up OF ed VISIT on 07/06/2024 when she was dx with iritis, which she approx 5 years ago. Still experiencing pain and blurry vision in left eye, on steroid drops to eye and has f/u with ophthalmology this week Approx 6 month h/o increased headache frequency and unsteady gait, denies vertigo Increased stress as boyfriend recently dx with prostate cancer and a close girlfriend with ovarian cancer Chronic nausea, did not keep appt with surgery re gallstones and also reports solid dysphagia in the past 6 months ROS Denies recent fever or chills. Denies sinus pressure, nasal congestion, ear pain or sore throat. Denies chest congestion, productive cough or wheezing. Denies chest pains, palpitations and leg swelling  Denies dysuria, frequency, hesitancy or incontinence. Denies joint pain, swelling and limitation in mobility. Denies skin break down or rash.   PE  BP (!) 129/92   Pulse 88   Resp 18   Ht 5' (1.524 m)   Wt 126 lb 1.9 oz (57.2 kg)   LMP 04/20/2018   SpO2 96%   BMI 24.63 kg/m   Patient alert and oriented and in no cardiopulmonary distress.  HEENT: No facial asymmetry, EOMI,     Neck supple .  Chest: Clear to auscultation bilaterally.  CVS: S1, S2 no murmurs, no S3.Regular rate.  Abd; mild epigastric tenderness to deep palpation, no guarding or rebound , no masses or organomegaly.   Ext: No edema  MS: Adequate ROM spine, shoulders, hips and knees.  Skin: Intact, no ulcerations or rash noted.  Psych: Good eye contact, flat affect. Memory intact  anxious and  depressed appearing.  CNS: CN 2-12 intact, power,  normal throughout.no focal deficits noted.   Assessment & Plan  Headache, worsening Increased frequency and severity of headaches woith vision disturbance obtain brain scan and refer  to Neurology   Dysphagia Solid dysphagia intermittently x 6 months  Chronic nausea 6 month history, has  GERD and gallstones , was referred to surgery , needs to follow up on this  Benign essential HTN Controlled, no change in medication'  GERD (gastroesophageal reflux disease) Uncontrolled needs GI eval  Depression, major, single episode, moderate (HCC) Increased symptoms due to illness in herself, her partner and a close friend . Not homicidal or suicidal. Therapy through her jobi s recommended.Continue medications prescribed

## 2024-07-15 ENCOUNTER — Telehealth: Payer: Self-pay | Admitting: Family Medicine

## 2024-07-15 NOTE — Telephone Encounter (Signed)
 Noted

## 2024-07-15 NOTE — Telephone Encounter (Signed)
 FYI appointment has been cancelled per patient request.   Copied from CRM #[id number]. Topic: General - Other >> Jul 15, 2024 10:30 AM Victoria B wrote: Reason for CRM: Patient called in states she wants to cancel appt for bp check on 12/15 because she works at [employer]  and she can have it done there and she will send in the results. She will check is 3 times

## 2024-07-16 ENCOUNTER — Telehealth: Payer: Self-pay

## 2024-07-16 DIAGNOSIS — K219 Gastro-esophageal reflux disease without esophagitis: Secondary | ICD-10-CM | POA: Insufficient documentation

## 2024-07-16 NOTE — Telephone Encounter (Signed)
 Copied from CRM #[id number]. Topic: Clinical - Request for Lab/Test Order >> Jul 16, 2024  9:02 AM Winona R wrote: Pt states she was suppose to have a order for an MRI of the brain and she would like for it to be sent to her or faxed so she can have it done where she works, [employer] Fax : [phone]

## 2024-07-16 NOTE — Telephone Encounter (Signed)
 Copied from CRM #[id number]. Topic: Referral - Question >> Jul 16, 2024  9:11 AM Tonda B wrote: Reason for CRM: patient is calling have questions about her referral please call pt back [phone]

## 2024-07-20 ENCOUNTER — Telehealth: Payer: Self-pay

## 2024-07-20 NOTE — Telephone Encounter (Signed)
 Copied from CRM #[id number]. Topic: Clinical - Medication Prior Auth >> Jul 20, 2024  2:31 PM Darshell M wrote: Reason for CRM: Patient schedule for the CT of Brain without (CPT [id number]) 07/22/2024. No prior auth on file for the test. Grayce [place]ST[place],  [phone].

## 2024-07-21 DIAGNOSIS — R519 Headache, unspecified: Secondary | ICD-10-CM | POA: Insufficient documentation

## 2024-07-21 DIAGNOSIS — R110 Nausea: Secondary | ICD-10-CM | POA: Insufficient documentation

## 2024-07-21 DIAGNOSIS — R2681 Unsteadiness on feet: Secondary | ICD-10-CM | POA: Insufficient documentation

## 2024-07-21 NOTE — Telephone Encounter (Signed)
 Patient's MRI is under Clinical Review at this time.  Once I get a decision from Insurance - I will provide auth information to [facility].

## 2024-07-21 NOTE — Telephone Encounter (Signed)
 Lvm for robin to [facility] to make sure they ordered MRI

## 2024-07-21 NOTE — Telephone Encounter (Signed)
 MRI date is 11/26

## 2024-07-21 NOTE — Assessment & Plan Note (Addendum)
 Increased frequency and severity of headaches woith vision disturbance obtain brain scan and refer to Neurology

## 2024-07-21 NOTE — Telephone Encounter (Signed)
 Pls contact pt, get date of her brain scan documented, also advise her that I referred her to a [employer], so expect a call from that office with appt, this is because of c/o increased headache and unsteady gait Please also get and document her appt info re seeing the surgeon in [place] about gallstones She is referred to GI in [place] for difficulty swallowing and should get appt info soon

## 2024-07-21 NOTE — Assessment & Plan Note (Signed)
 Solid dysphagia intermittently x 6 months

## 2024-07-21 NOTE — Assessment & Plan Note (Signed)
 6 month history, has  GERD and gallstones , was referred to surgery , needs to follow up on this

## 2024-07-21 NOTE — Telephone Encounter (Signed)
 Spoke to robin at [facility], they confirmed it is for MRI. States she accidentally said CT. Pt is scheduled for tmr needing prior auth. Per Grayce, can leave authorization information on her secure vm [phone]

## 2024-07-21 NOTE — Telephone Encounter (Signed)
 We did not order a CT for this Patient.

## 2024-07-21 NOTE — Assessment & Plan Note (Signed)
 Uncontrolled needs GI eval

## 2024-07-21 NOTE — Assessment & Plan Note (Signed)
 Controlled, no change in medication

## 2024-07-21 NOTE — Assessment & Plan Note (Signed)
 Increased symptoms due to illness in herself, her partner and a close friend . Not homicidal or suicidal. Therapy through her jobi s recommended.Continue medications prescribed

## 2024-08-03 ENCOUNTER — Ambulatory Visit

## 2024-08-03 ENCOUNTER — Telehealth: Payer: Self-pay | Admitting: Family Medicine

## 2024-08-03 NOTE — Telephone Encounter (Unsigned)
 Copied from CRM [id number]. Topic: Clinical - Medication Refill >> Aug 03, 2024  3:32 PM Fonda T wrote: Medication: clonazePAM  (KLONOPIN ) 2 MG tablet amLODipine  (NORVASC ) 5 MG tablet  Has the patient contacted their pharmacy? Yes, advised to contact office   This is the patient's preferred pharmacy:  [facility] [place], [place] - [address] [place] [address] Phone: [phone] Fax: [phone]   Is this the correct pharmacy for this prescription? Yes If no, delete pharmacy and type the correct one.   Has the prescription been filled recently? Yes  Is the patient out of the medication? Yes  Has the patient been seen for an appointment in the last year OR does the patient have an upcoming appointment? Yes  Can we respond through MyChart? No, prefers phone call [phone]    Agent: Please be advised that Rx refills may take up to 3 business days. We ask that you follow-up with your pharmacy.

## 2024-08-04 ENCOUNTER — Ambulatory Visit: Admitting: Family Medicine

## 2024-08-04 ENCOUNTER — Other Ambulatory Visit: Payer: Self-pay | Admitting: Family Medicine

## 2024-08-04 MED ORDER — CLONAZEPAM 2 MG PO TABS: ORAL_TABLET | ORAL | 5 refills | Status: AC

## 2024-08-04 MED ORDER — AMLODIPINE BESYLATE 5 MG PO TABS: 5.0000 mg | ORAL_TABLET | Freq: Every day | ORAL | 1 refills | Status: AC

## 2024-08-10 ENCOUNTER — Ambulatory Visit

## 2024-08-26 LAB — HM MAMMOGRAPHY

## 2024-08-31 ENCOUNTER — Encounter: Payer: Self-pay | Admitting: Family Medicine

## 2024-10-02 ENCOUNTER — Ambulatory Visit: Admitting: Family Medicine

## 2024-10-26 ENCOUNTER — Ambulatory Visit: Admitting: Family Medicine

## 2024-12-02 ENCOUNTER — Ambulatory Visit: Admitting: Neurology
